# Patient Record
Sex: Male | Born: 1940 | Race: White | Hispanic: No | Smoking: Former smoker
Health system: Southern US, Community
[De-identification: ages and names within clinical notes are randomized; demographics above are authoritative.]

## PROBLEM LIST (undated history)

## (undated) DIAGNOSIS — E785 Hyperlipidemia, unspecified: Secondary | ICD-10-CM

## (undated) DIAGNOSIS — N210 Calculus in bladder: Secondary | ICD-10-CM

## (undated) DIAGNOSIS — K219 Gastro-esophageal reflux disease without esophagitis: Secondary | ICD-10-CM

## (undated) DIAGNOSIS — K8020 Calculus of gallbladder without cholecystitis without obstruction: Secondary | ICD-10-CM

## (undated) DIAGNOSIS — C801 Malignant (primary) neoplasm, unspecified: Secondary | ICD-10-CM

## (undated) DIAGNOSIS — C44221 Squamous cell carcinoma of skin of unspecified ear and external auricular canal: Secondary | ICD-10-CM

## (undated) DIAGNOSIS — I219 Acute myocardial infarction, unspecified: Secondary | ICD-10-CM

## (undated) DIAGNOSIS — J189 Pneumonia, unspecified organism: Secondary | ICD-10-CM

## (undated) DIAGNOSIS — N289 Disorder of kidney and ureter, unspecified: Secondary | ICD-10-CM

## (undated) DIAGNOSIS — Z87442 Personal history of urinary calculi: Secondary | ICD-10-CM

## (undated) DIAGNOSIS — I82409 Acute embolism and thrombosis of unspecified deep veins of unspecified lower extremity: Secondary | ICD-10-CM

## (undated) DIAGNOSIS — I10 Essential (primary) hypertension: Secondary | ICD-10-CM

## (undated) DIAGNOSIS — I83899 Varicose veins of unspecified lower extremities with other complications: Secondary | ICD-10-CM

## (undated) DIAGNOSIS — R21 Rash and other nonspecific skin eruption: Secondary | ICD-10-CM

## (undated) DIAGNOSIS — I2510 Atherosclerotic heart disease of native coronary artery without angina pectoris: Secondary | ICD-10-CM

## (undated) HISTORY — PX: COLONOSCOPY: SHX174

## (undated) HISTORY — DX: Acute myocardial infarction, unspecified: I21.9

## (undated) HISTORY — DX: Atherosclerotic heart disease of native coronary artery without angina pectoris: I25.10

## (undated) HISTORY — PX: DOPPLER ECHOCARDIOGRAPHY: SHX263

## (undated) HISTORY — DX: Essential (primary) hypertension: I10

## (undated) HISTORY — DX: Hyperlipidemia, unspecified: E78.5

## (undated) SURGERY — Surgical Case
Anesthesia: *Unknown

---

## 1997-11-10 ENCOUNTER — Emergency Department (HOSPITAL_COMMUNITY): Admission: EM | Admit: 1997-11-10 | Discharge: 1997-11-10 | Payer: Self-pay | Admitting: Emergency Medicine

## 2001-09-20 ENCOUNTER — Emergency Department (HOSPITAL_COMMUNITY): Admission: EM | Admit: 2001-09-20 | Discharge: 2001-09-20 | Payer: Self-pay | Admitting: Emergency Medicine

## 2001-10-08 ENCOUNTER — Emergency Department (HOSPITAL_COMMUNITY): Admission: EM | Admit: 2001-10-08 | Discharge: 2001-10-08 | Payer: Self-pay | Admitting: Emergency Medicine

## 2001-10-08 ENCOUNTER — Encounter: Payer: Self-pay | Admitting: Emergency Medicine

## 2003-05-14 ENCOUNTER — Emergency Department (HOSPITAL_COMMUNITY): Admission: AD | Admit: 2003-05-14 | Discharge: 2003-05-14 | Payer: Self-pay | Admitting: Family Medicine

## 2003-08-23 ENCOUNTER — Emergency Department (HOSPITAL_COMMUNITY): Admission: EM | Admit: 2003-08-23 | Discharge: 2003-08-23 | Payer: Self-pay | Admitting: *Deleted

## 2003-09-07 ENCOUNTER — Emergency Department (HOSPITAL_COMMUNITY): Admission: EM | Admit: 2003-09-07 | Discharge: 2003-09-08 | Payer: Self-pay | Admitting: Emergency Medicine

## 2003-11-16 ENCOUNTER — Emergency Department (HOSPITAL_COMMUNITY): Admission: EM | Admit: 2003-11-16 | Discharge: 2003-11-16 | Payer: Self-pay | Admitting: Family Medicine

## 2005-08-16 ENCOUNTER — Emergency Department (HOSPITAL_COMMUNITY): Admission: EM | Admit: 2005-08-16 | Discharge: 2005-08-17 | Payer: Self-pay | Admitting: Emergency Medicine

## 2005-09-09 ENCOUNTER — Emergency Department (HOSPITAL_COMMUNITY): Admission: EM | Admit: 2005-09-09 | Discharge: 2005-09-09 | Payer: Self-pay | Admitting: Emergency Medicine

## 2006-03-16 ENCOUNTER — Emergency Department (HOSPITAL_COMMUNITY): Admission: EM | Admit: 2006-03-16 | Discharge: 2006-03-16 | Payer: Self-pay | Admitting: *Deleted

## 2006-08-12 ENCOUNTER — Emergency Department (HOSPITAL_COMMUNITY): Admission: EM | Admit: 2006-08-12 | Discharge: 2006-08-12 | Payer: Self-pay | Admitting: Emergency Medicine

## 2007-03-20 ENCOUNTER — Emergency Department (HOSPITAL_COMMUNITY): Admission: EM | Admit: 2007-03-20 | Discharge: 2007-03-20 | Payer: Self-pay | Admitting: Emergency Medicine

## 2007-10-16 ENCOUNTER — Emergency Department (HOSPITAL_COMMUNITY): Admission: EM | Admit: 2007-10-16 | Discharge: 2007-10-16 | Payer: Self-pay | Admitting: Emergency Medicine

## 2008-05-13 HISTORY — PX: CORONARY ANGIOPLASTY: SHX604

## 2009-03-31 ENCOUNTER — Emergency Department (HOSPITAL_COMMUNITY): Admission: EM | Admit: 2009-03-31 | Discharge: 2009-03-31 | Payer: Self-pay | Admitting: Emergency Medicine

## 2010-01-07 ENCOUNTER — Other Ambulatory Visit: Payer: Self-pay | Admitting: Emergency Medicine

## 2010-01-08 ENCOUNTER — Ambulatory Visit: Payer: Self-pay | Admitting: Cardiology

## 2010-01-08 ENCOUNTER — Inpatient Hospital Stay (HOSPITAL_COMMUNITY): Admission: EM | Admit: 2010-01-08 | Discharge: 2010-01-10 | Payer: Self-pay | Admitting: Emergency Medicine

## 2010-01-09 ENCOUNTER — Encounter: Payer: Self-pay | Admitting: Internal Medicine

## 2010-01-13 ENCOUNTER — Emergency Department (HOSPITAL_COMMUNITY): Admission: EM | Admit: 2010-01-13 | Discharge: 2010-01-14 | Payer: Self-pay | Admitting: Emergency Medicine

## 2010-01-18 ENCOUNTER — Ambulatory Visit: Payer: Self-pay | Admitting: Cardiovascular Disease

## 2010-01-18 DIAGNOSIS — E782 Mixed hyperlipidemia: Secondary | ICD-10-CM

## 2010-01-18 DIAGNOSIS — I2510 Atherosclerotic heart disease of native coronary artery without angina pectoris: Secondary | ICD-10-CM

## 2010-01-18 DIAGNOSIS — R5381 Other malaise: Secondary | ICD-10-CM

## 2010-01-18 DIAGNOSIS — R5383 Other fatigue: Secondary | ICD-10-CM

## 2010-01-18 DIAGNOSIS — F17200 Nicotine dependence, unspecified, uncomplicated: Secondary | ICD-10-CM | POA: Insufficient documentation

## 2010-01-25 ENCOUNTER — Telehealth (INDEPENDENT_AMBULATORY_CARE_PROVIDER_SITE_OTHER): Payer: Self-pay | Admitting: *Deleted

## 2010-01-26 ENCOUNTER — Telehealth: Payer: Self-pay | Admitting: Cardiovascular Disease

## 2010-01-30 ENCOUNTER — Telehealth: Payer: Self-pay | Admitting: Cardiovascular Disease

## 2010-01-31 ENCOUNTER — Encounter: Payer: Self-pay | Admitting: Cardiovascular Disease

## 2010-01-31 ENCOUNTER — Ambulatory Visit: Payer: Self-pay | Admitting: Cardiology

## 2010-01-31 DIAGNOSIS — R030 Elevated blood-pressure reading, without diagnosis of hypertension: Secondary | ICD-10-CM

## 2010-02-02 ENCOUNTER — Ambulatory Visit: Payer: Self-pay | Admitting: Cardiology

## 2010-02-02 DIAGNOSIS — I10 Essential (primary) hypertension: Secondary | ICD-10-CM

## 2010-02-05 ENCOUNTER — Encounter (HOSPITAL_COMMUNITY): Admission: RE | Admit: 2010-02-05 | Discharge: 2010-02-09 | Payer: Self-pay | Admitting: Cardiovascular Disease

## 2010-02-07 ENCOUNTER — Telehealth (INDEPENDENT_AMBULATORY_CARE_PROVIDER_SITE_OTHER): Payer: Self-pay

## 2010-02-08 ENCOUNTER — Telehealth (INDEPENDENT_AMBULATORY_CARE_PROVIDER_SITE_OTHER): Payer: Self-pay

## 2010-02-10 ENCOUNTER — Encounter (HOSPITAL_COMMUNITY)
Admission: RE | Admit: 2010-02-10 | Discharge: 2010-03-12 | Payer: Self-pay | Source: Home / Self Care | Admitting: Cardiovascular Disease

## 2010-02-16 ENCOUNTER — Ambulatory Visit: Payer: Self-pay | Admitting: Cardiology

## 2010-02-16 DIAGNOSIS — R1084 Generalized abdominal pain: Secondary | ICD-10-CM

## 2010-02-20 ENCOUNTER — Telehealth (INDEPENDENT_AMBULATORY_CARE_PROVIDER_SITE_OTHER): Payer: Self-pay

## 2010-02-27 ENCOUNTER — Telehealth: Payer: Self-pay | Admitting: Cardiovascular Disease

## 2010-03-01 ENCOUNTER — Emergency Department (HOSPITAL_COMMUNITY): Admission: EM | Admit: 2010-03-01 | Discharge: 2010-03-01 | Payer: Self-pay | Admitting: Family Medicine

## 2010-03-02 ENCOUNTER — Telehealth (INDEPENDENT_AMBULATORY_CARE_PROVIDER_SITE_OTHER): Payer: Self-pay | Admitting: *Deleted

## 2010-03-10 ENCOUNTER — Emergency Department (HOSPITAL_COMMUNITY): Admission: EM | Admit: 2010-03-10 | Discharge: 2010-03-10 | Payer: Self-pay | Admitting: Emergency Medicine

## 2010-03-12 ENCOUNTER — Telehealth: Payer: Self-pay | Admitting: Cardiovascular Disease

## 2010-03-14 ENCOUNTER — Encounter (HOSPITAL_COMMUNITY)
Admission: RE | Admit: 2010-03-14 | Discharge: 2010-04-13 | Payer: Self-pay | Source: Home / Self Care | Admitting: Cardiovascular Disease

## 2010-03-16 ENCOUNTER — Encounter: Payer: Self-pay | Admitting: Adult Health

## 2010-03-16 LAB — CONVERTED CEMR LAB
ALT: <8 units/L (ref 0–53)
Albumin: 4.3 g/dL (ref 3.5–5.2)
Cholesterol: 200 mg/dL (ref 0–200)
HDL: 49 mg/dL (ref >39)
LDL Cholesterol: 131 mg/dL — ABNORMAL HIGH (ref 0–99)
Total CHOL/HDL Ratio: 4.1
Total Protein: 7.2 g/dL (ref 6.0–8.3)
Triglycerides: 99 mg/dL (ref <150)
VLDL: 20 mg/dL (ref 0–40)

## 2010-03-19 ENCOUNTER — Encounter: Payer: Self-pay | Admitting: Cardiovascular Disease

## 2010-03-20 ENCOUNTER — Ambulatory Visit: Payer: Self-pay | Admitting: Cardiovascular Disease

## 2010-03-22 ENCOUNTER — Encounter: Payer: Self-pay | Admitting: Cardiovascular Disease

## 2010-03-26 ENCOUNTER — Telehealth (INDEPENDENT_AMBULATORY_CARE_PROVIDER_SITE_OTHER): Payer: Self-pay

## 2010-04-09 ENCOUNTER — Emergency Department (HOSPITAL_COMMUNITY): Admission: EM | Admit: 2010-04-09 | Discharge: 2010-04-09 | Payer: Self-pay | Admitting: Emergency Medicine

## 2010-04-13 ENCOUNTER — Telehealth (INDEPENDENT_AMBULATORY_CARE_PROVIDER_SITE_OTHER): Payer: Self-pay | Admitting: *Deleted

## 2010-04-13 ENCOUNTER — Encounter: Payer: Self-pay | Admitting: Cardiovascular Disease

## 2010-04-13 ENCOUNTER — Encounter (HOSPITAL_COMMUNITY)
Admission: RE | Admit: 2010-04-13 | Discharge: 2010-05-13 | Payer: Self-pay | Source: Home / Self Care | Attending: Cardiovascular Disease | Admitting: Cardiovascular Disease

## 2010-04-17 DIAGNOSIS — M159 Polyosteoarthritis, unspecified: Secondary | ICD-10-CM | POA: Insufficient documentation

## 2010-04-23 ENCOUNTER — Telehealth (INDEPENDENT_AMBULATORY_CARE_PROVIDER_SITE_OTHER): Payer: Self-pay | Admitting: *Deleted

## 2010-05-01 ENCOUNTER — Telehealth: Payer: Self-pay | Admitting: Cardiovascular Disease

## 2010-06-12 NOTE — Progress Notes (Signed)
Summary: Refill  Phone Note Call from Patient   Caller: Patient Reason for Call: Refill Medication Summary of Call: patient needs refill on Effient and Crestor called into [facility]/tg Initial call taken by: Raechel Ache [facility],  February 07, 2010 1:25 PM  Follow-up for Phone Call        RX's at [facility]. waiting for pick up. Pt. aware. Follow-up by: Larita Fife Via LPN,  February 07, 2010 2:10 PM

## 2010-06-12 NOTE — Assessment & Plan Note (Signed)
Summary: per Wynona Canes @ [place] office/per Dr.Nishan/tg   Visit Type:  Follow-up Primary Provider:  None   History of Present Illness: 70 year old male, patient of Dr. Eden Emms, added onto my schedule today to followup on recent symptoms. Recent phone notes as well as discharge summary and office followup reviewed.  He is status post BMS to RCA in the setting of an ACS, preceded by description of throat "pressure" as if were "closing up." He tolerated this well. Does have residual disease, most significant in the distal RCA.  He reports having some recurrent symptoms since discharge. He specifically states that after he takes his Toprol-XL in the morning, he begins to feel dizzy, and then experiences a different sensation than pre-intervention, with a "funny feeling" in his upper chest to lower throat area. During the rest of the day, he begins to feel better, and specifically does not endorse any exertional neck discomfort with activity. He presented to cardiac rehabilitation the other day, found to be significantly hypertensive, although had not yet taken his Toprol-XL. He is noted to be bradycardic at rest.  Otherwise he reports compliance with dual antiplatelet therapy. He states that he has generally been feeling better over the last 2 days. No cough, fevers chills, or hemoptysis.  Current Medications (verified): 1)  Aspir-Low 81 Mg Tbec (Aspirin) .... Take 1 Tab Daily 2)  Metoprolol Succinate 25 Mg Xr24h-Tab (Metoprolol Succinate) .... Take 1/2 Tablet By Mouth Once Daily 3)  Crestor 20 Mg Tabs (Rosuvastatin Calcium) .... Take 1 Tab Daily 4)  Effient 10 Mg Tabs (Prasugrel Hcl) .... Take 1 Tab Daily 5)  Nitrostat 0.4 Mg Subl (Nitroglycerin) .... Take As Needed For Chestpain 6)  Norvasc 2.5 Mg Tabs (Amlodipine Besylate) .... Take 1 Tablet By Mouth Once Daily  Allergies (verified): 1)  ! Penicillin  Past History:  Social History: Last updated: 02/02/2010 Full Time Married  Tobacco  Use - Yes Alcohol Use - no Regular Exercise - no Drug Use - no  Past Medical History: CAD - BMS RCA 8/11, LVEF 50-55% Hyperlipidemia Hypertension Myocardial Infarction - NSTEMI 8/11 Gout  Past Surgical History: Unremarkable  Family History: Father: deceased age 53 unknown causes Mother: deceased at sge 64 unknown causes  Social History: Full Time Married  Tobacco Use - Yes Alcohol Use - no Regular Exercise - no Drug Use - no  Review of Systems  The patient denies anorexia, fever, chest pain, syncope, peripheral edema, prolonged cough, melena, and hematochezia.         Some bruising on his forearms, no major bleeding problems. Otherwise reviewed and negative.  Vital Signs:  Patient profile:   70 year old male Weight:      159 pounds BMI:     22.90 Pulse rate:   56 / minute BP sitting:   150 / 66  (right arm)  Vitals Entered By: Dreama Saa, CNA (February 02, 2010 1:05 PM)  Physical Exam  Additional Exam:  Normally nourished appearing male in no acute distress. HEENT: Conjunctiva and lids normal, oropharynx clear with poor dentition. Neck: Supple, no elevated JVP or loud bruits. Lungs: Clear to auscultation, diminished, no wheezing. Cardiac: Regular rate and rhythm, no S3 or pericardial rub. Abdomen: Soft, nontender, bowel sounds present. Skin: Warm and dry, few ecchymoses on the forearms. Extremities: No pitting edema. Musculoskeletal: No kyphosis. Neuropsychiatric: Alert and oriented x3, affect appropriate.   Cardiac Cath  Procedure date:  01/09/2010  Findings:      Left main was normal.  LAD was a moderate-sized vessel gave off a large diagonal branch.  There   was a diffuse 30-40% stenosis throughout the mid-to-distal LAD.  In the   ostium of the diagonal, there was approximately 40% stenosis.      Left circumflex gave off a large OM-1 and a small OM-2.  There was 20%   lesion in the proximal AV groove circumflex and a 50% lesion in the    proximal portion of the OM-1.      Right coronary artery was a large dominant vessel, had a 40% lesion   proximally followed by a 99% ruptured plaque in the proximal to   midvessel, this was followed by a 40% lesion distally.  There was a 70%   lesion in the distal RCA just after the takeoff of the PDA.  In the mid   PDA, there was a 60% lesion.  These were not flow-limiting.      Left ventriculogram done in the RAO position showed an EF of   approximately 50-55%.  There was some hypokinesis of the mid-to-distal   inferior wall.  No significant mitral regurgitation.   EKG  Procedure date:  02/02/2010  Findings:      Sinus bradycardia at 49 beats per minute with left atrial enlargement, otherwise no acute ST segment changes.  Impression & Recommendations:  Problem # 1:  CORONARY ATHEROSCLEROSIS NATIVE CORONARY ARTERY (ICD-414.01)  Patient is in general clinically stable at this point, with nonacute electrocardiogram. He has had some recent symptoms, detailed above, although not clearly similar to his pre-intervention symptoms. It may well be that Toprol-XL is limiting him functionally and bringing on some of the symptoms related to symptomatic bradycardia. Blood pressure does not appear to be well-controlled as yet either. He otherwise reports compliance with dual antiplatelet therapy, and the likelihood of acute stent thrombosis or other post-intervention complication seems fairly low at this point. Plan is to reduce Toprol-XL to 12.5 mg daily, possibly ultimately discontinuing it, and initiate Norvasc for better blood pressure control, potentially also as an antianginal with residual disease. Followup will be arranged in the next 2 weeks with Ms. Lawrence, sooner if needed.  His updated medication list for this problem includes:    Aspir-low 81 Mg Tbec (Aspirin) .Marland Kitchen... Take 1 tab daily    Metoprolol Succinate 25 Mg Xr24h-tab (Metoprolol succinate) .Marland Kitchen... Take 1/2 tablet by mouth once  daily    Effient 10 Mg Tabs (Prasugrel hcl) .Marland Kitchen... Take 1 tab daily    Nitrostat 0.4 Mg Subl (Nitroglycerin) .Marland Kitchen... Take as needed for chestpain    Norvasc 2.5 Mg Tabs (Amlodipine besylate) .Marland Kitchen... Take 1 tablet by mouth once daily  Problem # 2:  SMOKER (ICD-305.1)  Smoking cessation again discussed, and recommended.  Problem # 3:  ESSENTIAL HYPERTENSION, BENIGN (ICD-401.1)  Blood pressure not well controlled. Plan to initiate Norvasc beginning at 2.5 mg daily, and titrate from there.  His updated medication list for this problem includes:    Aspir-low 81 Mg Tbec (Aspirin) .Marland Kitchen... Take 1 tab daily    Metoprolol Succinate 25 Mg Xr24h-tab (Metoprolol succinate) .Marland Kitchen... Take 1/2 tablet by mouth once daily    Norvasc 2.5 Mg Tabs (Amlodipine besylate) .Marland Kitchen... Take 1 tablet by mouth once daily  Patient Instructions: 1)  Your physician recommends that you schedule a follow-up appointment in: 2 weeks 2)  Your physician has recommended you make the following change in your medication: decrease Metoprolol to 12.5mg  by mouth once daily and start taking Norvasc  2.5mg  by mouth once daily  Prescriptions: NORVASC 2.5 MG TABS (AMLODIPINE BESYLATE) take 1 tablet by mouth once daily  #30 x 3   Entered by:   Larita Fife Via LPN   Authorized by:   Loreli Slot, MD, [facility]   Signed by:   Larita Fife Via LPN on 13/12/6576   Method used:   Electronically to        [facility] (retail)       [address]       [place], [place]  [address]       Ph: [phone]       Fax: [phone]   RxID:   [id number]   Appended Document: per Wynona Canes @ [place] office/per Dr.Nishan/tg This is not my patient  Appended Document: per Wynona Canes @ [place] office/per Dr.Nishan/tg Dr. Eden Emms saw this pt as a new pt in [place] on 01/18/10. She has f/u with him on 03/20/10 in [place].

## 2010-06-12 NOTE — Progress Notes (Signed)
Summary: PT WAS SEEN IN ED THIS WEEKEND FOR FEELING DIZZY  Phone Note Call from Patient Call back at Home Phone [phone]   Reason for Call: Talk to Nurse Summary of Call: PT WAS SEEN IN ED THIS WEEKEN AND WANTS LYNN TO KNOW THAT THEY COULD NOT FIND A REASON FOR HIM BEING DIZZY. Initial call taken by: Faythe Ghee,  March 12, 2010 10:15 AM  Follow-up for Phone Call        Harriett Sine, pt's wife, states that pt. was dizzy when he woke up Saturday morning and went to ER. Mrs. Harbeson also states that pt. is still refusing to take his meds as directed due to side effects. Mrs. Albornoz is  advised that if pt. does not take meds as directed he will be dismissed from our practice due to non-compliance. Follow-up by: Larita Fife Via LPN,  March 12, 2010 11:17 AM

## 2010-06-12 NOTE — Progress Notes (Signed)
  Quillian Quince to [facility] @ [phone] [facility]  March 02, 2010 11:22 AM

## 2010-06-12 NOTE — Progress Notes (Signed)
Summary: labs and dizziness  Phone Note Call from Patient   Caller: Patient Reason for Call: Talk to Nurse Summary of Call: S: Pt. walked into office this morning concerned about dizziness while walking on treadmill in cardiac rehab. Also, he would like to have labwork done.  B: On last OV of 02-02-10 with Dr. Diona Browner, pt. was started on Norvasc 2.5mg  once daily and Metoprolol was decreased to 12.5mg  once daily. A:  Pt. states that while in hosp. (discharged on 01-11-10 from [facility]) labs were drawn and he was told his choleterol was high and started on Crestor 20mg  by mouth at bedtime (lab results to follow). He would like to have labs drawn again to see if cholesterol is better, he states that no MD's have checked bloodwork on him  in a long time. DISCHARGE LABORATORIES:  Sodium 138, potassium 3.5, chloride 108, bicarb 26, BUN 7, creatinine 0.98, calcium 8.6, cholesterol 199, triglycerides 112, HDL 39, LDL of 138.  WBCs 9.4, hemoglobin 14.4, hematocrit 41, platelets 161. FYI: BP at this time is 189/82 pt. states he just took meds 15 mins. before BP was checked. Also, he has f/u with K.Lawrence, NP on 10-7 and with Dr. Eden Emms on 11-8 R: Pt. advised not to look down at treadmill while walking to reduce/eliminate dizziness while in cardiac rehab. Also, pt. advised that we will call him with Dr. Fabio Bering recommendations. Initial call taken by: Larita Fife Via LPN,  February 08, 2010 1:23 PM  Follow-up for Phone Call        Continue current meds.  Too early to check labs.  Can have lft's and cholesterol when he has F/U with Harrold Donath Follow-up by: Colon Branch, MD, [facility],  February 09, 2010 4:07 PM  Additional Follow-up for Phone Call Additional follow up Details #1::        Pt. advised, he states he understamds info. given.  Additional Follow-up by: Larita Fife Via LPN,  February 09, 2010 4:27 PM

## 2010-06-12 NOTE — Progress Notes (Signed)
Summary: RX QUESTIONS  Phone Note Call from Patient Call back at Work Phone [phone]   Caller: PT Reason for Call: Talk to Nurse Summary of Call: PT WANTS TO TALK TO LYNN ABOUT POSSIBLE REACTIONS TO HIM MEDICATIONS. Initial call taken by: Faythe Ghee,  March 26, 2010 10:50 AM  Follow-up for Phone Call        Phone Call Completed. New medications from appt. with Dr. Eden Emms on 11-8 added to med list. Pt. wanted to give this office an update since he was seen in [place] on last office last.  Follow-up by: Larita Fife Via LPN,  March 26, 2010 4:41 PM    New/Updated Medications: EFFIENT 10 MG TABS (PRASUGREL HCL) take 1 tablet by mouth once daily CRESTOR 10 MG TABS (ROSUVASTATIN CALCIUM) take 1 tablet by mouth at bedtime

## 2010-06-12 NOTE — Progress Notes (Signed)
Summary: PT WANTS TO STOP TAKING MEDS  Phone Note Call from Patient Call back at Home Phone [phone]   Caller: PT WALK IN Reason for Call: Talk to Nurse Summary of Call: WOULD LIKE KATHRYN LAWRENCE TO KNOW THAT HE WANTS TO STOP TAKING EFFENT AND ASPRIN. HE STATES THAT HE HAS NOT TAKEN THEM IN A COUIPLE DAYS.  Initial call taken by: Faythe Ghee,  February 20, 2010 10:25 AM  Follow-up for Phone Call        Pt. states that he is taking too many medications. He is refusing to take Effient and ASA as he states "I read the side effects and I'm afraid that I might start bleeding from taking both blood thinners". Pt. encourage to continue taking meds as directed but again he refused.  Follow-up by: Larita Fife Via LPN,  February 20, 2010 10:59 AM  Additional Follow-up for Phone Call Additional follow up Details #1::        We have spent a great deal of time explaining to Mr. Lieser the necessity of taking his precribed medications. His refusal to do so is noted. Additional Follow-up by: Joni Reining NP

## 2010-06-12 NOTE — Progress Notes (Signed)
Summary: Wants to stop Metoprolol  Phone Note Call from Patient   Caller: Patient Reason for Call: Talk to Nurse Summary of Call: patient wants to know if he can stop taking Metoprolol / did not state why he wanted to stop / tg Initial call taken by: Donyell Ding [facility],  February 27, 2010 9:51 AM  Follow-up for Phone Call        Pt. states that Metoprolol is making him tired and his throat tight so he wants to stop taking. Wants to know if he should try a different BP med. Also, pt. called on 02-20-10 to let us know he had stopped taking Effient and ASA (see phone note from 02-20-10). Pt. encourged to continue taking meds as directed and to talk with Dr. Eden Emms concerning meds at appt. scheduled for 03-20-10. Follow-up by: Larita Fife Via LPN,  February 27, 2010 3:53 PM  Additional Follow-up for Phone Call Additional follow up Details #1::        Patinet is being ridiculous.  He had a BMS and wants to stop ASA and Effient.  He has had an MI and needs to be on low dose beta blocker.  If he cannot take his meds including antiplatlets he needs to see another cardiology group.  He will have another MI if he does not take his meds.   Additional Follow-up by: Colon Branch, MD, [facility],  March 01, 2010 11:01 AM    Additional Follow-up for Phone Call Additional follow up Details #2::    Pt. advised to take all of his medications as directed. Pt. states he will discuss med concerns with Dr. Eden Emms on next appt. on 11-8. Follow-up by: Larita Fife Via LPN,  March 01, 2010 11:37 AM

## 2010-06-12 NOTE — Progress Notes (Signed)
  Phone Note Other Incoming   Caller: pt walked into office Summary of Call: pt here in the office today concerned about the pain he had in his neck today. he states he was using the bathroom and had to strain very hard and developed a pressure on both sides of his neck. it lasted until he finished using the bathroom and then went away. he states this is not the same type pain he had prior to his stent. he denies chest pain with exerction or at rest. pt denies SOB. several questions answered for the pt and his wife. he will cont to watch and let us know if happens again and if he develops any symptoms with exerction or rest. bp150/60.Deliah Goody, RN  January 25, 2010 5:33 PM     Prescriptions: EFFIENT 10 MG TABS (PRASUGREL HCL) take 1 tab daily  #30 x 12   Entered by:   Deliah Goody, RN   Authorized by:   Colon Branch, MD, [facility]   Signed by:   Deliah Goody, RN on 01/25/2010   Method used:   Electronically to        [facility] (retail)       [address]       [place], [place]  [address]       Ph: [phone]       Fax: [phone]   RxID:   [id number]

## 2010-06-12 NOTE — Cardiovascular Report (Signed)
Summary: [facility]   Imported By: Roderic Ovens 01/26/2010 16:10:23  _____________________________________________________________________  External Attachment:    Type:   Image     Comment:   External Document

## 2010-06-12 NOTE — Assessment & Plan Note (Signed)
Summary: f2m/jml   Visit Type:  Follow-up Primary Provider:  None  CC:  some dizziness.  History of Present Illness: Angel [name] is a patient of Dr Antoine Poche who was cathed by Dr Teressa Lower in August with stent to the RCA by Dr Riley Kill for ruptured plaque in the RCA.  He lives in [place] and was scheduled for F/U with me.  He subsequently has seen Lorin Picket.  He has severe anxiety and has been noncompliant with his meds.  In particular he has not taken his Effient and Crestor.  I explained the reason for both medicines and the risk of stent thrombosis to him I dont known if he will be compliant going forward.  Doing cardiac rehab with improvement in BP on BB/norvasc.  No angina.    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 Take 1 Tab Daily 2)  Metoprolol Succinate 25 Mg Xr24h-Tab (Metoprolol Succinate) .... Take 1/2 Tablet By Mouth Once Daily 3)  Nitrostat 0.4 Mg Subl (Nitroglycerin) .... Take As Needed For Chestpain 4)  Amlodipine Besylate 2.5 Mg Tabs (Amlodipine Besylate) .... Take One Tablet By Mouth Daily  Allergies: 1)  ! Penicillin  Past History:  Past Medical History: Last updated: 02/08/10 CAD - BMS RCA 8/11, LVEF 50-55% Hyperlipidemia Hypertension Myocardial Infarction - NSTEMI 8/11 Gout  Past Surgical History: Last updated: 08-Feb-2010 Unremarkable  Family History: Last updated: 02-08-10 Father: deceased age 55 unknown causes Mother: deceased at sge 52 unknown causes  Social History: Last updated: 02/16/2010 Full Time Married  Tobacco Use - Quit X 1 month as of 02/16/2010 Alcohol Use - no Regular Exercise -  no Drug Use - no  Review of Systems       Denies fever, malais, weight loss, blurry vision, decreased visual acuity, cough, sputum, SOB, hemoptysis, pleuritic pain, palpitaitons, heartburn, abdominal pain, melena, lower extremity edema, claudication, or rash.   Vital Signs:  Patient profile:   70 year old male Height:      70 inches Weight:      160 pounds BMI:     23.04 Pulse rate:   64 / minute Pulse rhythm:   regular Resp:     18 per minute BP sitting:   130 / 68  (left arm) Cuff size:   large  Vitals Entered By: Vikki Ports (March 20, 2010 11:48 AM)  Physical Exam  General:  Affect appropriate Healthy:  appears stated age HEENT: normal Neck supple with no adenopathy JVP normal no bruits no thyromegaly Lungs clear with no wheezing and good diaphragmatic motion Heart:  S1/S2 no murmur,rub, gallop or click PMI normal Abdomen: benighn, BS positve, no tenderness, no AAA no bruit.  No HSM or HJR Distal pulses intact with no bruits No edema Neuro non-focal Skin warm and dry    Impression & Recommendations:  Problem # 1:  ESSENTIAL HYPERTENSION, BENIGN (ICD-401.1) Continue with meds may need to increase norvasc if he is compliant His updated medication list for this problem includes:    Aspir-low 81 Mg Tbec (Aspirin) .Marland Kitchen... Take 1 tab daily    Metoprolol Succinate 25 Mg Xr24h-tab (Metoprolol succinate) .Marland Kitchen... Take 1/2 tablet by mouth once daily    Amlodipine Besylate  2.5 Mg Tabs (Amlodipine besylate) .Marland Kitchen... Take one tablet by mouth daily  Problem # 2:  MIXED HYPERLIPIDEMIA (ICD-272.2) Resume crestor 10 mg.  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  Needs to be on Effient.  No dental work for at least 6 months if it requires stopping Effient although he hasn't been taking it anyway The following medications were removed from the medication list:    Effient  10 Mg Tabs (Prasugrel hcl) .Marland Kitchen... Take 1 tab daily His updated medication list for this problem includes:    Aspir-low 81 Mg Tbec (Aspirin) .Marland Kitchen... Take 1 tab daily    Metoprolol Succinate 25 Mg Xr24h-tab (Metoprolol succinate) .Marland Kitchen... Take 1/2 tablet by mouth once daily    Nitrostat 0.4 Mg Subl (Nitroglycerin) .Marland Kitchen... Take as needed for chestpain    Amlodipine Besylate 2.5 Mg Tabs (Amlodipine besylate) .Marland Kitchen... Take one tablet by mouth daily  Patient Instructions: 1)  Your physician recommends that you schedule a follow-up appointment in: 6 MONTHS WITH DR Eden Emms IN [place] 2)  Your physician has recommended you make the following change in your medication: RESTART EFFIENT 10 MG once daily AND 3)  CRESTOR 10 MG 1 once daily

## 2010-06-12 NOTE — Assessment & Plan Note (Signed)
Summary: NP6 POST CATH   Visit Type:  Initial Consult Primary Jaquil Todt:  nopmd  CC:  no cardiology complaints.  History of Present Illness: Angel [name] is seen as a new patient to me.  He was admitted by Dr Antoine Poche, cathed by Dr Teressa Lower and had PCI of his RCA by Dr Riley Kill.  on 01/09/10.  He had been smoking up until hospital admission.  He had been having throat pain as his anginal equivalent.  This has not recurred since D/C.  He had a BMS placed to the mid RCA with post dilatation to 4.25 mm.  He had residual distal RCA disease that TS wanted to Rx medically.  He has not smoked since D/C but has put unlit cigs in his mouth.  I counseled him for less than 10 minutes about continued abstinance.  He feels fatigued on his BB and was requesting a lower dose.  No SOB, palpitations or syncope.  Compliant with meds.  Cath done from right radial and hand is fine with no pain  Current Problems (verified): None  Current Medications (verified): 1)  Aspir-Low 81 Mg Tbec (Aspirin) .... Take 1 Tab Daily 2)  Metoprolol Tartrate 25 Mg Tabs (Metoprolol Tartrate) .... Take 1/ 2 Tab Two Times A Day(Will Start Toprol Xl 25mg  Daily When Current Supply Finished.) 3)  Crestor 20 Mg Tabs (Rosuvastatin Calcium) .... Take 1 Tab Daily 4)  Effient 10 Mg Tabs (Prasugrel Hcl) .... Take 1 Tab Daily 5)  Nitrostat 0.4 Mg Subl (Nitroglycerin) .... Take As Needed For Chestpain 6)  Toprol Xl 25 Mg Xr24h-Tab (Metoprolol Succinate) .... Take 1 Tablet By Mouth Once A Day(Start Once Lopressor Finished)  Allergies (verified): 1)  ! Penicillin  Past History:  Past Medical History: Last updated: February 06, 2010 coronary artery disease  tobacco abuse hyperlipidemia hypertension  Past Surgical History: Last updated: 02/06/10 cardiac cath  Family History: Last updated: February 06, 2010 Father:deceased age 92 unknown causes Mother:deceased at sge 66 unknown causes  Social History: Last updated: 2010/02/06 Full Time Married    Tobacco Use - Yes.  Alcohol Use - no Regular Exercise - no Drug Use - no  Review of Systems       Denies fever, malais, weight loss, blurry vision, decreased visual acuity, cough, sputum, SOB, hemoptysis, pleuritic pain, palpitaitons, heartburn, abdominal pain, melena, lower extremity edema, claudication, or rash.   Vital Signs:  Patient profile:   70 year old male Height:      70 inches Weight:      155 pounds BMI:     22.32 Pulse rate:   56 / minute BP sitting:   147 / 53  (right arm)  Vitals Entered By: Dreama Saa, CNA (January 18, 2010 10:12 AM)  Physical Exam  General:  Affect appropriate Healthy:  appears stated age HEENT: normal Neck supple with no adenopathy JVP normal no bruits no thyromegaly Lungs clear with no wheezing and good diaphragmatic motion Heart:  S1/S2 no murmur,rub, gallop or click PMI normal Abdomen: benighn, BS positve, no tenderness, no AAA no bruit.  No HSM or HJR Distal pulses intact with no bruits No edema Neuro non-focal Skin warm and dry    Impression & Recommendations:  Problem # 1:  CORONARY ATHEROSCLEROSIS NATIVE CORONARY ARTERY (ICD-414.01) Stable with no throat pain or angina. Continue antiplatlet Rx His updated medication list for this problem includes:    Aspir-low 81 Mg Tbec (Aspirin) .Marland Kitchen... Take 1 tab daily    Metoprolol Tartrate 25 Mg Tabs (Metoprolol tartrate) .Marland KitchenMarland KitchenMarland KitchenMarland Kitchen  Take 1/ 2 tab two times a day(will start toprol xl 25mg  daily when current supply finished.)    Effient 10 Mg Tabs (Prasugrel hcl) .Marland Kitchen... Take 1 tab daily    Nitrostat 0.4 Mg Subl (Nitroglycerin) .Marland Kitchen... Take as needed for chestpain    Toprol Xl 25 Mg Xr24h-tab (Metoprolol succinate) .Marland Kitchen... Take 1 tablet by mouth once a day(start once lopressor finished)  Problem # 2:  MIXED HYPERLIPIDEMIA (ICD-272.2) Continue statin with F/U labs in 6 months His updated medication list for this problem includes:    Crestor 20 Mg Tabs (Rosuvastatin calcium) .Marland Kitchen... Take 1 tab  daily  Problem # 3:  SMOKER (ICD-305.1) Contnue to abstain.  Will call in Welbutrin if needed  Problem # 4:  FATIGUE (ICD-780.79) Decrease lopresser to 12.5 two times a day and F/U in 8 weeks to see if this helps  Patient Instructions: 1)  Your physician has recommended you make the following change in your medication: DECREASE METOPROLOL TARTRATE TO 1/2 TWICE DAILY. Once your current supply is finished, START TOPROL XL 25MG  DAILY. Your new prescription has been sent to your pharmacy. 2)  Your physician recommends that you schedule a follow-up appointment in: 8 WEEKS WITH NISHAN.(EITHER [place] OR [place] OFFICE.) Prescriptions: TOPROL XL 25 MG XR24H-TAB (METOPROLOL SUCCINATE) Take 1 tablet by mouth once a day(start once lopressor finished)  #30 x 6   Entered by:   Carlye Grippe   Authorized by:   Colon Branch, MD, [facility]   Signed by:   Carlye Grippe on 01/18/2010   Method used:   Electronically to        [facility] (retail)       [address] [place], [place]  [address]       Ph: [phone]       Fax: [phone]   RxID:   [id number]   Appended Document: [facility]     Allergies: 1)  ! Penicillin   Other Orders: EKG w/ Interpretation (93000)

## 2010-06-12 NOTE — Progress Notes (Signed)
Summary: bp high  Phone Note Call from Patient   Caller: Patient [phone] Reason for Call: Talk to Nurse Summary of Call: pt has questions re high bp-pls call  Initial call taken by: Glynda Jaeger,  January 30, 2010 12:16 PM  Follow-up for Phone Call        Called patient back...he states that he went to orientation rehab at [facility] without taking his medication and his BP was 180/?Marland Kitchen Advised him to take his medication before going to rehab and the nurses will monitor his BP every time he is there and they will let us know there are any abnormal readings. He wanted to know he could try NTG if he has any neck or throat discomfort and I advised him that he could do this and let us know the outcome. He states that his "nerves are bad". Advised him to make follow up appointment with PCP to discuss this problem. Patient verbalized understanding. Layne Benton, RN, BSN  January 30, 2010 1:06 PM

## 2010-06-12 NOTE — Progress Notes (Signed)
Summary: pt having reaction to medication  Phone Note Call from Patient Call back at Home Phone [phone]   Caller: Patient Reason for Call: Talk to Nurse, Talk to Doctor Summary of Call: per pt ever time he takes metoprolol he gets a tightness and stiffness in his neck and he has other medcation questions Initial call taken by: Omer Jack,  January 26, 2010 8:31 AM  Follow-up for Phone Call        01/26/10--900am--pt calling c/o choking sensation in bilat neck area and tightness in neck --is wondering if caused by toprol(has not started toprol xl) , has NTG but has not tried--advised--go ahead and start toprol xl--if symtoms persist, try NTG, if choking sensation subsides he is advised to go to nearest ED, if not continue with prescribed meds --if condition persists please notify us--also states tightness in neck seems to be worse with straining for BM or stress--called pharmacy to have them add colace to med list, and give to pt when he comes to pick up toprol xl--pt agrees--i will phone him this pm to see how he is doing--nt Follow-up by: Ledon Snare, RN,  January 26, 2010 9:30 AM     Appended Document: pt having reaction to medication 01/26/10--1530pm--spoke with [name] who states he is feeling better with no reaction to toprol xl--no longer feeling neck tightness or choking sensation--again advised to take NTG and go to nearest ED if symptoms come back--pt agrees--nt  Appended Document: pt having reaction to medication His anginal equivalent was neck pain and he had residual distal RCA disease. Have him see [facility] or TS ASAP or DOD to evaluate.  May need recath.  He was seen by [facility] and TS in hospital .  Not sure why he F/U with me.  Appended Document: pt having reaction to medication SPOKE WITH PT C/O NECK PRESSURE WHILE BENDING OVER  AND WHEN C/O CONSTIPATION HAS NOT TRIED NTG AT THIS TIME  PT C/O B/P RUNNING HIGH WHILE AT REHAB ORIENTATION PT TO GO TO [place] OFFICE FOR B/P  CHECK TODAY  AND ALSO SCHEDULED F/U WITH DR MCDOWELL IN [place] OFF ON FRI 02/02/10 AT 1:20 PER DR Marlys Stegmaier REQUEST  TO BE EVALUATED AND TREATED RE NECK PAIN.

## 2010-06-12 NOTE — Assessment & Plan Note (Signed)
Summary: nurse visit per BP check/tg  Nurse Visit   Current Medications (verified): 1)  Aspir-Low 81 Mg Tbec (Aspirin) .... Take 1 Tab Daily 2)  Metoprolol Succinate 25 Mg Xr24h-Tab (Metoprolol Succinate) .... Take 1 Tablet By Mouth Once A Day 3)  Crestor 20 Mg Tabs (Rosuvastatin Calcium) .... Take 1 Tab Daily 4)  Effient 10 Mg Tabs (Prasugrel Hcl) .... Take 1 Tab Daily 5)  Nitrostat 0.4 Mg Subl (Nitroglycerin) .... Take As Needed For Chestpain  Allergies (verified): 1)  ! Penicillin  Visit Type:  2week nurse visit Primary Provider:  no PCP   History of Present Illness: S:  pt went to his initial consultation for cardiac rehab, at this interview pt's bp was 200/63 B: ov on 01/18/10, decrease metroprolol to 25mg  daily A: pt c/o pain in neck and feelings on something in his throat, no documentation of a carotid US, ekg perfomed showed sinus bradycardia   Impression & Recommendations:  Problem # 1:  ELEVATED BLOOD PRESSURE (ICD-796.2)  Patient of Dr. Eden Emms, recently seen on 8 September. He is status post recent BMS to RCA, otherwise managed medically. Prior anginal symptoms included throat pain. Records indicate that he made phone contact with the office on 16 September indicating feeling of neck tightness bilaterally when taking Toprol-XL. Followup phone contact on 20 September indicates that the patient was noted to be hypertensive at presentation to cardiac rehabilitation in the absence of morning medications. Also patient questioned as to whether nitroglycerin could be used for neck discomfort, and also mentioned "nerves are bad." It appears that Dr. Eden Emms arranged to have the patient come into the [place] office for a nurse blood pressure check today, noted above. Systolic blood pressure in the 150s here without active neck discomfort. I was able to subsequently review the patient's electrocardiogram after he had already left which is normal showing sinus bradycardia. He has been  scheduled to see me on Friday. I asked nursing to call the patient and make sure he understood to use nitroglycerin as directed, and if his symptoms worsened prior to office evaluation, seek urgent medical attention.  Appended Document: nurse visit per BP check/tg I spoke with pt and spouse on 01/31/10 and discussed in detail how to use nitroglycerin and reasons to go to the ED,  I spoke with pt this am.  He denies having any neck pain or feelings of his throat closing, no other c/o at this time reiterated how  and when to use  nito, pt verbalized a better understanding today.

## 2010-06-12 NOTE — Assessment & Plan Note (Signed)
Summary: 2 wk f/u per checkout on 02/02/10/tg   Visit Type:  Follow-up Primary Provider:  None   History of Present Illness: 70 y/o CM with known history of CAD with NSTEMI and unstable angina with his anginal equivalent as neck and throat pain.  Catherization completed in August of 2011 demonstrated a thrombotic ruptured plaque in the midportion of the RCA which was subsequently stented with BMS.  He was placed on ASA, Effient, Crestor, Toprol.  He was seen on follow-up by Dr. Diona Browner on 02/02/2010 as an add-on because of recurrent pressure in his throat.  He was found to be bradycardic and mildly hypertensive at that visit.  Dr. Diona Browner decreased the toprol to 12.5 mg daily and began the patient on Norvasc 2.5mg  daily.  He is here for follow-up.  The patient states his "throat pressure"  has been eliminated with these medication changes, however, he is noticing that when he takes crestor he feels stomach discomfort and constipation thereafter.  He has tried changing the dosing times to later in the day, with and without food, and he has found no relief. He therefore stopped taking crestor a couple of days ago.  He continues to take his other medications as directed and continues with cardiac rehab.  Current Medications (verified): 1)  Aspir-Low 81 Mg Tbec (Aspirin) .... Take 1 Tab Daily 2)  Metoprolol Succinate 25 Mg Xr24h-Tab (Metoprolol Succinate) .... Take 1/2 Tablet By Mouth Once Daily 3)  Pravachol 40 Mg Tabs (Pravastatin Sodium) .... Take 1 Tablet By Mouth At Bedtime 4)  Effient 10 Mg Tabs (Prasugrel Hcl) .... Take 1 Tab Daily 5)  Nitrostat 0.4 Mg Subl (Nitroglycerin) .... Take As Needed For Chestpain 6)  Norvasc 5 Mg Tabs (Amlodipine Besylate) .... Take 1 Tablet By Mouth Once Daily  Allergies (verified): 1)  ! Penicillin  Comments:  Nurse/Medical Assistant: patient reviewed med list from previous ov and stated that meds are correct also brought med bottles patient wants crestor  reduced states it is to strong  for him.  Past History:  Past medical, surgical, family and social histories (including risk factors) reviewed, and no changes noted (except as noted below).  Past Medical History: Reviewed history from 02/02/2010 and no changes required. CAD - BMS RCA 8/11, LVEF 50-55% Hyperlipidemia Hypertension Myocardial Infarction - NSTEMI 8/11 Gout  Past Surgical History: Reviewed history from 02/02/2010 and no changes required. Unremarkable  Family History: Reviewed history from 02/02/2010 and no changes required. Father: deceased age 35 unknown causes Mother: deceased at sge 59 unknown causes  Social History: Full Time Married  Tobacco Use - Quit X 1 month as of 02/16/2010 Alcohol Use - no Regular Exercise - no Drug Use - no  Review of Systems       Abdominal discomfort with Crestor.  All other systems have been reviewed and are negative unless stated above.   Vital Signs:  Patient profile:   70 year old male Weight:      157 pounds BMI:     22.61 Pulse rate:   58 / minute BP sitting:   144 / 63  (right arm)  Vitals Entered By: Dreama Saa, CNA (February 16, 2010 11:37 AM)  Physical Exam  General:  Well developed, well nourished, in no acute distress. Lungs:  Clear bilaterally to auscultation and percussion. Heart:  Non-displaced PMI, chest non-tender; regular rate and rhythm, S1, S2 without murmurs, rubs or gallops. Carotid upstroke normal, no bruit. Normal abdominal aortic size, no bruits. Femorals  normal pulses, no bruits. Pedals normal pulses. No edema, no varicosities. Abdomen:  Bowel sounds positive; abdomen soft and non-tender without masses, organomegaly, or hernias noted. No hepatosplenomegaly. Msk:  Back normal, normal gait. Muscle strength and tone normal. Pulses:  pulses normal in all 4 extremities Extremities:  No clubbing or cyanosis. Neurologic:  Alert and oriented x 3. Psych:  Normal affect.   Impression &  Recommendations:  Problem # 1:  ABDOMINAL PAIN-GENERALIZED (ICD-789.07) He may be intolerant to Crestor as he says the stomach symptoms seemed to improve when he stopped it.  I have changed his crestor to pravachol 40mg  p.o. daily to evaluate if this will be better tolerated.  Will repeat Lipids and LFT's in one month prior to follow-up with Dr. Eden Emms  Problem # 2:  ESSENTIAL HYPERTENSION, BENIGN (ICD-401.1) Blood pressure is better but no optimally controlled in a patient with CAD/NSTEMI.  Will increase norvasc to 5mg  daily. Watch HR on this. I am uncertain why he is not on ACE inhibitor in this setting with normal BUN and Creatnine.  This will be addressed by Dr. Eden Emms on follow-up appointment.  Right now will continue to titrate the norvasc.  If his HR decreases with history of bradycardic on last visit would recommend changing to ACE inhibitor. His updated medication list for this problem includes:    Aspir-low 81 Mg Tbec (Aspirin) .Marland Kitchen... Take 1 tab daily    Metoprolol Succinate 25 Mg Xr24h-tab (Metoprolol succinate) .Marland Kitchen... Take 1/2 tablet by mouth once daily    Norvasc 5 Mg Tabs (Amlodipine besylate) .Marland Kitchen... Take 1 tablet by mouth once daily  Orders: Durable Medical Equipment (DME)  Problem # 3:  CORONARY ATHEROSCLEROSIS NATIVE CORONARY ARTERY (ICD-414.01) He is asymptomatic from this standpoint with no anginal equivalent that he expressed on presentation with MI.  He will continue Effient for another month with need to discontinue to be discussed by Dr. Eden Emms. His updated medication list for this problem includes:    Aspir-low 81 Mg Tbec (Aspirin) .Marland Kitchen... Take 1 tab daily    Metoprolol Succinate 25 Mg Xr24h-tab (Metoprolol succinate) .Marland Kitchen... Take 1/2 tablet by mouth once daily    Effient 10 Mg Tabs (Prasugrel hcl) .Marland Kitchen... Take 1 tab daily    Nitrostat 0.4 Mg Subl (Nitroglycerin) .Marland Kitchen... Take as needed for chestpain    Norvasc 5 Mg Tabs (Amlodipine besylate) .Marland Kitchen... Take 1 tablet by mouth once  daily  Other Orders: Future Orders: T-Lipid Profile ([id number]) ... 03/16/2010 T-Hepatic Function [phone]) ... 03/16/2010  Patient Instructions: 1)  Your physician recommends that you schedule a follow-up appointment in: March 20, 2010 with Dr. Eden Emms at [facility] office 2)  Your physician recommends that you return for lab work in: just before next visit  on 03-16-10 3)  Your physician has recommended you make the following change in your medication: Stop taking Crestor and start taking Pravachol 40mg  by mouth at bedtime and increase Norvasc to 5mg  by mouth once daily 4)  Your physician recommends that you obtain a Blood pressure cuff. We have given you a prescriptions for this today Prescriptions: NORVASC 5 MG TABS (AMLODIPINE BESYLATE) take 1 tablet by mouth once daily  #30 x 3   Entered by:   Larita Fife Via LPN   Authorized by:   Joni Reining, NP   Signed by:   Larita Fife Via LPN on 84/69/6295   Method used:   Electronically to        [facility] (retail)       [address]       [place], [place]  [address]       Ph: [phone]       Fax: [phone]   RxID:   [id number] PRAVACHOL 40 MG TABS (PRAVASTATIN SODIUM) take 1 tablet by mouth at bedtime  #30 x 3   Entered by:   Larita Fife Via LPN   Authorized by:   Joni Reining, NP   Signed by:   Larita Fife Via LPN on 84/13/2440   Method used:   Electronically to        [facility] (retail)       [address] [place], [place]  [address]       Ph: [phone]       Fax: [phone]   RxID:   [id number]   Appended Document: 2 wk f/u per checkout on 02/02/10/tg Mr. Tumlin came by the office today to discuss his concerns about starting Pravachol. I had discontinued Crestor as he was complaining of myalgia pain.  We decided to try Pravachol that was prescribed at 40mg  daily. Mr. Niazi is having second thoughts about taking this medication.  So much so  that he has become very anxious about it.  He states that he is frightened of the side effects.  He also mentioned that he is afraid of the side effects of Effient.  He reads the package inserts and monitors himself for any symptoms.   I have discussed with him the importance of a medication to reduce cholesterol in the setting of his CAD. However, if taking the medication is anxiety producing, it is okay not to take it. I have told him that there are alternatives such as fish oil, niacin etc.   This can be discussed further with Dr. Eden Emms on follow-up.  He verbalized understanding.  office visit previously scheduled.

## 2010-06-12 NOTE — Progress Notes (Signed)
  Phone Note Outgoing Call   Call placed by: Scherrie Bateman, LPN,  April 13, 2010 2:44 PM Call placed to: Patient Summary of Call: PT DROPPED OFF NOTE RE UNABLE TO TOLERATE CRESTOR PER DR NISHAN MAY TAKE PRAVACHOL 5 MG IF REFUSES THEN NEEDS TO BE REFERRRED TO LIPID CLINIC ./CY  Follow-up for Phone Call        Phone call completed PT AWARE OF MED CHANGE AND THE NEED FOR   REPEAT LAB WORK IN FEB  2012. Follow-up by: Scherrie Bateman, LPN,  April 13, 2010 3:07 PM    New/Updated Medications: PRAVASTATIN SODIUM 10 MG TABS (PRAVASTATIN SODIUM) 1/2 TAB once daily Prescriptions: PRAVASTATIN SODIUM 10 MG TABS (PRAVASTATIN SODIUM) 1/2 TAB once daily  #30 x 11   Entered by:   Scherrie Bateman, LPN   Authorized by:   Colon Branch, MD, [facility]   Signed by:   Scherrie Bateman, LPN on 16/02/9603   Method used:   Electronically to        [facility] (retail)       [address]       [place], [place]  [address]       Ph: [phone]       Fax: [phone]   RxID:   [id number]

## 2010-06-14 NOTE — Progress Notes (Signed)
Summary: calling regarding medications (PN to review)  Phone Note Call from Patient Call back at [facility] Phone [phone] Call back at Work Phone [phone]   Caller: Patient Summary of Call: Pt regarding medication Initial call taken by: Judie Grieve,  May 01, 2010 11:34 AM  Follow-up for Phone Call        I called and spoke with the pt. He states he was started on omeprazole 20mg  once daily by Dr. Duanne Guess for acid reflux. He has been experiencing left rib numbness, which Dr. Duanne Guess feels is related to acid reflux. The pt states he has been nervous to take the omeprazole which was given to him last week, but he didn't start this until today. He is holding his pravastatin b/c of GI upset, but he is going to try to restart this tonight. He also wanted to know if Dr. Eden Emms would consider weaning him off of his metoprolol. He states that going in to rehab, his HR is 60bpm, and his SBP may run from 150-170 mmHg. He states he feels "slowed down" and does have some occasional dizziness in rehab. He is also concerned about being on the Effient and wanted to know if he could be on a lower dose. I explained that with his history of MI and DES, that Dr. Eden Emms would most likely not recommend that he come off that drug, and I explained that 10mg  is the standard dose. I also advised that his metoprolol is helping with HR and BP control, but also providing extra protection to his heart muscle. I explained I would relay his concerns to Dr. Eden Emms, but my thought is that he would not be likely to change his meds. We will call the pt back after Dr. Eden Emms reviews. Follow-up by: Sherri Rad, RN, BSN,  May 01, 2010 11:41 AM  Additional Follow-up for Phone Call Additional follow up Details #1::        continue current meds.  He doesn't want to take anything and has a sig anxiety issue

## 2010-06-14 NOTE — Miscellaneous (Signed)
Summary: [name] Cardiac Progress Report   [name] Cardiac Progress Report   Imported By: Roderic Ovens 04/25/2010 10:50:03  _____________________________________________________________________  External Attachment:    Type:   Image     Comment:   External Document

## 2010-06-14 NOTE — Progress Notes (Signed)
Summary: PT PUT ON NEW MEDS  Phone Note Call from Patient Call back at Home Phone [phone]   Caller: PT Reason for Call: Talk to Nurse Summary of Call: PT WAS JUST PUT ON A NEW MED AMEPRAZOLE 20MG  1 CAPSULE A DAY. HE WANTS TO MAKE SURE THIS IS OK FOR HIM TO BE ON. DR Duanne Guess IS WHO PUT HIM ON IT. Initial call taken by: Faythe Ghee,  April 23, 2010 10:22 AM  Follow-up for Phone Call        this medicaiton is fine Follow-up by: Teressa Lower RN,  April 23, 2010 5:04 PM

## 2010-06-14 NOTE — Letter (Signed)
Summary: [facility] - Walk-In Pt Form   - Walk-In Pt Form   Imported By: Marylou Mccoy 04/30/2010 15:03:18  _____________________________________________________________________  External Attachment:    Type:   Image     Comment:   External Document

## 2010-06-15 ENCOUNTER — Ambulatory Visit: Payer: Medicare Other | Admitting: Urology

## 2010-06-15 ENCOUNTER — Ambulatory Visit: Admit: 2010-06-15 | Payer: Self-pay | Admitting: Cardiovascular Disease

## 2010-06-15 ENCOUNTER — Other Ambulatory Visit: Payer: Self-pay

## 2010-06-15 DIAGNOSIS — N400 Enlarged prostate without lower urinary tract symptoms: Secondary | ICD-10-CM

## 2010-06-15 DIAGNOSIS — R972 Elevated prostate specific antigen [PSA]: Secondary | ICD-10-CM

## 2010-06-15 NOTE — Miscellaneous (Signed)
Summary: [name] Cardiac Session Report   [name] Cardiac Session Report   Imported By: Roderic Ovens 04/03/2010 12:58:46  _____________________________________________________________________  External Attachment:    Type:   Image     Comment:   External Document

## 2010-07-10 DIAGNOSIS — K219 Gastro-esophageal reflux disease without esophagitis: Secondary | ICD-10-CM | POA: Insufficient documentation

## 2010-07-25 LAB — POCT I-STAT, CHEM 8
Chloride: 107 mEq/L (ref 96–112)
Glucose, Bld: 86 mg/dL (ref 70–99)
HCT: 43 % (ref 39.0–52.0)
Hemoglobin: 14.6 g/dL (ref 13.0–17.0)
Potassium: 3.8 mEq/L (ref 3.5–5.1)
Sodium: 140 mEq/L (ref 135–145)

## 2010-07-25 LAB — POCT CARDIAC MARKERS
CKMB, poc: 1.5 ng/mL (ref 1.0–8.0)
Myoglobin, poc: 72.1 ng/mL (ref 12–200)

## 2010-07-27 LAB — COMPREHENSIVE METABOLIC PANEL
ALT: 13 U/L (ref 0–53)
AST: 34 U/L (ref 0–37)
Calcium: 9.1 mg/dL (ref 8.4–10.5)
GFR calc Af Amer: >60 mL/min (ref >60)
Sodium: 140 mEq/L (ref 135–145)
Total Protein: 7.4 g/dL (ref 6.0–8.3)

## 2010-07-27 LAB — POCT CARDIAC MARKERS: CKMB, poc: 21.2 ng/mL (ref 1.0–8.0)

## 2010-07-27 LAB — DIFFERENTIAL
Eosinophils Absolute: 0.1 10*3/uL (ref 0.0–0.7)
Eosinophils Relative: 1 % (ref 0–5)
Lymphs Abs: 1.4 10*3/uL (ref 0.7–4.0)
Monocytes Absolute: 0.5 10*3/uL (ref 0.1–1.0)
Monocytes Relative: 5 % (ref 3–12)

## 2010-07-27 LAB — CBC
HCT: 41 % (ref 39.0–52.0)
HCT: 46.5 % (ref 39.0–52.0)
Hemoglobin: 14.4 g/dL (ref 13.0–17.0)
Hemoglobin: 16.6 g/dL (ref 13.0–17.0)
MCH: 32.1 pg (ref 26.0–34.0)
MCV: 89.3 fL (ref 78.0–100.0)
RBC: 5.17 MIL/uL (ref 4.22–5.81)
RDW: 12.9 % (ref 11.5–15.5)
WBC: 9.4 10*3/uL (ref 4.0–10.5)

## 2010-07-27 LAB — POCT I-STAT, CHEM 8
Calcium, Ion: 1 mmol/L — ABNORMAL LOW (ref 1.12–1.32)
Creatinine, Ser: 1 mg/dL (ref 0.4–1.5)
Glucose, Bld: 100 mg/dL — ABNORMAL HIGH (ref 70–99)
Hemoglobin: 15.6 g/dL (ref 13.0–17.0)
Potassium: 3.8 mEq/L (ref 3.5–5.1)

## 2010-07-27 LAB — APTT: aPTT: 32 seconds (ref 24–37)

## 2010-07-27 LAB — CARDIAC PANEL(CRET KIN+CKTOT+MB+TROPI)
CK, MB: 20.7 ng/mL (ref 0.3–4.0)
CK, MB: 43.1 ng/mL (ref 0.3–4.0)
Relative Index: 10.6 — ABNORMAL HIGH (ref 0.0–2.5)
Total CK: 314 U/L — ABNORMAL HIGH (ref 7–232)

## 2010-07-27 LAB — HEPARIN LEVEL (UNFRACTIONATED): Heparin Unfractionated: 0.15 IU/mL — ABNORMAL LOW (ref 0.30–0.70)

## 2010-07-27 LAB — BASIC METABOLIC PANEL
BUN: 7 mg/dL (ref 6–23)
GFR calc non Af Amer: >60 mL/min (ref >60)
Glucose, Bld: 101 mg/dL — ABNORMAL HIGH (ref 70–99)
Potassium: 3.5 mEq/L (ref 3.5–5.1)

## 2010-07-27 LAB — PROTIME-INR: INR: 0.9 (ref 0.00–1.49)

## 2010-07-27 LAB — LIPID PANEL
Total CHOL/HDL Ratio: 5.1 RATIO
VLDL: 22 mg/dL (ref 0–40)

## 2010-07-27 LAB — CK TOTAL AND CKMB (NOT AT ARMC)
CK, MB: 37.6 ng/mL (ref 0.3–4.0)
Total CK: 428 U/L — ABNORMAL HIGH (ref 7–232)

## 2010-08-15 LAB — COMPREHENSIVE METABOLIC PANEL
BUN: 7 mg/dL (ref 6–23)
Calcium: 9.2 mg/dL (ref 8.4–10.5)
Creatinine, Ser: 1.01 mg/dL (ref 0.4–1.5)
Glucose, Bld: 93 mg/dL (ref 70–99)
Sodium: 140 mEq/L (ref 135–145)
Total Protein: 6.9 g/dL (ref 6.0–8.3)

## 2010-08-15 LAB — URINALYSIS, ROUTINE W REFLEX MICROSCOPIC
Bilirubin Urine: NEGATIVE
Glucose, UA: NEGATIVE mg/dL
Hgb urine dipstick: NEGATIVE
Ketones, ur: NEGATIVE mg/dL
Nitrite: NEGATIVE
Protein, ur: NEGATIVE mg/dL
Specific Gravity, Urine: 1.006 (ref 1.005–1.030)
Urobilinogen, UA: 0.2 mg/dL (ref 0.0–1.0)
pH: 6.5 (ref 5.0–8.0)

## 2010-08-15 LAB — DIFFERENTIAL
Basophils Relative: 0 % (ref 0–1)
Eosinophils Absolute: 0.1 10*3/uL (ref 0.0–0.7)
Monocytes Relative: 6 % (ref 3–12)
Neutro Abs: 5.7 10*3/uL (ref 1.7–7.7)
Neutrophils Relative %: 71 % (ref 43–77)

## 2010-08-15 LAB — CBC
HCT: 44.7 % (ref 39.0–52.0)
Hemoglobin: 15.5 g/dL (ref 13.0–17.0)
MCHC: 34.7 g/dL (ref 30.0–36.0)
MCV: 92.7 fL (ref 78.0–100.0)
RDW: 12.9 % (ref 11.5–15.5)

## 2010-08-15 LAB — LIPASE, BLOOD: Lipase: 20 U/L (ref 11–59)

## 2010-08-15 LAB — POCT CARDIAC MARKERS: Troponin i, poc: <0.05 ng/mL (ref 0.00–0.09)

## 2010-11-20 ENCOUNTER — Encounter: Payer: Self-pay | Admitting: Cardiovascular Disease

## 2011-05-14 DIAGNOSIS — J189 Pneumonia, unspecified organism: Secondary | ICD-10-CM

## 2011-05-14 HISTORY — DX: Pneumonia, unspecified organism: J18.9

## 2011-06-20 DIAGNOSIS — R972 Elevated prostate specific antigen [PSA]: Secondary | ICD-10-CM | POA: Insufficient documentation

## 2011-06-23 ENCOUNTER — Other Ambulatory Visit: Payer: Self-pay

## 2011-06-23 ENCOUNTER — Emergency Department (HOSPITAL_COMMUNITY)
Admission: EM | Admit: 2011-06-23 | Discharge: 2011-06-23 | Disposition: A | Discharge status: Home / Self Care | Payer: Medicare Other | Attending: Emergency Medicine | Admitting: Emergency Medicine

## 2011-06-23 ENCOUNTER — Emergency Department (HOSPITAL_COMMUNITY): Payer: Medicare Other

## 2011-06-23 ENCOUNTER — Encounter (HOSPITAL_COMMUNITY): Payer: Self-pay | Admitting: *Deleted

## 2011-06-23 DIAGNOSIS — R112 Nausea with vomiting, unspecified: Secondary | ICD-10-CM | POA: Insufficient documentation

## 2011-06-23 DIAGNOSIS — R05 Cough: Secondary | ICD-10-CM | POA: Insufficient documentation

## 2011-06-23 DIAGNOSIS — E785 Hyperlipidemia, unspecified: Secondary | ICD-10-CM | POA: Insufficient documentation

## 2011-06-23 DIAGNOSIS — R5383 Other fatigue: Secondary | ICD-10-CM | POA: Insufficient documentation

## 2011-06-23 DIAGNOSIS — R059 Cough, unspecified: Secondary | ICD-10-CM | POA: Insufficient documentation

## 2011-06-23 DIAGNOSIS — R5381 Other malaise: Secondary | ICD-10-CM | POA: Insufficient documentation

## 2011-06-23 DIAGNOSIS — I252 Old myocardial infarction: Secondary | ICD-10-CM | POA: Insufficient documentation

## 2011-06-23 DIAGNOSIS — Z79899 Other long term (current) drug therapy: Secondary | ICD-10-CM | POA: Insufficient documentation

## 2011-06-23 DIAGNOSIS — J189 Pneumonia, unspecified organism: Secondary | ICD-10-CM | POA: Insufficient documentation

## 2011-06-23 DIAGNOSIS — R0602 Shortness of breath: Secondary | ICD-10-CM | POA: Insufficient documentation

## 2011-06-23 DIAGNOSIS — R079 Chest pain, unspecified: Secondary | ICD-10-CM | POA: Insufficient documentation

## 2011-06-23 DIAGNOSIS — Z8639 Personal history of other endocrine, nutritional and metabolic disease: Secondary | ICD-10-CM | POA: Insufficient documentation

## 2011-06-23 DIAGNOSIS — Z862 Personal history of diseases of the blood and blood-forming organs and certain disorders involving the immune mechanism: Secondary | ICD-10-CM | POA: Insufficient documentation

## 2011-06-23 DIAGNOSIS — R002 Palpitations: Secondary | ICD-10-CM | POA: Insufficient documentation

## 2011-06-23 DIAGNOSIS — I2510 Atherosclerotic heart disease of native coronary artery without angina pectoris: Secondary | ICD-10-CM | POA: Insufficient documentation

## 2011-06-23 DIAGNOSIS — J3489 Other specified disorders of nose and nasal sinuses: Secondary | ICD-10-CM | POA: Insufficient documentation

## 2011-06-23 LAB — BASIC METABOLIC PANEL WITH GFR
BUN: 14 mg/dL (ref 6–23)
CO2: 27 meq/L (ref 19–32)
Calcium: 9.4 mg/dL (ref 8.4–10.5)
Chloride: 100 meq/L (ref 96–112)
Creatinine, Ser: 1.11 mg/dL (ref 0.50–1.35)
GFR calc Af Amer: 75 mL/min — ABNORMAL LOW
GFR calc non Af Amer: 65 mL/min — ABNORMAL LOW
Glucose, Bld: 110 mg/dL — ABNORMAL HIGH (ref 70–99)
Potassium: 4.3 meq/L (ref 3.5–5.1)
Sodium: 135 meq/L (ref 135–145)

## 2011-06-23 LAB — CBC
HCT: 42.7 % (ref 39.0–52.0)
Hemoglobin: 15.2 g/dL (ref 13.0–17.0)
MCH: 31 pg (ref 26.0–34.0)
MCHC: 35.6 g/dL (ref 30.0–36.0)
MCV: 87.1 fL (ref 78.0–100.0)
RDW: 13.3 % (ref 11.5–15.5)

## 2011-06-23 LAB — DIFFERENTIAL
Basophils Absolute: 0 10*3/uL (ref 0.0–0.1)
Basophils Relative: 0 % (ref 0–1)
Eosinophils Relative: 0 % (ref 0–5)
Monocytes Absolute: 1.4 10*3/uL — ABNORMAL HIGH (ref 0.1–1.0)
Monocytes Relative: 9 % (ref 3–12)
Neutro Abs: 13.3 10*3/uL — ABNORMAL HIGH (ref 1.7–7.7)

## 2011-06-23 LAB — PROTIME-INR
INR: 1.03 (ref 0.00–1.49)
Prothrombin Time: 13.7 s (ref 11.6–15.2)

## 2011-06-23 LAB — POCT I-STAT TROPONIN I: Troponin i, poc: 0 ng/mL (ref 0.00–0.08)

## 2011-06-23 MED ORDER — DOXYCYCLINE HYCLATE 100 MG PO CAPS: 100.0000 mg | ORAL_CAPSULE | Freq: Two times a day (BID) | ORAL | Status: AC

## 2011-06-23 MED ORDER — BUPIVACAINE HCL (PF) 0.5 % IJ SOLN
INTRAMUSCULAR | Status: AC
  Filled 2011-06-23: qty 10

## 2011-06-23 MED ORDER — OSELTAMIVIR PHOSPHATE 75 MG PO CAPS
75.0000 mg | ORAL_CAPSULE | ORAL | Status: AC
  Administered 2011-06-23: 75 mg via ORAL
  Filled 2011-06-23: qty 1

## 2011-06-23 MED ORDER — ALBUTEROL SULFATE HFA 108 (90 BASE) MCG/ACT IN AERS
2.0000 | INHALATION_SPRAY | RESPIRATORY_TRACT | Status: DC | PRN
  Administered 2011-06-23: 2 via RESPIRATORY_TRACT
  Filled 2011-06-23: qty 6.7

## 2011-06-23 MED ORDER — ASPIRIN 81 MG PO CHEW
324.0000 mg | CHEWABLE_TABLET | Freq: Once | ORAL | Status: AC
  Administered 2011-06-23: 324 mg via ORAL
  Filled 2011-06-23: qty 4

## 2011-06-23 MED ORDER — ONDANSETRON HCL 4 MG/2ML IJ SOLN: 4.0000 mg | Freq: Once | INTRAMUSCULAR | Status: DC

## 2011-06-23 MED ORDER — DEXTROSE 5 % IV SOLN
1.0000 g | Freq: Once | INTRAVENOUS | Status: AC
  Administered 2011-06-23: 1 g via INTRAVENOUS
  Filled 2011-06-23: qty 10

## 2011-06-23 MED ORDER — DEXTROSE 5 % IV SOLN
500.0000 mg | Freq: Once | INTRAVENOUS | Status: AC
  Administered 2011-06-23: 500 mg via INTRAVENOUS
  Filled 2011-06-23: qty 500

## 2011-06-23 MED ORDER — ALBUTEROL SULFATE (5 MG/ML) 0.5% IN NEBU
5.0000 mg | INHALATION_SOLUTION | Freq: Once | RESPIRATORY_TRACT | Status: AC
  Administered 2011-06-23: 5 mg via RESPIRATORY_TRACT
  Filled 2011-06-23: qty 1

## 2011-06-23 NOTE — ED Provider Notes (Signed)
History     CSN: [id number]  Arrival date & time 06/23/11  7829   First MD Initiated Contact with Patient 06/23/11 1045      Chief Complaint  Patient presents with  . Chest Pain    (Consider location/radiation/quality/duration/timing/severity/associated sxs/prior treatment) Patient is a 71 y.o. male presenting with URI. The history is provided by the patient. No language interpreter was used.  URI The primary symptoms include fatigue and cough. Primary symptoms do not include fever, headaches, ear pain, sore throat, swollen glands, abdominal pain, nausea or vomiting. The current episode started 2 days ago. This is a new problem. The problem has been gradually worsening.  The fatigue began 2 days ago. The fatigue has been worsening since its onset.  The cough began 2 days ago. The cough is new. The cough is productive. The sputum is green.  Symptoms associated with the illness include congestion and rhinorrhea. The illness is not associated with chills.    Past Medical History  Diagnosis Date  . CAD (coronary artery disease)     BMS RCA 8/11; LVEF 50-55%  . HLD (hyperlipidemia)   . HTN (hypertension)   . MI (myocardial infarction)     MSTEMI 8/11  . Gout     History reviewed. No pertinent past surgical history.  History reviewed. No pertinent family history.  History  Substance Use Topics  . Smoking status: Former Games developer  . Smokeless tobacco: Not on file   Comment: quit 9/11  . Alcohol Use: No      Review of Systems  Constitutional: Positive for fatigue. Negative for fever, chills, activity change and appetite change.  HENT: Positive for congestion and rhinorrhea. Negative for ear pain, sore throat, neck pain and neck stiffness.   Respiratory: Positive for cough and shortness of breath.   Cardiovascular: Positive for palpitations. Negative for chest pain.  Gastrointestinal: Negative for nausea, vomiting and abdominal pain.  Genitourinary: Negative for dysuria,  urgency, frequency and flank pain.  Neurological: Negative for dizziness, weakness, light-headedness, numbness and headaches.  All other systems reviewed and are negative.    Allergies  Penicillins  Home Medications   Current Outpatient Rx  Name Route Sig Dispense Refill  . AMLODIPINE BESYLATE 2.5 MG PO TABS Oral Take 2.5 mg by mouth daily.      . ASPIRIN 81 MG PO TBEC Oral Take 81 mg by mouth daily.      Marland Kitchen NITROGLYCERIN 0.4 MG SL SUBL Sublingual Place 0.4 mg under the tongue every 5 (five) minutes as needed. For chest pain    . DOXYCYCLINE HYCLATE 100 MG PO CAPS Oral Take 1 capsule (100 mg total) by mouth 2 (two) times daily. 20 capsule 0    BP 110/53  Pulse 82  Temp(Src) 98.7 F (37.1 C) (Oral)  Resp 20  SpO2 93%  Physical Exam  Nursing note and vitals reviewed. Constitutional: He is oriented to person, place, and time. He appears well-developed and well-nourished. No distress.  HENT:  Head: Normocephalic and atraumatic.  Mouth/Throat: Oropharynx is clear and moist. No oropharyngeal exudate.  Eyes: Conjunctivae and EOM are normal. Pupils are equal, round, and reactive to light.  Neck: Normal range of motion. Neck supple.  Cardiovascular: Normal rate, regular rhythm, normal heart sounds and intact distal pulses.  Exam reveals no gallop and no friction rub.   No murmur heard. Pulmonary/Chest: Effort normal and breath sounds normal.  Abdominal: Soft. Bowel sounds are normal. There is no tenderness.  Musculoskeletal: Normal range of motion.  He exhibits no edema and no tenderness.  Neurological: He is alert and oriented to person, place, and time. No cranial nerve deficit.  Skin: Skin is warm and dry.    ED Course  Procedures (including critical care time)   Date: 06/23/2011  Rate: 83  Rhythm: normal sinus rhythm  QRS Axis: normal  Intervals: normal  ST/T Wave abnormalities: normal  Conduction Disutrbances:none  Narrative Interpretation:   Old EKG Reviewed:  unchanged  Labs Reviewed  CBC - Abnormal; Notable for the following:    WBC 16.1 (*)    All other components within normal limits  DIFFERENTIAL - Abnormal; Notable for the following:    Neutrophils Relative 82 (*)    Neutro Abs 13.3 (*)    Lymphocytes Relative 9 (*)    Monocytes Absolute 1.4 (*)    All other components within normal limits  BASIC METABOLIC PANEL - Abnormal; Notable for the following:    Glucose, Bld 110 (*)    GFR calc non Af Amer 65 (*)    GFR calc Af Amer 75 (*)    All other components within normal limits  PRO B NATRIURETIC PEPTIDE - Abnormal; Notable for the following:    Pro B Natriuretic peptide (BNP) 808.5 (*)    All other components within normal limits  PROTIME-INR  POCT I-STAT TROPONIN I   Dg Chest 2 View  06/23/2011  *RADIOLOGY REPORT*  Clinical Data: Chest pain.  Nausea and vomiting.  CHEST - 2 VIEW  Comparison: 01/08/2010  Findings: Right middle lobe infiltrate is present, suspicious for pneumonia.  Mild bilateral  lower lobe airspace disease also present.  Negative for heart failure.  Negative for pleural effusion.  IMPRESSION: Right middle lobe infiltrate, probable pneumonia.  Mild bibasilar infiltrate also present.  Original Report Authenticated By: Camelia Phenes, M.D.     1. CAP (community acquired pneumonia)       MDM  Community acquired pneumonia. White count 16. The patient looks and feels well. Some subjective improvement after a breathing treatment. Rales in the right lower lobe. Able to ambulate without difficulty. Maintained his oxygen saturations. Received Rocephin and Zithromax in the emergency department. At this time the patient wishes to be discharged home in an attempt home therapy. He was provided clear signs and symptoms for which to return. He'll be discharged home on doxycycline.        Dayton Bailiff, MD 06/23/11 [phone]

## 2011-06-23 NOTE — ED Notes (Signed)
Pt resting quietly at the time. Remains on cardiac monitor. Denies pain. Respirations unlabored. Dr. Brooke Dare at bedside, updated pt on plan of care.

## 2011-06-23 NOTE — ED Notes (Signed)
Reports having episode last night of palpitations and n/v, today his only complaint is congestion in his throat, denies any cp or sob. ekg done at triage.

## 2011-06-23 NOTE — ED Notes (Signed)
Pt states he does not want nausea medication at the time.

## 2011-06-23 NOTE — ED Notes (Signed)
Pt presents to department for evaluation of palpitations and N/V. Onset last night. Pt states he was walking dog outside when he felt his heart "jumping out" of his chest. Denies chest pain at the time. Lung sounds clear and equal bilaterally. Pt conscious alert and oriented x4. Respirations unlabored.

## 2011-06-23 NOTE — ED Notes (Signed)
Pt ambulated on portable pulse ox, 93% on room air while walking. Tolerated without difficulty.

## 2011-06-23 NOTE — ED Notes (Signed)
Please call Terris Germano (son) at [phone] (c) with any updates

## 2011-06-23 NOTE — ED Notes (Signed)
Pt discharged home. Had no further questions. 

## 2011-06-26 ENCOUNTER — Ambulatory Visit
Admission: RE | Admit: 2011-06-26 | Discharge: 2011-06-26 | Disposition: A | Discharge status: Home / Self Care | Payer: Medicare Other | Source: Ambulatory Visit | Attending: Family Medicine | Admitting: Family Medicine

## 2011-06-26 ENCOUNTER — Other Ambulatory Visit: Payer: Self-pay | Admitting: Family Medicine

## 2011-06-26 DIAGNOSIS — J189 Pneumonia, unspecified organism: Secondary | ICD-10-CM

## 2011-07-04 DIAGNOSIS — K439 Ventral hernia without obstruction or gangrene: Secondary | ICD-10-CM | POA: Insufficient documentation

## 2011-10-11 ENCOUNTER — Ambulatory Visit: Payer: Medicare Other | Admitting: Urology

## 2012-04-26 ENCOUNTER — Emergency Department (HOSPITAL_COMMUNITY)
Admission: EM | Admit: 2012-04-26 | Discharge: 2012-04-26 | Disposition: A | Discharge status: Home / Self Care | Payer: Medicare Other | Attending: Emergency Medicine | Admitting: Emergency Medicine

## 2012-04-26 ENCOUNTER — Encounter (HOSPITAL_COMMUNITY): Payer: Self-pay | Admitting: Emergency Medicine

## 2012-04-26 DIAGNOSIS — I2510 Atherosclerotic heart disease of native coronary artery without angina pectoris: Secondary | ICD-10-CM | POA: Insufficient documentation

## 2012-04-26 DIAGNOSIS — Z8739 Personal history of other diseases of the musculoskeletal system and connective tissue: Secondary | ICD-10-CM | POA: Insufficient documentation

## 2012-04-26 DIAGNOSIS — I10 Essential (primary) hypertension: Secondary | ICD-10-CM | POA: Insufficient documentation

## 2012-04-26 DIAGNOSIS — Z87891 Personal history of nicotine dependence: Secondary | ICD-10-CM | POA: Insufficient documentation

## 2012-04-26 DIAGNOSIS — I252 Old myocardial infarction: Secondary | ICD-10-CM | POA: Insufficient documentation

## 2012-04-26 DIAGNOSIS — Z7982 Long term (current) use of aspirin: Secondary | ICD-10-CM | POA: Insufficient documentation

## 2012-04-26 DIAGNOSIS — Z9889 Other specified postprocedural states: Secondary | ICD-10-CM | POA: Insufficient documentation

## 2012-04-26 DIAGNOSIS — E785 Hyperlipidemia, unspecified: Secondary | ICD-10-CM | POA: Insufficient documentation

## 2012-04-26 DIAGNOSIS — Z79899 Other long term (current) drug therapy: Secondary | ICD-10-CM | POA: Insufficient documentation

## 2012-04-26 LAB — URINALYSIS, ROUTINE W REFLEX MICROSCOPIC
Leukocytes, UA: NEGATIVE
Nitrite: NEGATIVE
Specific Gravity, Urine: 1.003 — ABNORMAL LOW (ref 1.005–1.030)
pH: 6.5 (ref 5.0–8.0)

## 2012-04-26 LAB — POCT I-STAT, CHEM 8
Chloride: 103 mEq/L (ref 96–112)
HCT: 42 % (ref 39.0–52.0)
Potassium: 3.7 mEq/L (ref 3.5–5.1)
Sodium: 141 mEq/L (ref 135–145)

## 2012-04-26 NOTE — ED Notes (Signed)
Dr. Linker at bedside  

## 2012-04-26 NOTE — ED Notes (Signed)
Pt has multiple open wounds to lower legs bilaterally. Redness, itchy rash, dryness to lower legs bilaterally. Pt has admitted to scratching legs. Right lower leg has warm bulge, pt states this has been there for years. Moderate dp pulses bilaterally. Pt states his blood pressure medication is causing the rash to legs. He stopped his amlodipine two weeks ago. Rash started two weeks ago.

## 2012-04-26 NOTE — ED Notes (Signed)
Pt states he has high blood pressure was prescribed lisinopril and amlodipine, pressure remained high in 150's. Pruritic rash developed on legs bilaterally. A&Ox4, ambulatory, nad.

## 2012-04-26 NOTE — ED Provider Notes (Addendum)
History     CSN: [id number]  Arrival date & time 04/26/12  1042   First MD Initiated Contact with Patient 04/26/12 1046      Chief Complaint  Patient presents with  . Hypertension    (Consider location/radiation/quality/duration/timing/severity/associated sxs/prior treatment) HPI Pt presents with c/o concern about his blood pressure being elevated.  Pt states he had developed an itchy rash approx 2 weeks ago - he then saw his primary care doctor who stopped his amlodipine and started him on lisinopril- he states he was told to take 30mg  but he was concerned so only took 15mg  daily.  He then noted that his blood pressure was elevated which prompted ED visit.  He denies any chest pain, no sob, no focal weakness, no headache.  There are no other associated systemic symptoms, there are no other alleviating or modifying factors.   Past Medical History  Diagnosis Date  . CAD (coronary artery disease)     BMS RCA 8/11; LVEF 50-55%  . HLD (hyperlipidemia)   . HTN (hypertension)   . MI (myocardial infarction)     MSTEMI 8/11  . Gout     Past Surgical History  Procedure Date  . Carotid stent     No family history on file.  History  Substance Use Topics  . Smoking status: Former Games developer  . Smokeless tobacco: Not on file     Comment: quit 9/11  . Alcohol Use: No      Review of Systems ROS reviewed and all otherwise negative except for mentioned in HPI  Allergies  Penicillins  Home Medications   Current Outpatient Rx  Name  Route  Sig  Dispense  Refill  . AMLODIPINE BESYLATE 2.5 MG PO TABS   Oral   Take 2.5 mg by mouth daily.           . ASPIRIN 81 MG PO TBEC   Oral   Take 81 mg by mouth daily.           . CEPHALEXIN 500 MG PO CAPS   Oral   Take 500 mg by mouth 2 (two) times daily.         Marland Kitchen LISINOPRIL 30 MG PO TABS   Oral   Take 15 mg by mouth daily.          Marland Kitchen NITROGLYCERIN 0.4 MG SL SUBL   Sublingual   Place 0.4 mg under the tongue every 5  (five) minutes as needed. For chest pain           BP 159/56  Pulse 56  Temp 97.7 F (36.5 C) (Oral)  Resp 18  SpO2 100% Vitals reviewed Physical Exam Physical Examination: General appearance - alert, well appearing, and in no distress Mental status - alert, oriented to person, place, and time Eyes - no conjunctival injections, no scleral icterus Mouth - mucous membranes moist, pharynx normal without lesions Chest - clear to auscultation, no wheezes, rales or rhonchi, symmetric air entry Heart - normal rate, regular rhythm, normal S1, S2, no murmurs, rubs, clicks or gallops Abdomen - soft, nontender, nondistended, no masses or organomegaly Extremities - peripheral pulses normal, no pedal edema, no clubbing or cyanosis Skin - normal coloration and turgor, splotchy erythematous papules overlying bilateral lower extremities with overlying excoriations. No vesicles or prurulence  ED Course  Procedures (including critical care time)   Date: 04/26/2012  Rate: 59  Rhythm: sinus bradycardia  QRS Axis: normal  Intervals: normal  ST/T Wave abnormalities: normal  Conduction Disutrbances:none  Narrative Interpretation:   Old EKG Reviewed: none available    Labs Reviewed  URINALYSIS, ROUTINE W REFLEX MICROSCOPIC - Abnormal; Notable for the following:    APPearance CLOUDY (*)     Specific Gravity, Urine 1.003 (*)     All other components within normal limits  POCT I-STAT, CHEM 8   No results found.   1. Hypertension       MDM  Pt presenting with c/o concern that blood pressure is elevated- he has no symptoms correlating with this.  He has been taking 15mg  lisinopril which is half the dose he was prescribed.  He is also on keflex for the rash/mild cellulitis on his bilateral lower extremities.  Pt is overall nontoxic and well appearing on exam.  No signs of end organ damage.  Discussed the importance of patient f/u with his doctor to discuss his concerns about the medication  but that he prob needs to take full prescribed dose to see the desired effect on his blood pressure.  Discharged with strict return precautions.  Pt agreeable with plan.        Ethelda Chick, MD 04/26/12 1427  Ethelda Chick, MD 04/26/12 [phone]

## 2012-06-03 ENCOUNTER — Encounter (HOSPITAL_COMMUNITY): Payer: Self-pay | Admitting: *Deleted

## 2012-06-03 ENCOUNTER — Emergency Department (INDEPENDENT_AMBULATORY_CARE_PROVIDER_SITE_OTHER)
Admission: EM | Admit: 2012-06-03 | Discharge: 2012-06-03 | Disposition: A | Discharge status: Home / Self Care | Payer: Medicare Other | Source: Home / Self Care | Attending: Family Medicine | Admitting: Family Medicine

## 2012-06-03 DIAGNOSIS — L858 Other specified epidermal thickening: Secondary | ICD-10-CM

## 2012-06-03 DIAGNOSIS — D485 Neoplasm of uncertain behavior of skin: Secondary | ICD-10-CM

## 2012-06-03 MED ORDER — CEPHALEXIN 500 MG PO CAPS: 500.0000 mg | ORAL_CAPSULE | Freq: Two times a day (BID) | ORAL | Status: DC

## 2012-06-03 NOTE — ED Provider Notes (Signed)
History     CSN: [id number]  Arrival date & time 06/03/12  1255   First MD Initiated Contact with Patient 06/03/12 1305      Chief Complaint  Patient presents with  . Abscess    (Consider location/radiation/quality/duration/timing/severity/associated sxs/prior treatment) HPI Comments: 72 year old male with history of coronary artery disease and hypertension. Here complaining of a hard tender area in his left earlobe, this has been present for several weeks probably months but in the last week patient "tried to pop it with a burned safety pin"!. Denies purulent return and states he had some blood coming out. Has experienced increased redness and tenderness since. No other similar lesions in the face or other body areas. Otherwise reports feeling well today.     Past Medical History  Diagnosis Date  . CAD (coronary artery disease)     BMS RCA 8/11; LVEF 50-55%  . HLD (hyperlipidemia)   . HTN (hypertension)   . MI (myocardial infarction)     MSTEMI 8/11  . Gout     Past Surgical History  Procedure Date  . Carotid stent     Family History  Problem Relation Age of Onset  . Family history unknown: Yes    History  Substance Use Topics  . Smoking status: Former Games developer  . Smokeless tobacco: Not on file     Comment: quit 9/11  . Alcohol Use: No      Review of Systems  Constitutional: Negative for fever and chills.  Respiratory: Negative for shortness of breath.   Cardiovascular: Negative for chest pain and leg swelling.  Gastrointestinal: Negative for nausea, vomiting and abdominal pain.  Neurological: Negative for dizziness and headaches.    Allergies  Penicillins  Home Medications   Current Outpatient Rx  Name  Route  Sig  Dispense  Refill  . ASPIRIN 81 MG PO TBEC   Oral   Take 81 mg by mouth daily.           Marland Kitchen LISINOPRIL 30 MG PO TABS   Oral   Take 15 mg by mouth daily.          Marland Kitchen AMLODIPINE BESYLATE 2.5 MG PO TABS   Oral   Take 2.5 mg by mouth  daily.           . CEPHALEXIN 500 MG PO CAPS   Oral   Take 500 mg by mouth 2 (two) times daily.         Marland Kitchen NITROGLYCERIN 0.4 MG SL SUBL   Sublingual   Place 0.4 mg under the tongue every 5 (five) minutes as needed. For chest pain           BP 189/81  Pulse 66  Temp 97.7 F (36.5 C) (Oral)  Resp 18  Ht 5\' 10"  (1.778 m)  Wt 170 lb (77.111 kg)  BMI 24.39 kg/m2  SpO2 97%  Physical Exam  Nursing note and vitals reviewed. Constitutional: He appears well-developed and well-nourished. No distress.  HENT:  Head: Normocephalic and atraumatic.  Mouth/Throat: Oropharynx is clear and moist. No oropharyngeal exudate.  Eyes: Conjunctivae normal are normal. No scleral icterus.  Cardiovascular: Normal heart sounds.   Skin: He is not diaphoretic.       Left ear lobe: round lesion resembling a granuloma surrounding a round hard consistency scab in the center. appears to affect the width of the skin. There is mild erythema and tenderness to palpation around. No fluctuations or spontaneous drainage. There are few other seborrheic keratosis  like lesions in face.     ED Course  Procedures (including critical care time)  Labs Reviewed - No data to display No results found.   1. Keratoacanthoma       MDM  Impress keratoacanthoma of the left earlobe.  Possible early signs of infection after traumatic irritation.  Prescribed Keflex. Try to refer to dermatologist but no dermatologists in our area accept Medicare.  Patient was referred to ENT (Dr. Jearld Fenton) and an appointment was made for next Tuesday at 1:45 PM. At [facility]. Otherwise followup with his primary care provider or cardiologist to monitor his blood pressure and other chronic comorbidities.        Sharin Grave, MD 06/05/12 [phone]

## 2012-06-03 NOTE — ED Notes (Signed)
Pt has abscess on left ear for the past week - tried to pop with needle hisself with little relief - lobe is swollen , red and tender to touch

## 2012-06-27 ENCOUNTER — Other Ambulatory Visit: Payer: Self-pay

## 2012-07-16 ENCOUNTER — Encounter (HOSPITAL_COMMUNITY): Payer: Self-pay

## 2012-07-21 ENCOUNTER — Encounter (HOSPITAL_COMMUNITY)
Admission: RE | Admit: 2012-07-21 | Discharge: 2012-07-21 | Disposition: A | Discharge status: Home / Self Care | Payer: Medicare Other | Source: Ambulatory Visit | Attending: Anesthesiology | Admitting: Anesthesiology

## 2012-07-21 ENCOUNTER — Encounter (HOSPITAL_COMMUNITY)
Admission: RE | Admit: 2012-07-21 | Discharge: 2012-07-21 | Disposition: A | Discharge status: Home / Self Care | Payer: Medicare Other | Source: Ambulatory Visit | Attending: Otolaryngology | Admitting: Otolaryngology

## 2012-07-21 ENCOUNTER — Encounter (HOSPITAL_COMMUNITY): Payer: Self-pay

## 2012-07-21 HISTORY — DX: Pneumonia, unspecified organism: J18.9

## 2012-07-21 HISTORY — DX: Rash and other nonspecific skin eruption: R21

## 2012-07-21 HISTORY — DX: Varicose veins of unspecified lower extremity with other complications: I83.899

## 2012-07-21 HISTORY — DX: Malignant (primary) neoplasm, unspecified: C80.1

## 2012-07-21 HISTORY — DX: Gastro-esophageal reflux disease without esophagitis: K21.9

## 2012-07-21 HISTORY — DX: Squamous cell carcinoma of skin of unspecified ear and external auricular canal: C44.221

## 2012-07-21 LAB — BASIC METABOLIC PANEL
CO2: 26 mEq/L (ref 19–32)
Chloride: 103 mEq/L (ref 96–112)
Sodium: 139 mEq/L (ref 135–145)

## 2012-07-21 LAB — CBC
HCT: 40.7 % (ref 39.0–52.0)
MCV: 86 fL (ref 78.0–100.0)
Platelets: 182 10*3/uL (ref 150–400)
RBC: 4.73 MIL/uL (ref 4.22–5.81)
WBC: 7.3 10*3/uL (ref 4.0–10.5)

## 2012-07-21 LAB — SURGICAL PCR SCREEN: Staphylococcus aureus: POSITIVE — AB

## 2012-07-21 NOTE — Pre-Procedure Instructions (Signed)
Angel [name]  07/21/2012   Your procedure is scheduled on:  Thursday July 23, 2012  Report to [facility] at 5:30 AM.  Call this number if you have problems the morning of surgery: [phone]   Remember:   Do not eat food or drink liquids after midnight.   Take these medicines the morning of surgery with A SIP OF WATER: nitroglycerin (if needed)   Do not wear jewelry, make-up or nail polish.  Do not wear lotions, powders, or perfumes.  Do not shave 48 hours prior to surgery. Men may shave face and neck.  Do not bring valuables to the hospital.  Contacts, dentures or bridgework may not be worn into surgery.  Leave suitcase in the car. After surgery it may be brought to your room.  For patients admitted to the hospital, checkout time is 11:00 AM the day of  discharge.   Patients discharged the day of surgery will not be allowed to drive home.  Name and phone number of your driver: family / friend  Special Instructions: Shower using CHG 2 nights before surgery and the night before surgery.  If you shower the day of surgery use CHG.  Use special wash - you have one bottle of CHG for all showers.  You should use approximately 1/3 of the bottle for each shower.   Please read over the following fact sheets that you were given: Pain Booklet, Coughing and Deep Breathing, MRSA Information and Surgical Site Infection Prevention

## 2012-07-22 ENCOUNTER — Other Ambulatory Visit: Payer: Self-pay | Admitting: Otolaryngology

## 2012-07-22 NOTE — Progress Notes (Signed)
Nurse called and left voicemail with Dr. Jearld Fenton nurse and requested that orders be entered in [facility] as surgery is scheduled for tomorrow morning.

## 2012-07-23 ENCOUNTER — Encounter (HOSPITAL_COMMUNITY): Payer: Self-pay | Admitting: Anesthesiology

## 2012-07-23 ENCOUNTER — Ambulatory Visit (HOSPITAL_COMMUNITY): Payer: Medicare Other | Admitting: Anesthesiology

## 2012-07-23 ENCOUNTER — Ambulatory Visit (HOSPITAL_COMMUNITY)
Admission: RE | Admit: 2012-07-23 | Discharge: 2012-07-23 | Disposition: A | Discharge status: Home / Self Care | Payer: Medicare Other | Source: Ambulatory Visit | Attending: Otolaryngology | Admitting: Otolaryngology

## 2012-07-23 ENCOUNTER — Encounter (HOSPITAL_COMMUNITY)
Admission: RE | Disposition: A | Discharge status: Home / Self Care | Payer: Self-pay | Source: Ambulatory Visit | Attending: Otolaryngology

## 2012-07-23 DIAGNOSIS — I10 Essential (primary) hypertension: Secondary | ICD-10-CM | POA: Insufficient documentation

## 2012-07-23 DIAGNOSIS — E785 Hyperlipidemia, unspecified: Secondary | ICD-10-CM | POA: Insufficient documentation

## 2012-07-23 DIAGNOSIS — I252 Old myocardial infarction: Secondary | ICD-10-CM | POA: Insufficient documentation

## 2012-07-23 DIAGNOSIS — C44221 Squamous cell carcinoma of skin of unspecified ear and external auricular canal: Secondary | ICD-10-CM | POA: Insufficient documentation

## 2012-07-23 DIAGNOSIS — I2510 Atherosclerotic heart disease of native coronary artery without angina pectoris: Secondary | ICD-10-CM | POA: Insufficient documentation

## 2012-07-23 HISTORY — PX: EAR CYST EXCISION: SHX22

## 2012-07-23 SURGERY — EXCISION, CYST, EAR
Anesthesia: General | Site: Ear | Laterality: Left | Wound class: Dirty or Infected

## 2012-07-23 MED ORDER — ONDANSETRON HCL 4 MG/2ML IJ SOLN: 4.0000 mg | Freq: Once | INTRAMUSCULAR | Status: DC | PRN

## 2012-07-23 MED ORDER — MUPIROCIN 2 % EX OINT
TOPICAL_OINTMENT | Freq: Two times a day (BID) | CUTANEOUS | Status: DC
  Administered 2012-07-23: 07:00:00 via NASAL
  Filled 2012-07-23: qty 22

## 2012-07-23 MED ORDER — PROPOFOL 10 MG/ML IV BOLUS
INTRAVENOUS | Status: DC | PRN
  Administered 2012-07-23: 180 mg via INTRAVENOUS

## 2012-07-23 MED ORDER — LIDOCAINE-EPINEPHRINE 1 %-1:100000 IJ SOLN
INTRAMUSCULAR | Status: DC | PRN
  Administered 2012-07-23: 20 mL

## 2012-07-23 MED ORDER — HYDROCODONE-ACETAMINOPHEN 5-500 MG PO TABS: 1.0000 | ORAL_TABLET | Freq: Four times a day (QID) | ORAL | Status: DC | PRN

## 2012-07-23 MED ORDER — FENTANYL CITRATE 0.05 MG/ML IJ SOLN
INTRAMUSCULAR | Status: DC | PRN
  Administered 2012-07-23: 100 ug via INTRAVENOUS

## 2012-07-23 MED ORDER — LIDOCAINE-EPINEPHRINE 1 %-1:100000 IJ SOLN
INTRAMUSCULAR | Status: AC
  Filled 2012-07-23: qty 1

## 2012-07-23 MED ORDER — ONDANSETRON HCL 4 MG/2ML IJ SOLN
INTRAMUSCULAR | Status: DC | PRN
  Administered 2012-07-23: 4 mg via INTRAVENOUS

## 2012-07-23 MED ORDER — SUCCINYLCHOLINE CHLORIDE 20 MG/ML IJ SOLN
INTRAMUSCULAR | Status: DC | PRN
  Administered 2012-07-23: 100 mg via INTRAVENOUS

## 2012-07-23 MED ORDER — CLINDAMYCIN PHOSPHATE 600 MG/50ML IV SOLN
INTRAVENOUS | Status: AC
  Administered 2012-07-23: 600 mg via INTRAVENOUS
  Filled 2012-07-23: qty 50

## 2012-07-23 MED ORDER — LACTATED RINGERS IV SOLN
INTRAVENOUS | Status: DC | PRN
  Administered 2012-07-23 (×2): via INTRAVENOUS

## 2012-07-23 MED ORDER — PHENYLEPHRINE HCL 10 MG/ML IJ SOLN
10.0000 mg | INTRAVENOUS | Status: DC | PRN
  Administered 2012-07-23: 40 ug/min via INTRAVENOUS

## 2012-07-23 MED ORDER — 0.9 % SODIUM CHLORIDE (POUR BTL) OPTIME
TOPICAL | Status: DC | PRN
  Administered 2012-07-23: 1000 mL

## 2012-07-23 MED ORDER — BACITRACIN ZINC 500 UNIT/GM EX OINT
TOPICAL_OINTMENT | CUTANEOUS | Status: DC | PRN
  Administered 2012-07-23: 1 via TOPICAL

## 2012-07-23 MED ORDER — BACITRACIN ZINC 500 UNIT/GM EX OINT
TOPICAL_OINTMENT | CUTANEOUS | Status: AC
  Filled 2012-07-23: qty 15

## 2012-07-23 MED ORDER — LIDOCAINE HCL (CARDIAC) 20 MG/ML IV SOLN
INTRAVENOUS | Status: DC | PRN
  Administered 2012-07-23: 100 mg via INTRAVENOUS

## 2012-07-23 MED ORDER — CLINDAMYCIN HCL 300 MG PO CAPS: 300.0000 mg | ORAL_CAPSULE | Freq: Three times a day (TID) | ORAL | Status: DC

## 2012-07-23 MED ORDER — MUPIROCIN 2 % EX OINT
TOPICAL_OINTMENT | CUTANEOUS | Status: AC
  Filled 2012-07-23: qty 22

## 2012-07-23 MED ORDER — HYDROMORPHONE HCL PF 1 MG/ML IJ SOLN: 0.2500 mg | INTRAMUSCULAR | Status: DC | PRN

## 2012-07-23 SURGICAL SUPPLY — 60 items
ADH SKN CLS APL DERMABOND .7 (GAUZE/BANDAGES/DRESSINGS)
AIRSTRIP 4 3/4X3 1/4 7185 (GAUZE/BANDAGES/DRESSINGS) IMPLANT
ATTRACTOMAT 16X20 MAGNETIC DRP (DRAPES) IMPLANT
BANDAGE CONFORM 2  STR LF (GAUZE/BANDAGES/DRESSINGS) IMPLANT
BANDAGE GAUZE ELAST BULKY 4 IN (GAUZE/BANDAGES/DRESSINGS) IMPLANT
CANISTER SUCTION 2500CC (MISCELLANEOUS) IMPLANT
CATH ROBINSON RED A/P 16FR (CATHETERS) IMPLANT
CLEANER TIP ELECTROSURG 2X2 (MISCELLANEOUS) ×2 IMPLANT
CLOTH BEACON ORANGE TIMEOUT ST (SAFETY) ×2 IMPLANT
CONT SPEC 4OZ CLIKSEAL STRL BL (MISCELLANEOUS) ×2 IMPLANT
CONT SPEC STER OR (MISCELLANEOUS) ×1 IMPLANT
COVER SURGICAL LIGHT HANDLE (MISCELLANEOUS) ×2 IMPLANT
CRADLE DONUT ADULT HEAD (MISCELLANEOUS) IMPLANT
DECANTER SPIKE VIAL GLASS SM (MISCELLANEOUS) ×1 IMPLANT
DERMABOND ADVANCED (GAUZE/BANDAGES/DRESSINGS)
DERMABOND ADVANCED .7 DNX12 (GAUZE/BANDAGES/DRESSINGS) ×1 IMPLANT
DRAIN PENROSE 1/4X12 LTX STRL (WOUND CARE) IMPLANT
DRAIN SNY 10 ROU (WOUND CARE) IMPLANT
DRAIN SNY 7 FPER (WOUND CARE) IMPLANT
DRSG EMULSION OIL 3X3 NADH (GAUZE/BANDAGES/DRESSINGS) IMPLANT
ELECT COATED BLADE 2.86 ST (ELECTRODE) ×2 IMPLANT
ELECT NDL BLADE 2-5/6 (NEEDLE) ×1 IMPLANT
ELECT NEEDLE BLADE 2-5/6 (NEEDLE) ×2 IMPLANT
ELECT REM PT RETURN 9FT ADLT (ELECTROSURGICAL) ×2
ELECTRODE REM PT RTRN 9FT ADLT (ELECTROSURGICAL) ×1 IMPLANT
GAUZE SPONGE 4X4 16PLY XRAY LF (GAUZE/BANDAGES/DRESSINGS) IMPLANT
GLOVE BIOGEL PI IND STRL 7.0 (GLOVE) IMPLANT
GLOVE BIOGEL PI INDICATOR 7.0 (GLOVE) ×1
GLOVE ECLIPSE 7.5 STRL STRAW (GLOVE) ×2 IMPLANT
GLOVE SURG SS PI 6.5 STRL IVOR (GLOVE) ×2 IMPLANT
GLOVE SURG SS PI 8.0 STRL IVOR (GLOVE) ×1 IMPLANT
GOWN STRL NON-REIN LRG LVL3 (GOWN DISPOSABLE) ×5 IMPLANT
GOWN STRL REIN XL XLG (GOWN DISPOSABLE) ×2 IMPLANT
KIT BASIN OR (CUSTOM PROCEDURE TRAY) ×2 IMPLANT
KIT ROOM TURNOVER OR (KITS) ×2 IMPLANT
NDL 25GX 5/8IN NON SAFETY (NEEDLE) IMPLANT
NEEDLE 25GX 5/8IN NON SAFETY (NEEDLE) ×2 IMPLANT
NS IRRIG 1000ML POUR BTL (IV SOLUTION) ×2 IMPLANT
PAD ARMBOARD 7.5X6 YLW CONV (MISCELLANEOUS) ×4 IMPLANT
PENCIL FOOT CONTROL (ELECTRODE) ×2 IMPLANT
POUCH STERILIZING 3 X22 (STERILIZATION PRODUCTS) IMPLANT
SPONGE GAUZE 4X4 12PLY (GAUZE/BANDAGES/DRESSINGS) IMPLANT
STAPLER VISISTAT 35W (STAPLE) ×2 IMPLANT
SUT CHROMIC 4 0 P 3 18 (SUTURE) ×1 IMPLANT
SUT ETHILON 3 0 PS 1 (SUTURE) ×1 IMPLANT
SUT ETHILON 4 0 PS 2 18 (SUTURE) ×1 IMPLANT
SUT ETHILON 5 0 P 3 18 (SUTURE) ×2
SUT NYLON ETHILON 5-0 P-3 1X18 (SUTURE) ×1 IMPLANT
SUT SILK 2 0 FS (SUTURE) ×1 IMPLANT
SUT SILK 4 0 (SUTURE)
SUT SILK 4-0 18XBRD TIE 12 (SUTURE) ×1 IMPLANT
SWAB COLLECTION DEVICE MRSA (MISCELLANEOUS) IMPLANT
SYR BULB IRRIGATION 50ML (SYRINGE) ×1 IMPLANT
SYR TB 1ML LUER SLIP (SYRINGE) IMPLANT
TOWEL OR 17X24 6PK STRL BLUE (TOWEL DISPOSABLE) ×2 IMPLANT
TOWEL OR 17X26 10 PK STRL BLUE (TOWEL DISPOSABLE) ×2 IMPLANT
TRAY ENT MC OR (CUSTOM PROCEDURE TRAY) ×2 IMPLANT
TUBE ANAEROBIC SPECIMEN COL (MISCELLANEOUS) IMPLANT
WATER STERILE IRR 1000ML POUR (IV SOLUTION) ×1 IMPLANT
YANKAUER SUCT BULB TIP NO VENT (SUCTIONS) IMPLANT

## 2012-07-23 NOTE — Anesthesia Postprocedure Evaluation (Signed)
  Anesthesia Post-op Note  Patient: Angel [name]  Procedure(s) Performed: Procedure(s): EXCISION LEFT EAR LOBE (Left)  Patient Location: PACU  Anesthesia Type:General  Level of Consciousness: awake, alert , oriented and patient cooperative  Airway and Oxygen Therapy: Patient Spontanous Breathing  Post-op Pain: mild  Post-op Assessment: Post-op Vital signs reviewed, Patient's Cardiovascular Status Stable, Respiratory Function Stable, Patent Airway, No signs of Nausea or vomiting and Pain level controlled  Post-op Vital Signs: stable  Complications: No apparent anesthesia complications

## 2012-07-23 NOTE — Preoperative (Signed)
Beta Blockers   Reason not to administer Beta Blockers:Not Applicable 

## 2012-07-23 NOTE — H&P (Signed)
Angel [name] is an 72 y.o. male.   Chief Complaint: left ear lesionHPI: hx of lesion of the left ear suspicious for cancer.  Past Medical History  Diagnosis Date  . CAD (coronary artery disease)     BMS RCA 8/11; LVEF 50-55%  . HLD (hyperlipidemia)   . HTN (hypertension)   . MI (myocardial infarction)     MSTEMI 8/11  . Gout   . Pneumonia 2013  . Cancer   . 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 GERD (gastroesophageal reflux disease)   . 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 Rash, skin     on lower legs    Past Surgical History  Procedure Laterality Date  . Coronary angioplasty  2010    Dr Eden Emms    No family history on file. Social History:  reports that he has quit smoking. He does not have any smokeless tobacco history on file. He reports that he does not drink alcohol or use illicit drugs.  Allergies:  Allergies  Allergen Reactions  . Penicillins Other (See Comments)    Per patient causes gi upset with high doses. He says he does OK with shots but gets GI upset with PO penicillins    Medications Prior to Admission  Medication Sig Dispense Refill  . lisinopril (PRINIVIL,ZESTRIL) 30 MG tablet Take 30 mg by mouth daily.       Marland Kitchen aspirin 81 MG EC tablet Take 81 mg by mouth daily.        . nitroGLYCERIN (NITROSTAT) 0.4 MG SL tablet Place 0.4 mg under the tongue every 5 (five) minutes as needed. For chest pain        Results for orders placed during the hospital encounter of 07/21/12 (from the past 48 hour(s))  SURGICAL PCR SCREEN     Status: Abnormal   Collection Time    07/21/12  2:20 PM      Result Value Range   MRSA, PCR NEGATIVE  NEGATIVE   Staphylococcus aureus POSITIVE (*) NEGATIVE   Comment:            The Xpert SA Assay (FDA     approved for NASAL specimens     in patients over 36 years of age),     is one component of     a comprehensive surveillance     program.  Test performance has     been validated by [employer] for patients greater     than or equal to 42 year old.     It is not intended     to diagnose infection nor to     guide or monitor treatment.  BASIC METABOLIC PANEL     Status: Abnormal   Collection Time    07/21/12  2:29 PM      Result Value Range   Sodium 139  135 - 145 mEq/L   Potassium 4.0  3.5 - 5.1 mEq/L   Chloride 103  96 - 112 mEq/L   CO2 26  19 - 32 mEq/L   Glucose, Bld 98  70 - 99 mg/dL   BUN 9  6 - 23 mg/dL   Creatinine, Ser 1.19  0.50 - 1.35 mg/dL   Calcium 8.9  8.4 - 14.7 mg/dL   GFR calc non Af Amer 68 (*) >90 mL/min   GFR calc Af Amer 79 (*) >90 mL/min  Comment:            The eGFR has been calculated     using the CKD EPI equation.     This calculation has not been     validated in all clinical     situations.     eGFR's persistently     <90 mL/min signify     possible Chronic Kidney Disease.  CBC     Status: None   Collection Time    07/21/12  2:29 PM      Result Value Range   WBC 7.3  4.0 - 10.5 K/uL   RBC 4.73  4.22 - 5.81 MIL/uL   Hemoglobin 14.3  13.0 - 17.0 g/dL   HCT 04.5  40.9 - 81.1 %   MCV 86.0  78.0 - 100.0 fL   MCH 30.2  26.0 - 34.0 pg   MCHC 35.1  30.0 - 36.0 g/dL   RDW 91.4  78.2 - 95.6 %   Platelets 182  150 - 400 K/uL   Dg Chest 2 View  07/21/2012  *RADIOLOGY REPORT*  Clinical Data: Preop, ear lobe lesion removal  CHEST - 2 VIEW  Comparison: Chest radiograph 06/26/2011  Findings: Normal mediastinum and cardiac silhouette.  Normal pulmonary  vasculature.  No evidence of effusion, infiltrate, or pneumothorax.  No acute bony abnormality.  IMPRESSION: No acute cardiopulmonary process.   Original Report Authenticated By: Genevive Bi, M.D.     Review of Systems  Constitutional: Negative.   HENT: Negative.   Eyes: Negative.   Respiratory: Negative.   Cardiovascular: Negative.   Skin: Negative.     Blood pressure 179/83, pulse 64, temperature 97.5 F (36.4 C), temperature source Oral, resp. rate 20, SpO2 96.00%. Physical Exam   Constitutional: He appears well-developed.  HENT:  Head: Normocephalic and atraumatic.  Nose: Nose normal.  Mouth/Throat: Oropharynx is clear and moist.  Eyes: Pupils are equal, round, and reactive to light.  Neck: Normal range of motion. Neck supple.  Cardiovascular: Normal rate.   Respiratory: Effort normal.  GI: Soft.     Assessment/Plan Left ear lesion- pt has possible cancer left ear and needs excision. Discussed procedure.  Suzanna Obey 07/23/2012, 7:31 AM

## 2012-07-23 NOTE — Transfer of Care (Signed)
Immediate Anesthesia Transfer of Care Note  Patient: Angel [name]  Procedure(s) Performed: Procedure(s): EXCISION LEFT EAR LOBE (Left)  Patient Location: PACU  Anesthesia Type:General  Level of Consciousness: awake and sedated  Airway & Oxygen Therapy: Patient Spontanous Breathing and Patient connected to face mask oxygen  Post-op Assessment: Report given to PACU RN, Post -op Vital signs reviewed and stable, Patient moving all extremities and Patient moving all extremities X 4  Post vital signs: Reviewed and stable  Complications: No apparent anesthesia complications

## 2012-07-23 NOTE — Op Note (Signed)
Preop/postop diagnosis: Left ear lesion Procedure: Partial resection of left ear Anesthesia: Gen. Estimated blood loss: Less than 5 cc Indications: 72 year old with a lesion on the left side of his ear on the lobe that has enlarged since the last visit. 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 We discussed the procedure. We discussed risks, benefits, and options and all questions are answered and consent was obtained. Procedure: Patient was taken to the operating room placed in the supine position after general endotracheal tube anesthesia was prepped and draped in the usual sterile manner. The left ear was examined and using a marking pen an  incision was outlined with a palpable adequate margin based on the induration of the tissue. This was drawn across the base of the ear lobe. The electrocautery was used to remove this with dissection through the soft tissue and fat completely resecting the lobe of the ear to the point of the inferior aspect of the cartilaginous portion of the ear. There was purulent material coming from the wound and cancer. Once this was removed frozen section margin was sent and it was negative for tumor. Based on the purulent material the patient did not undergo an advancement flap and this can be performed a later date for reconstruction of his earlobe. The wound was closed with interrupted 4-0 nylon after elevating superior and inferior flaps to allow closure over the cartilaginous portion. Was good hemostasis. He was awake and brought to cover stable condition counts correct

## 2012-07-23 NOTE — Anesthesia Preprocedure Evaluation (Signed)
Anesthesia Evaluation  Patient identified by MRN, date of birth, ID band Patient awake    Reviewed: Allergy & Precautions, H&P , NPO status , Patient's Chart, lab work & pertinent test results  Airway Mallampati: I TM Distance: >3 FB Neck ROM: full    Dental   Pulmonary          Cardiovascular hypertension, + CAD and + Peripheral Vascular Disease Rhythm:regular Rate:Normal     Neuro/Psych    GI/Hepatic GERD-  ,  Endo/Other    Renal/GU      Musculoskeletal   Abdominal   Peds  Hematology   Anesthesia Other Findings   Reproductive/Obstetrics                           Anesthesia Physical Anesthesia Plan  ASA: III  Anesthesia Plan: General   Post-op Pain Management:    Induction: Intravenous  Airway Management Planned: Oral ETT  Additional Equipment:   Intra-op Plan:   Post-operative Plan: Extubation in OR  Informed Consent: I have reviewed the patients History and Physical, chart, labs and discussed the procedure including the risks, benefits and alternatives for the proposed anesthesia with the patient or authorized representative who has indicated his/her understanding and acceptance.     Plan Discussed with: CRNA, Anesthesiologist and Surgeon  Anesthesia Plan Comments:         Anesthesia Quick Evaluation

## 2012-07-23 NOTE — Anesthesia Procedure Notes (Addendum)
Performed by: Coralee Rud   Procedure Name: Intubation Date/Time: 07/23/2012 7:53 AM Performed by: Coralee Rud Pre-anesthesia Checklist: Patient identified, Emergency Drugs available, Suction available, Patient being monitored and Timeout performed Patient Re-evaluated:Patient Re-evaluated prior to inductionOxygen Delivery Method: Circle system utilized and Simple face mask Preoxygenation: Pre-oxygenation with 100% oxygen Intubation Type: IV induction Ventilation: Mask ventilation without difficulty Laryngoscope Size: Miller and 3 Grade View: Grade I Tube type: Oral Tube size: 8.0 mm Number of attempts: 1 Airway Equipment and Method: Stylet and LTA kit utilized Placement Confirmation: ETT inserted through vocal cords under direct vision,  breath sounds checked- equal and bilateral and positive ETCO2 Secured at: 23 cm Tube secured with: Tape Dental Injury: Teeth and Oropharynx as per pre-operative assessment

## 2012-07-23 NOTE — Discharge Instructions (Signed)
Followup in one week. Call if the wound is getting more red, swollen, hurting, or any fever. Apply antibiotic cream to the wound twice a day. Take Vicodin only if necessary and try to take Motrin or Tylenol instead. Do not take the Vicodin and Tylenol together.

## 2012-07-25 ENCOUNTER — Encounter (HOSPITAL_COMMUNITY): Payer: Self-pay | Admitting: *Deleted

## 2012-07-25 ENCOUNTER — Emergency Department (HOSPITAL_COMMUNITY)
Admission: EM | Admit: 2012-07-25 | Discharge: 2012-07-25 | Disposition: A | Discharge status: Home / Self Care | Payer: Medicare Other | Attending: Emergency Medicine | Admitting: Emergency Medicine

## 2012-07-25 DIAGNOSIS — Z85828 Personal history of other malignant neoplasm of skin: Secondary | ICD-10-CM | POA: Insufficient documentation

## 2012-07-25 DIAGNOSIS — E785 Hyperlipidemia, unspecified: Secondary | ICD-10-CM | POA: Insufficient documentation

## 2012-07-25 DIAGNOSIS — Z872 Personal history of diseases of the skin and subcutaneous tissue: Secondary | ICD-10-CM | POA: Insufficient documentation

## 2012-07-25 DIAGNOSIS — M109 Gout, unspecified: Secondary | ICD-10-CM | POA: Insufficient documentation

## 2012-07-25 DIAGNOSIS — I10 Essential (primary) hypertension: Secondary | ICD-10-CM | POA: Insufficient documentation

## 2012-07-25 DIAGNOSIS — Z7982 Long term (current) use of aspirin: Secondary | ICD-10-CM | POA: Insufficient documentation

## 2012-07-25 DIAGNOSIS — Z8719 Personal history of other diseases of the digestive system: Secondary | ICD-10-CM | POA: Insufficient documentation

## 2012-07-25 DIAGNOSIS — N410 Acute prostatitis: Secondary | ICD-10-CM | POA: Insufficient documentation

## 2012-07-25 DIAGNOSIS — I2510 Atherosclerotic heart disease of native coronary artery without angina pectoris: Secondary | ICD-10-CM | POA: Insufficient documentation

## 2012-07-25 DIAGNOSIS — Z79899 Other long term (current) drug therapy: Secondary | ICD-10-CM | POA: Insufficient documentation

## 2012-07-25 DIAGNOSIS — Z87891 Personal history of nicotine dependence: Secondary | ICD-10-CM | POA: Insufficient documentation

## 2012-07-25 DIAGNOSIS — I252 Old myocardial infarction: Secondary | ICD-10-CM | POA: Insufficient documentation

## 2012-07-25 DIAGNOSIS — I8390 Asymptomatic varicose veins of unspecified lower extremity: Secondary | ICD-10-CM | POA: Insufficient documentation

## 2012-07-25 DIAGNOSIS — Z8701 Personal history of pneumonia (recurrent): Secondary | ICD-10-CM | POA: Insufficient documentation

## 2012-07-25 LAB — URINALYSIS, ROUTINE W REFLEX MICROSCOPIC
Bilirubin Urine: NEGATIVE
Glucose, UA: NEGATIVE mg/dL
Hgb urine dipstick: NEGATIVE
Ketones, ur: NEGATIVE mg/dL
Protein, ur: NEGATIVE mg/dL
Urobilinogen, UA: 0.2 mg/dL (ref 0.0–1.0)

## 2012-07-25 MED ORDER — CIPROFLOXACIN HCL 500 MG PO TABS: 500.0000 mg | ORAL_TABLET | Freq: Two times a day (BID) | ORAL | Status: DC

## 2012-07-25 MED ORDER — TAMSULOSIN HCL 0.4 MG PO CAPS
0.4000 mg | ORAL_CAPSULE | Freq: Once | ORAL | Status: AC
  Administered 2012-07-25: 0.4 mg via ORAL
  Filled 2012-07-25: qty 1

## 2012-07-25 MED ORDER — TAMSULOSIN HCL 0.4 MG PO CAPS: 0.4000 mg | ORAL_CAPSULE | Freq: Every day | ORAL | Status: DC

## 2012-07-25 MED ORDER — CIPROFLOXACIN HCL 500 MG PO TABS
500.0000 mg | ORAL_TABLET | Freq: Once | ORAL | Status: AC
  Administered 2012-07-25: 500 mg via ORAL
  Filled 2012-07-25: qty 1

## 2012-07-25 NOTE — ED Notes (Signed)
Mentions: had surgery on his earlobe Thursday (to remove cancerous lesion). Here tonight for urinary retention. Scant return with each void. Also urgency and dysuria. Not sure if r/t meds given. Unsure if I had a catheter in for the surgery.

## 2012-07-25 NOTE — ED Provider Notes (Signed)
History     CSN: [id number]  Arrival date & time 07/25/12  0416   First MD Initiated Contact with Patient 07/25/12 [phone]      Chief Complaint  Patient presents with  . Urinary Retention    (Consider location/radiation/quality/duration/timing/severity/associated sxs/prior treatment) HPI This is a 71 year old male who had his left earlobe surgically removed 2 days ago due to a cancerous tumor. This was done under general anesthesia. Since that time he has had urinary urgency and difficulty urinating. Specifically he has to push harder than usual to void and he is only voiding small amounts. This morning the symptoms worsened and he has developed pain in his penis when he urinates. The symptoms are moderate. He has not had fever or chills.  Past Medical History  Diagnosis Date  . CAD (coronary artery disease)     BMS RCA 8/11; LVEF 50-55%  . HLD (hyperlipidemia)   . HTN (hypertension)   . MI (myocardial infarction)     MSTEMI 8/11  . Gout   . Pneumonia 2013  . Cancer   . 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 GERD (gastroesophageal reflux disease)   . 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 Rash, skin     on lower legs    Past Surgical History  Procedure Laterality Date  . Coronary angioplasty  2010    Dr Eden Emms    No family history on file.  History  Substance Use Topics  . Smoking status: Former Games developer  . Smokeless tobacco: Not on file     Comment: quit 9/11  . Alcohol Use: No      Review of Systems  All other systems reviewed and are negative.    Allergies  Penicillins  Home Medications   Current Outpatient Rx  Name  Route  Sig  Dispense  Refill  . aspirin 81 MG EC tablet   Oral   Take 81 mg by mouth daily.           Marland Kitchen HYDROcodone-acetaminophen (VICODIN) 5-500 MG per tablet   Oral   Take 1 tablet by mouth every 6 (six) hours as needed for pain.   30 tablet   0   . lisinopril (PRINIVIL,ZESTRIL) 30 MG tablet  Oral   Take 30 mg by mouth daily.          . nitroGLYCERIN (NITROSTAT) 0.4 MG SL tablet   Sublingual   Place 0.4 mg under the tongue every 5 (five) minutes as needed. For chest pain           BP 186/70  Pulse 66  Temp(Src) 97.6 F (36.4 C) (Oral)  Resp 18  SpO2 94%  Physical Exam General: Well-developed, well-nourished male in no acute distress; appearance consistent with age of record HENT: normocephalic, surgical amputation of the left earlobe without evidence of wound infection Eyes: pupils equal round and reactive to light; extraocular muscles intact Neck: supple Heart: regular rate and rhythm Lungs: clear to auscultation bilaterally Abdomen: soft; nondistended; bladder mildly tender without significant distention; bowel sounds present GU: Tanner 4 male; prostate enlarged and tender Extremities: No deformity; full range of motion; edema and chronic-appearing stasis changes of lower legs Neurologic: Awake, alert and oriented; motor function intact in all extremities and symmetric; no facial droop Skin: Warm and dry Psychiatric: Normal mood and affect    ED Course  Procedures (including critical care time)  MDM   Nursing notes and vitals signs, including pulse oximetry, reviewed.  Summary of this visit's results, reviewed by myself:  Labs:  Results for orders placed during the hospital encounter of 07/25/12 (from the past 24 hour(s))  URINALYSIS, ROUTINE W REFLEX MICROSCOPIC     Status: None   Collection Time    07/25/12  4:46 AM      Result Value Range   Color, Urine YELLOW  YELLOW   APPearance CLEAR  CLEAR   Specific Gravity, Urine 1.007  1.005 - 1.030   pH 7.0  5.0 - 8.0   Glucose, UA NEGATIVE  NEGATIVE mg/dL   Hgb urine dipstick NEGATIVE  NEGATIVE   Bilirubin Urine NEGATIVE  NEGATIVE   Ketones, ur NEGATIVE  NEGATIVE mg/dL   Protein, ur NEGATIVE  NEGATIVE mg/dL   Urobilinogen, UA 0.2  0.0 - 1.0 mg/dL   Nitrite NEGATIVE  NEGATIVE   Leukocytes,  UA NEGATIVE  NEGATIVE   6:24 AM Examination consistent with prostatitis; prostatitis can cause referred pain to the penis during urination. Because he is able to void, albeit not as freely as is usual for him, and he is not in significant discomfort, we will not place a Foley catheter at this time.       Hanley Seamen, MD 07/25/12 [phone]

## 2012-07-27 LAB — URINE CULTURE: Colony Count: NO GROWTH

## 2012-09-08 ENCOUNTER — Encounter (HOSPITAL_COMMUNITY): Payer: Self-pay | Admitting: *Deleted

## 2012-09-08 ENCOUNTER — Emergency Department (HOSPITAL_COMMUNITY)
Admission: EM | Admit: 2012-09-08 | Discharge: 2012-09-08 | Disposition: A | Discharge status: Home / Self Care | Payer: Medicare Other | Attending: Emergency Medicine | Admitting: Emergency Medicine

## 2012-09-08 DIAGNOSIS — Z9861 Coronary angioplasty status: Secondary | ICD-10-CM | POA: Insufficient documentation

## 2012-09-08 DIAGNOSIS — Z862 Personal history of diseases of the blood and blood-forming organs and certain disorders involving the immune mechanism: Secondary | ICD-10-CM | POA: Insufficient documentation

## 2012-09-08 DIAGNOSIS — I10 Essential (primary) hypertension: Secondary | ICD-10-CM | POA: Insufficient documentation

## 2012-09-08 DIAGNOSIS — Z85828 Personal history of other malignant neoplasm of skin: Secondary | ICD-10-CM | POA: Insufficient documentation

## 2012-09-08 DIAGNOSIS — Z8701 Personal history of pneumonia (recurrent): Secondary | ICD-10-CM | POA: Insufficient documentation

## 2012-09-08 DIAGNOSIS — I2510 Atherosclerotic heart disease of native coronary artery without angina pectoris: Secondary | ICD-10-CM | POA: Insufficient documentation

## 2012-09-08 DIAGNOSIS — Z872 Personal history of diseases of the skin and subcutaneous tissue: Secondary | ICD-10-CM | POA: Insufficient documentation

## 2012-09-08 DIAGNOSIS — Y92009 Unspecified place in unspecified non-institutional (private) residence as the place of occurrence of the external cause: Secondary | ICD-10-CM | POA: Insufficient documentation

## 2012-09-08 DIAGNOSIS — I252 Old myocardial infarction: Secondary | ICD-10-CM | POA: Insufficient documentation

## 2012-09-08 DIAGNOSIS — Z79899 Other long term (current) drug therapy: Secondary | ICD-10-CM | POA: Insufficient documentation

## 2012-09-08 DIAGNOSIS — Z8719 Personal history of other diseases of the digestive system: Secondary | ICD-10-CM | POA: Insufficient documentation

## 2012-09-08 DIAGNOSIS — S43499A Other sprain of unspecified shoulder joint, initial encounter: Secondary | ICD-10-CM | POA: Insufficient documentation

## 2012-09-08 DIAGNOSIS — Z87891 Personal history of nicotine dependence: Secondary | ICD-10-CM | POA: Insufficient documentation

## 2012-09-08 DIAGNOSIS — S46812A Strain of other muscles, fascia and tendons at shoulder and upper arm level, left arm, initial encounter: Secondary | ICD-10-CM

## 2012-09-08 DIAGNOSIS — M109 Gout, unspecified: Secondary | ICD-10-CM | POA: Insufficient documentation

## 2012-09-08 DIAGNOSIS — S0993XA Unspecified injury of face, initial encounter: Secondary | ICD-10-CM | POA: Insufficient documentation

## 2012-09-08 DIAGNOSIS — Z8639 Personal history of other endocrine, nutritional and metabolic disease: Secondary | ICD-10-CM | POA: Insufficient documentation

## 2012-09-08 DIAGNOSIS — X503XXA Overexertion from repetitive movements, initial encounter: Secondary | ICD-10-CM | POA: Insufficient documentation

## 2012-09-08 DIAGNOSIS — Z8679 Personal history of other diseases of the circulatory system: Secondary | ICD-10-CM | POA: Insufficient documentation

## 2012-09-08 DIAGNOSIS — S199XXA Unspecified injury of neck, initial encounter: Secondary | ICD-10-CM | POA: Insufficient documentation

## 2012-09-08 DIAGNOSIS — Y9389 Activity, other specified: Secondary | ICD-10-CM | POA: Insufficient documentation

## 2012-09-08 DIAGNOSIS — Z7982 Long term (current) use of aspirin: Secondary | ICD-10-CM | POA: Insufficient documentation

## 2012-09-08 MED ORDER — METHOCARBAMOL 500 MG PO TABS: 500.0000 mg | ORAL_TABLET | Freq: Two times a day (BID) | ORAL | Status: DC

## 2012-09-08 MED ORDER — TRAMADOL HCL 50 MG PO TABS: 50.0000 mg | ORAL_TABLET | Freq: Four times a day (QID) | ORAL | Status: DC | PRN

## 2012-09-08 NOTE — ED Provider Notes (Deleted)
History     CSN: [id number]  Arrival date & time 09/08/12  1536   First MD Initiated Contact with Patient 09/08/12 1628      Chief Complaint  Patient presents with  . Neck Pain  . Shoulder Pain    (Consider location/radiation/quality/duration/timing/severity/associated sxs/prior treatment) HPI  Past Medical History  Diagnosis Date  . CAD (coronary artery disease)     BMS RCA 8/11; LVEF 50-55%  . HLD (hyperlipidemia)   . HTN (hypertension)   . MI (myocardial infarction)     MSTEMI 8/11  . Gout   . Pneumonia 2013  . Cancer   . 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 GERD (gastroesophageal reflux disease)   . 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 Rash, skin     on lower legs    Past Surgical History  Procedure Laterality Date  . Coronary angioplasty  2010    Dr Eden Emms  . Ear cyst excision Left 07/23/2012    Procedure: EXCISION LEFT EAR LOBE;  Surgeon: Suzanna Obey, MD;  Location: [facility] OR;  Service: ENT;  Laterality: Left;    No family history on file.  History  Substance Use Topics  . Smoking status: Former Games developer  . Smokeless tobacco: Not on file     Comment: quit 9/11  . Alcohol Use: No      Review of Systems  Allergies  Penicillins  Home Medications   Current Outpatient Rx  Name  Route  Sig  Dispense  Refill  . aspirin 81 MG EC tablet   Oral   Take 81 mg by mouth daily.           . ciprofloxacin (CIPRO) 500 MG tablet   Oral   Take 1 tablet (500 mg total) by mouth 2 (two) times daily.   28 tablet   0   . HYDROcodone-acetaminophen (VICODIN) 5-500 MG per tablet   Oral   Take 1 tablet by mouth every 6 (six) hours as needed for pain.   30 tablet   0   . lisinopril (PRINIVIL,ZESTRIL) 30 MG tablet   Oral   Take 30 mg by mouth daily.          . methocarbamol (ROBAXIN) 500 MG tablet   Oral   Take 1 tablet (500 mg total) by mouth 2 (two) times daily.   20 tablet   0   . nitroGLYCERIN (NITROSTAT) 0.4 MG SL  tablet   Sublingual   Place 0.4 mg under the tongue every 5 (five) minutes as needed. For chest pain         . tamsulosin (FLOMAX) 0.4 MG CAPS   Oral   Take 1 capsule (0.4 mg total) by mouth daily.   15 capsule   0   . traMADol (ULTRAM) 50 MG tablet   Oral   Take 1 tablet (50 mg total) by mouth every 6 (six) hours as needed for pain.   15 tablet   0     BP 209/79  Pulse 73  Temp(Src) 97 F (36.1 C) (Oral)  Resp 20  SpO2 98%  Physical Exam  ED Course  Procedures (including critical care time)  Labs Reviewed - No data to display No results found.   1. Trapezius strain, left, initial encounter      Date: 09/08/2012  Rate:74  Rhythm: normal sinus rhythm  QRS Axis: normal  Intervals: normal  ST/T Wave abnormalities: normal  Conduction Disutrbances:none  Narrative Interpretation:   Old EKG Reviewed: unchanged    MDM  I personally performed the services described in this documentation, which was scribed in my presence. The recorded information has been reviewed and is accurate.          Loren Racer, MD 09/08/12 [phone]

## 2012-09-08 NOTE — ED Provider Notes (Signed)
History  This chart was scribed for Angel Racer, MD, by Candelaria Stagers, ED Scribe. This patient was seen in room APA05/AP[facility]05 and the patient's care was started at 4:45 PM   CSN: [id number]  Arrival date & time 09/08/12  1536   First MD Initiated Contact with Patient 09/08/12 1628      Chief Complaint  Patient presents with  . Neck Pain  . Shoulder Pain     The history is provided by the patient. No language interpreter was used.   Angel [name] is a 72 y.o. male who presents to the Emergency Department complaining of gradual onset of left sided neck pain and left shoulder pain that started this morning.  Pt reports doing more yard work and house repairs than normal over the last four days.  He reports his pain is worse with movement.  Pt takes aspirin daily.  Nothing seems to make the sx better or worse.     Past Medical History  Diagnosis Date  . CAD (coronary artery disease)     BMS RCA 8/11; LVEF 50-55%  . HLD (hyperlipidemia)   . HTN (hypertension)   . MI (myocardial infarction)     MSTEMI 8/11  . Gout   . Pneumonia 2013  . Cancer   . 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 GERD (gastroesophageal reflux disease)   . 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 Rash, skin     on lower legs    Past Surgical History  Procedure Laterality Date  . Coronary angioplasty  2010    Dr Eden Emms  . Ear cyst excision Left 07/23/2012    Procedure: EXCISION LEFT EAR LOBE;  Surgeon: Suzanna Obey, MD;  Location: [facility] OR;  Service: ENT;  Laterality: Left;    No family history on file.  History  Substance Use Topics  . Smoking status: Former Games developer  . Smokeless tobacco: Not on file     Comment: quit 9/11  . Alcohol Use: No      Review of Systems  Musculoskeletal: Positive for arthralgias (left shoulder pain).    Allergies  Penicillins  Home Medications   Current Outpatient Rx  Name  Route  Sig  Dispense  Refill  . aspirin EC 81 MG  tablet   Oral   Take 81 mg by mouth daily.         Marland Kitchen lisinopril (PRINIVIL,ZESTRIL) 30 MG tablet   Oral   Take 30 mg by mouth daily.          . methocarbamol (ROBAXIN) 500 MG tablet   Oral   Take 1 tablet (500 mg total) by mouth 2 (two) times daily.   20 tablet   0   . nitroGLYCERIN (NITROSTAT) 0.4 MG SL tablet   Sublingual   Place 0.4 mg under the tongue every 5 (five) minutes as needed. For chest pain         . predniSONE (DELTASONE) 20 MG tablet   Oral   Take 1 tablet (20 mg total) by mouth 2 (two) times daily.   10 tablet   0   . traMADol (ULTRAM) 50 MG tablet   Oral   Take 1 tablet (50 mg total) by mouth every 6 (six) hours as needed for pain.   15 tablet   0     BP 209/79  Pulse 73  Temp(Src) 97 F (36.1 C) (Oral)  Resp 20  SpO2 98%  Physical Exam  Constitutional: He is oriented to person, place, and time. He appears well-developed and well-nourished.  HENT:  Head: Normocephalic and atraumatic.  Mouth/Throat: Oropharynx is clear and moist.  Eyes: Conjunctivae and EOM are normal. Pupils are equal, round, and reactive to light.  Neck: Normal range of motion. Neck supple.  Cardiovascular: Normal rate, regular rhythm and normal heart sounds.   Pulmonary/Chest: Effort normal and breath sounds normal.  Abdominal: Soft. Bowel sounds are normal. There is no tenderness.  Musculoskeletal: Normal range of motion.  Tenderness to palpation over left trapezius.  Good radial pulses.  No midline cervical tenderness.  No thoracic midline tenderness.  Good grip strength.   Neurological: He is alert and oriented to person, place, and time.  Skin: Skin is warm and dry.  Psychiatric: He has a normal mood and affect.    ED Course  Procedures  DIAGNOSTIC STUDIES: Oxygen Saturation is 98% on room air, normal by my interpretation.    COORDINATION OF CARE:  4:48 PM Discussed course of care with pt which includes pain medication and continued use of aspirin.  Pt  understands and agrees.    Labs Reviewed - No data to display No results found.   1. Trapezius strain, left, initial encounter       MDM  I personally performed the services described in this documentation, which was scribed in my presence. The recorded information has been reviewed and is accurate.          Angel Racer, MD 09/18/12 [phone]

## 2012-09-08 NOTE — ED Notes (Signed)
Pt states that he helped to take down his ceiling on Friday, mowed and weeded his yard yesterday, woke up with left neck and shoulder pain today, pain is worse with movement,

## 2012-09-11 ENCOUNTER — Emergency Department (HOSPITAL_COMMUNITY)
Admission: EM | Admit: 2012-09-11 | Discharge: 2012-09-11 | Disposition: A | Discharge status: Home / Self Care | Payer: Medicare Other | Attending: Emergency Medicine | Admitting: Emergency Medicine

## 2012-09-11 ENCOUNTER — Emergency Department (HOSPITAL_COMMUNITY): Payer: Medicare Other

## 2012-09-11 ENCOUNTER — Encounter (HOSPITAL_COMMUNITY): Payer: Self-pay | Admitting: Emergency Medicine

## 2012-09-11 DIAGNOSIS — Z862 Personal history of diseases of the blood and blood-forming organs and certain disorders involving the immune mechanism: Secondary | ICD-10-CM | POA: Insufficient documentation

## 2012-09-11 DIAGNOSIS — Z8701 Personal history of pneumonia (recurrent): Secondary | ICD-10-CM | POA: Insufficient documentation

## 2012-09-11 DIAGNOSIS — Z8679 Personal history of other diseases of the circulatory system: Secondary | ICD-10-CM | POA: Insufficient documentation

## 2012-09-11 DIAGNOSIS — Z7982 Long term (current) use of aspirin: Secondary | ICD-10-CM | POA: Insufficient documentation

## 2012-09-11 DIAGNOSIS — M25559 Pain in unspecified hip: Secondary | ICD-10-CM | POA: Insufficient documentation

## 2012-09-11 DIAGNOSIS — I10 Essential (primary) hypertension: Secondary | ICD-10-CM | POA: Insufficient documentation

## 2012-09-11 DIAGNOSIS — M542 Cervicalgia: Secondary | ICD-10-CM | POA: Insufficient documentation

## 2012-09-11 DIAGNOSIS — I2510 Atherosclerotic heart disease of native coronary artery without angina pectoris: Secondary | ICD-10-CM | POA: Insufficient documentation

## 2012-09-11 DIAGNOSIS — Z87891 Personal history of nicotine dependence: Secondary | ICD-10-CM | POA: Insufficient documentation

## 2012-09-11 DIAGNOSIS — Z8719 Personal history of other diseases of the digestive system: Secondary | ICD-10-CM | POA: Insufficient documentation

## 2012-09-11 DIAGNOSIS — Z79899 Other long term (current) drug therapy: Secondary | ICD-10-CM | POA: Insufficient documentation

## 2012-09-11 DIAGNOSIS — I252 Old myocardial infarction: Secondary | ICD-10-CM | POA: Insufficient documentation

## 2012-09-11 DIAGNOSIS — Z880 Allergy status to penicillin: Secondary | ICD-10-CM | POA: Insufficient documentation

## 2012-09-11 DIAGNOSIS — Z8639 Personal history of other endocrine, nutritional and metabolic disease: Secondary | ICD-10-CM | POA: Insufficient documentation

## 2012-09-11 DIAGNOSIS — Z85828 Personal history of other malignant neoplasm of skin: Secondary | ICD-10-CM | POA: Insufficient documentation

## 2012-09-11 MED ORDER — PREDNISONE 20 MG PO TABS: 20.0000 mg | ORAL_TABLET | Freq: Two times a day (BID) | ORAL | Status: DC

## 2012-09-11 MED ORDER — PREDNISONE 20 MG PO TABS
60.0000 mg | ORAL_TABLET | Freq: Once | ORAL | Status: AC
  Administered 2012-09-11: 60 mg via ORAL
  Filled 2012-09-11: qty 3

## 2012-09-11 NOTE — ED Notes (Signed)
MD at bedside. 

## 2012-09-11 NOTE — ED Notes (Signed)
Pt states that he has had an MI in the past. States that his only pain was in the back of his neck.pt reports pain in his neck and generalized weakness today.  Pts family also reports that he was disoriented on wednesday.

## 2012-09-11 NOTE — ED Provider Notes (Signed)
History     CSN: [id number]  Arrival date & time 09/11/12  1151   First MD Initiated Contact with Patient 09/11/12 1235      Chief Complaint  Patient presents with  . Neck Pain  . Hip Pain    (Consider location/radiation/quality/duration/timing/severity/associated sxs/prior treatment) HPI Comments: Angel [name] is a 72 y.o. male who presents for evaluation of  neck pain after working to get on a ceiling and in the yard several days ago. He was seen 2 days ago at an outlying ED, and treated with tramadol and methocarbamol. Tramadol made him sleepy. He did not take the methocarbamol. He has persistent sharp neck pain that is worse with movement. There are no radicular symptoms. He denies fever, chills, cough, shortness of breath, chest pain, weakness, or dizziness.  Patient is a 72 y.o. male presenting with neck pain and hip pain. The history is provided by the patient.  Neck Pain Hip Pain    Past Medical History  Diagnosis Date  . CAD (coronary artery disease)     BMS RCA 8/11; LVEF 50-55%  . HLD (hyperlipidemia)   . HTN (hypertension)   . MI (myocardial infarction)     MSTEMI 8/11  . Gout   . Pneumonia 2013  . Cancer   . 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 GERD (gastroesophageal reflux disease)   . 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 Rash, skin     on lower legs    Past Surgical History  Procedure Laterality Date  . Coronary angioplasty  2010    Dr Eden Emms  . Ear cyst excision Left 07/23/2012    Procedure: EXCISION LEFT EAR LOBE;  Surgeon: Suzanna Obey, MD;  Location: [facility] OR;  Service: ENT;  Laterality: Left;    History reviewed. No pertinent family history.  History  Substance Use Topics  . Smoking status: Former Games developer  . Smokeless tobacco: Not on file     Comment: quit 9/11  . Alcohol Use: No      Review of Systems  HENT: Positive for neck pain.   All other systems reviewed and are negative.    Allergies   Penicillins  Home Medications   Current Outpatient Rx  Name  Route  Sig  Dispense  Refill  . aspirin EC 81 MG tablet   Oral   Take 81 mg by mouth daily.         Marland Kitchen lisinopril (PRINIVIL,ZESTRIL) 30 MG tablet   Oral   Take 30 mg by mouth daily.          . methocarbamol (ROBAXIN) 500 MG tablet   Oral   Take 1 tablet (500 mg total) by mouth 2 (two) times daily.   20 tablet   0   . nitroGLYCERIN (NITROSTAT) 0.4 MG SL tablet   Sublingual   Place 0.4 mg under the tongue every 5 (five) minutes as needed. For chest pain         . traMADol (ULTRAM) 50 MG tablet   Oral   Take 1 tablet (50 mg total) by mouth every 6 (six) hours as needed for pain.   15 tablet   0   . predniSONE (DELTASONE) 20 MG tablet   Oral   Take 1 tablet (20 mg total) by mouth 2 (two) times daily.   10 tablet   0     BP 130/65  Pulse  63  Temp(Src) 97.2 F (36.2 C) (Oral)  Resp 23  SpO2 96%  Physical Exam  Nursing note and vitals reviewed. Constitutional: He is oriented to person, place, and time. He appears well-developed.  Elderly frail  HENT:  Head: Normocephalic and atraumatic.  Right Ear: External ear normal.  Left Ear: External ear normal.  Eyes: Conjunctivae and EOM are normal. Pupils are equal, round, and reactive to light.  Neck: Normal range of motion and phonation normal. Neck supple.  Cardiovascular: Normal rate, regular rhythm, normal heart sounds and intact distal pulses.   Pulmonary/Chest: Effort normal and breath sounds normal. He exhibits no bony tenderness.  Abdominal: Soft. Normal appearance. There is no tenderness.  Musculoskeletal:  Mild right neck tenderness, laterally. Decreased extension secondary to pain, but normal flexion of the neck.  Neurological: He is alert and oriented to person, place, and time. He has normal strength. No cranial nerve deficit or sensory deficit. He exhibits normal muscle tone. Coordination normal.  Skin: Skin is warm, dry and intact.   Psychiatric: He has a normal mood and affect. His behavior is normal. Judgment and thought content normal.    ED Course  Procedures (including critical care time)  Medications  predniSONE (DELTASONE) tablet 60 mg (60 mg Oral Given 09/11/12 1346)      Date: 09/11/12  Rate: 66  Rhythm: normal sinus rhythm  QRS Axis: normal  PR and QT Intervals: normal  ST/T Wave abnormalities: normal  PR and QRS Conduction Disutrbances:none  Narrative Interpretation: Q wave inferiorly  Old EKG Reviewed: unchanged    Labs Reviewed - No data to display Dg Cervical Spine Complete  09/11/2012  *RADIOLOGY REPORT*  Clinical Data: Posterior left neck pain, no trauma  CERVICAL SPINE - COMPLETE 4+ VIEW  Comparison: None.  Findings: C1 through the cervical thoracic junction is visualized in its entirety.  Normal alignment. No precervical soft tissue widening is present.  No fracture or dislocation identified.  The patient head is tilted slightly to the left on the AP view. The dens is intact and well situated between the lateral masses.  IMPRESSION: No acute abnormality.   Original Report Authenticated By: Christiana Pellant, M.D.      1. Neck pain       MDM  Nonspecific neck pain with negative plain imaging. Differential diagnosis includes, muscle strain, disc injury, nerve impingement. Symptoms are short-lived, and relatively mild. He is stable for discharge.  Nursing Notes Reviewed/ Care Coordinated, and agree without changes. Applicable Imaging Reviewed.  Interpretation of Laboratory Data incorporated into ED treatment   Plan: Home Medications- Prednisone; Home Treatments- Heat; Recommended follow up- PCP 1 week for check up        Flint Melter, MD 09/11/12 [phone]

## 2012-09-11 NOTE — ED Notes (Signed)
Pt sts helped pull down ceiling couple of days ago and now c/o neck pain nad lower back and right hip pain; pt sts feels like he strained it

## 2012-09-11 NOTE — ED Notes (Signed)
Pt undressing and getting into a gown at this time 

## 2012-09-11 NOTE — ED Notes (Signed)
Son at bedside.

## 2012-10-14 ENCOUNTER — Other Ambulatory Visit: Payer: Self-pay | Admitting: *Deleted

## 2012-10-14 ENCOUNTER — Telehealth: Payer: Self-pay | Admitting: *Deleted

## 2012-10-14 MED ORDER — NITROGLYCERIN 0.4 MG SL SUBL: 0.4000 mg | SUBLINGUAL_TABLET | SUBLINGUAL | Status: DC | PRN

## 2012-10-14 NOTE — Telephone Encounter (Signed)
PT NEEDS NITRO CALLED IN TO [facility]

## 2012-10-14 NOTE — Telephone Encounter (Signed)
Done

## 2012-12-16 ENCOUNTER — Other Ambulatory Visit: Payer: Self-pay

## 2013-03-18 ENCOUNTER — Other Ambulatory Visit: Payer: Self-pay

## 2014-11-07 ENCOUNTER — Other Ambulatory Visit: Payer: Self-pay

## 2015-03-15 ENCOUNTER — Emergency Department (HOSPITAL_COMMUNITY)
Admission: EM | Admit: 2015-03-15 | Discharge: 2015-03-15 | Discharge status: Against Medical Advice | Payer: Medicare Other | Attending: Emergency Medicine | Admitting: Emergency Medicine

## 2015-03-15 ENCOUNTER — Encounter (HOSPITAL_COMMUNITY): Payer: Self-pay | Admitting: *Deleted

## 2015-03-15 DIAGNOSIS — K625 Hemorrhage of anus and rectum: Secondary | ICD-10-CM | POA: Diagnosis not present

## 2015-03-15 DIAGNOSIS — I252 Old myocardial infarction: Secondary | ICD-10-CM | POA: Insufficient documentation

## 2015-03-15 DIAGNOSIS — I10 Essential (primary) hypertension: Secondary | ICD-10-CM | POA: Diagnosis not present

## 2015-03-15 DIAGNOSIS — I2510 Atherosclerotic heart disease of native coronary artery without angina pectoris: Secondary | ICD-10-CM | POA: Diagnosis not present

## 2015-03-15 DIAGNOSIS — K5900 Constipation, unspecified: Secondary | ICD-10-CM | POA: Insufficient documentation

## 2015-03-15 LAB — COMPREHENSIVE METABOLIC PANEL
ALBUMIN: 3.7 g/dL (ref 3.5–5.0)
ALK PHOS: 66 U/L (ref 38–126)
ALT: 11 U/L — AB (ref 17–63)
AST: 16 U/L (ref 15–41)
Anion gap: 9 (ref 5–15)
BUN: 10 mg/dL (ref 6–20)
CALCIUM: 9 mg/dL (ref 8.9–10.3)
CHLORIDE: 103 mmol/L (ref 101–111)
CO2: 26 mmol/L (ref 22–32)
CREATININE: 1.36 mg/dL — AB (ref 0.61–1.24)
GFR calc Af Amer: 58 mL/min — ABNORMAL LOW (ref >60)
GFR calc non Af Amer: 50 mL/min — ABNORMAL LOW (ref >60)
GLUCOSE: 100 mg/dL — AB (ref 65–99)
Potassium: 3.9 mmol/L (ref 3.5–5.1)
SODIUM: 138 mmol/L (ref 135–145)
Total Bilirubin: 0.9 mg/dL (ref 0.3–1.2)
Total Protein: 6.8 g/dL (ref 6.5–8.1)

## 2015-03-15 LAB — CBC
HCT: 42.8 % (ref 39.0–52.0)
Hemoglobin: 14.3 g/dL (ref 13.0–17.0)
MCH: 29.7 pg (ref 26.0–34.0)
MCHC: 33.4 g/dL (ref 30.0–36.0)
MCV: 89 fL (ref 78.0–100.0)
PLATELETS: 175 10*3/uL (ref 150–400)
RBC: 4.81 MIL/uL (ref 4.22–5.81)
RDW: 13.3 % (ref 11.5–15.5)
WBC: 6.3 10*3/uL (ref 4.0–10.5)

## 2015-03-15 LAB — LIPASE, BLOOD: LIPASE: 35 U/L (ref 11–51)

## 2015-03-15 NOTE — ED Notes (Signed)
Pt reports abdominal cramping constipation and difficult bowel movements for over 1 week. Pt states that he is now having some rectal bleeding as well.

## 2015-03-15 NOTE — ED Notes (Signed)
Patient decide to leave stated that he felt better

## 2015-08-25 ENCOUNTER — Encounter (HOSPITAL_COMMUNITY): Payer: Self-pay | Admitting: Family Medicine

## 2015-08-25 ENCOUNTER — Emergency Department (HOSPITAL_COMMUNITY)
Admission: EM | Admit: 2015-08-25 | Discharge: 2015-08-25 | Disposition: A | Discharge status: Home / Self Care | Payer: Medicare Other | Attending: Emergency Medicine | Admitting: Emergency Medicine

## 2015-08-25 DIAGNOSIS — R300 Dysuria: Secondary | ICD-10-CM | POA: Diagnosis present

## 2015-08-25 DIAGNOSIS — I2510 Atherosclerotic heart disease of native coronary artery without angina pectoris: Secondary | ICD-10-CM | POA: Diagnosis not present

## 2015-08-25 DIAGNOSIS — Z9861 Coronary angioplasty status: Secondary | ICD-10-CM | POA: Insufficient documentation

## 2015-08-25 DIAGNOSIS — Z85828 Personal history of other malignant neoplasm of skin: Secondary | ICD-10-CM | POA: Diagnosis not present

## 2015-08-25 DIAGNOSIS — Z87891 Personal history of nicotine dependence: Secondary | ICD-10-CM | POA: Insufficient documentation

## 2015-08-25 DIAGNOSIS — Z79899 Other long term (current) drug therapy: Secondary | ICD-10-CM | POA: Insufficient documentation

## 2015-08-25 DIAGNOSIS — Z7982 Long term (current) use of aspirin: Secondary | ICD-10-CM | POA: Insufficient documentation

## 2015-08-25 DIAGNOSIS — I252 Old myocardial infarction: Secondary | ICD-10-CM | POA: Insufficient documentation

## 2015-08-25 DIAGNOSIS — Z8701 Personal history of pneumonia (recurrent): Secondary | ICD-10-CM | POA: Insufficient documentation

## 2015-08-25 DIAGNOSIS — I10 Essential (primary) hypertension: Secondary | ICD-10-CM | POA: Insufficient documentation

## 2015-08-25 DIAGNOSIS — Z8639 Personal history of other endocrine, nutritional and metabolic disease: Secondary | ICD-10-CM | POA: Diagnosis not present

## 2015-08-25 DIAGNOSIS — Z8739 Personal history of other diseases of the musculoskeletal system and connective tissue: Secondary | ICD-10-CM | POA: Insufficient documentation

## 2015-08-25 DIAGNOSIS — N3091 Cystitis, unspecified with hematuria: Secondary | ICD-10-CM | POA: Diagnosis not present

## 2015-08-25 DIAGNOSIS — Z880 Allergy status to penicillin: Secondary | ICD-10-CM | POA: Diagnosis not present

## 2015-08-25 DIAGNOSIS — Z8719 Personal history of other diseases of the digestive system: Secondary | ICD-10-CM | POA: Insufficient documentation

## 2015-08-25 DIAGNOSIS — Z7952 Long term (current) use of systemic steroids: Secondary | ICD-10-CM | POA: Diagnosis not present

## 2015-08-25 LAB — URINALYSIS, ROUTINE W REFLEX MICROSCOPIC
Bilirubin Urine: NEGATIVE
Glucose, UA: NEGATIVE mg/dL
Ketones, ur: NEGATIVE mg/dL
Leukocytes, UA: NEGATIVE
Nitrite: NEGATIVE
Protein, ur: 100 mg/dL — AB
Specific Gravity, Urine: 1.017 (ref 1.005–1.030)
pH: 6 (ref 5.0–8.0)

## 2015-08-25 LAB — URINE MICROSCOPIC-ADD ON

## 2015-08-25 MED ORDER — CEPHALEXIN 500 MG PO CAPS: 500.0000 mg | ORAL_CAPSULE | Freq: Three times a day (TID) | ORAL | Status: DC

## 2015-08-25 NOTE — ED Notes (Signed)
Pt presents via POV with c/o hematuria and dysuria that began yesterday. He denies abdominal or back pain, is A&Ox4.  Denies hx of similar incidents. Dr. Laneta Simmers at bedside.

## 2015-08-25 NOTE — ED Notes (Signed)
Pt comfortable with discharge and follow up instructions. Pt declines wheelchair, escorted to waiting area.

## 2015-08-25 NOTE — Discharge Instructions (Signed)

## 2015-08-25 NOTE — ED Provider Notes (Signed)
CSN: [id number]     Arrival date & time 08/25/15  [id number] History   First MD Initiated Contact with Patient 08/25/15 [phone]     Chief Complaint  Patient presents with  . Hematuria  . Dysuria     (Consider location/radiation/quality/duration/timing/severity/associated sxs/prior Treatment) Patient is a 75 y.o. male presenting with dysuria. The history is provided by the patient.  Dysuria This is a new problem. The current episode started 12 to 24 hours ago. The problem occurs constantly. The problem has not changed since onset.Pertinent negatives include no chest pain, no abdominal pain and no shortness of breath. Associated symptoms comments: Urgency, hematuria. Nothing aggravates the symptoms. Nothing relieves the symptoms.    Past Medical History  Diagnosis Date  . CAD (coronary artery disease)     BMS RCA 8/11; LVEF 50-55%  . HLD (hyperlipidemia)   . HTN (hypertension)   . MI (myocardial infarction) ([place])     MSTEMI 8/11  . Gout   . Pneumonia 2013  . Cancer ([place])   . 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 GERD (gastroesophageal reflux disease)   . 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 Rash, skin     on lower legs   Past Surgical History  Procedure Laterality Date  . Coronary angioplasty  2010    Dr Johnsie Cancel  . Ear cyst excision Left 07/23/2012    Procedure: EXCISION LEFT EAR LOBE;  Surgeon: Melissa Montane, MD;  Location: [facility];  Service: ENT;  Laterality: Left;   No family history on file. Social History  Substance Use Topics  . Smoking status: Former Research scientist (life sciences)  . Smokeless tobacco: None     Comment: quit 9/11  . Alcohol Use: No    Review of Systems  Respiratory: Negative for shortness of breath.   Cardiovascular: Negative for chest pain.  Gastrointestinal: Negative for abdominal pain.  Genitourinary: Positive for dysuria.  All other systems reviewed and are negative.     Allergies  Penicillins  Home Medications   Prior to Admission  medications   Medication Sig Start Date End Date Taking? Authorizing Provider  aspirin EC 81 MG tablet Take 81 mg by mouth daily.    Historical Provider, MD  lisinopril (PRINIVIL,ZESTRIL) 30 MG tablet Take 30 mg by mouth daily.     Historical Provider, MD  methocarbamol (ROBAXIN) 500 MG tablet Take 1 tablet (500 mg total) by mouth 2 (two) times daily. 09/08/12   Julianne Rice, MD  nitroGLYCERIN (NITROSTAT) 0.4 MG SL tablet Place 1 tablet (0.4 mg total) under the tongue every 5 (five) minutes as needed. For chest pain 10/14/12   Josue Hector, MD  predniSONE (DELTASONE) 20 MG tablet Take 1 tablet (20 mg total) by mouth 2 (two) times daily. 09/11/12   Daleen Bo, MD  traMADol (ULTRAM) 50 MG tablet Take 1 tablet (50 mg total) by mouth every 6 (six) hours as needed for pain. 09/08/12   Julianne Rice, MD   BP 182/70 mmHg  Pulse 69  Temp(Src) 97.4 F (36.3 C) (Oral)  Resp 18  SpO2 98% Physical Exam  Constitutional: He is oriented to person, place, and time. He appears well-developed and well-nourished. No distress.  HENT:  Head: Normocephalic and atraumatic.  Eyes: Conjunctivae are normal.  Neck: Neck supple. No tracheal deviation present.  Cardiovascular: Normal rate and regular rhythm.   Pulmonary/Chest: Effort normal. No respiratory distress.  Abdominal: Soft. Normal  appearance. He exhibits no distension. There is no tenderness. There is no rigidity, no rebound and no guarding.  Neurological: He is alert and oriented to person, place, and time.  Skin: Skin is warm and dry.  Psychiatric: He has a normal mood and affect.    ED Course  Procedures (including critical care time) Labs Review Labs Reviewed  URINALYSIS, ROUTINE W REFLEX MICROSCOPIC (NOT AT [facility]) - Abnormal; Notable for the following:    Color, Urine RED (*)    APPearance CLOUDY (*)    Hgb urine dipstick LARGE (*)    Protein, ur 100 (*)    All other components within normal limits  URINE MICROSCOPIC-ADD ON - Abnormal;  Notable for the following:    Squamous Epithelial / LPF 0-5 (*)    Bacteria, UA MANY (*)    All other components within normal limits  URINE CULTURE    Imaging Review No results found. I have personally reviewed and evaluated these images and lab results as part of my medical decision-making.   EKG Interpretation None      MDM   Final diagnoses:  Hemorrhagic cystitis    74 y.o. male presents with urgency, dysuria, darkening of urine and feeling of incomplete emptying. Not anticoagulated. UA has large blood and many bacteria c/w infection which fits clinical picture, no flank pain or other clinical evidence of stone or complicating feature. Not septic appearing. Will cover empirically with keflex pending culture. Plan to follow up with PCP as needed and return precautions discussed for worsening or new concerning symptoms.     Leo Grosser, MD 08/26/15 [phone]

## 2015-08-26 LAB — URINE CULTURE: Culture: NO GROWTH

## 2015-12-17 ENCOUNTER — Emergency Department (HOSPITAL_COMMUNITY)
Admission: EM | Admit: 2015-12-17 | Discharge: 2015-12-17 | Disposition: A | Discharge status: Home / Self Care | Payer: Medicare Other | Attending: Emergency Medicine | Admitting: Emergency Medicine

## 2015-12-17 ENCOUNTER — Encounter (HOSPITAL_COMMUNITY): Payer: Self-pay | Admitting: *Deleted

## 2015-12-17 DIAGNOSIS — Z87891 Personal history of nicotine dependence: Secondary | ICD-10-CM | POA: Diagnosis not present

## 2015-12-17 DIAGNOSIS — Z7982 Long term (current) use of aspirin: Secondary | ICD-10-CM | POA: Diagnosis not present

## 2015-12-17 DIAGNOSIS — I252 Old myocardial infarction: Secondary | ICD-10-CM | POA: Insufficient documentation

## 2015-12-17 DIAGNOSIS — R319 Hematuria, unspecified: Secondary | ICD-10-CM | POA: Diagnosis not present

## 2015-12-17 DIAGNOSIS — Z955 Presence of coronary angioplasty implant and graft: Secondary | ICD-10-CM | POA: Diagnosis not present

## 2015-12-17 DIAGNOSIS — Z79899 Other long term (current) drug therapy: Secondary | ICD-10-CM | POA: Insufficient documentation

## 2015-12-17 DIAGNOSIS — I2510 Atherosclerotic heart disease of native coronary artery without angina pectoris: Secondary | ICD-10-CM | POA: Diagnosis not present

## 2015-12-17 DIAGNOSIS — I10 Essential (primary) hypertension: Secondary | ICD-10-CM | POA: Insufficient documentation

## 2015-12-17 DIAGNOSIS — N390 Urinary tract infection, site not specified: Secondary | ICD-10-CM

## 2015-12-17 DIAGNOSIS — R350 Frequency of micturition: Secondary | ICD-10-CM | POA: Diagnosis present

## 2015-12-17 DIAGNOSIS — R197 Diarrhea, unspecified: Secondary | ICD-10-CM | POA: Diagnosis not present

## 2015-12-17 LAB — URINE MICROSCOPIC-ADD ON

## 2015-12-17 LAB — URINALYSIS, ROUTINE W REFLEX MICROSCOPIC
Glucose, UA: NEGATIVE mg/dL
Ketones, ur: 15 mg/dL — AB
NITRITE: POSITIVE — AB
SPECIFIC GRAVITY, URINE: 1.025 (ref 1.005–1.030)
pH: 6 (ref 5.0–8.0)

## 2015-12-17 MED ORDER — CEPHALEXIN 500 MG PO CAPS: 500.0000 mg | ORAL_CAPSULE | Freq: Four times a day (QID) | ORAL | 0 refills | Status: DC

## 2015-12-17 MED ORDER — CEPHALEXIN 250 MG PO CAPS
500.0000 mg | ORAL_CAPSULE | Freq: Once | ORAL | Status: AC
  Administered 2015-12-17: 500 mg via ORAL
  Filled 2015-12-17: qty 2

## 2015-12-17 NOTE — ED Notes (Signed)
Patient is alert and orientedx4.  Patient was explained discharge instructions and they understood them with no questions.   

## 2015-12-17 NOTE — ED Notes (Signed)
Bladder scan [facility]

## 2015-12-17 NOTE — Discharge Instructions (Signed)
Take antibiotic as directed for the next 7 days. Contact urology for follow-up. They may be a followed up in [place]. We expect you to be improving in 2 days. Return for any new or worse symptoms.

## 2015-12-17 NOTE — ED Provider Notes (Signed)
[name] DEPT Provider Note   CSN: [id number] Arrival date & time: 12/17/15  [id number]  First Provider Contact:  First MD Initiated Contact with Patient 12/17/15 [phone]        History   Chief Complaint Chief Complaint  Patient presents with  . Urinary Frequency  . Diarrhea    HPI Angel [name] is a 75 y.o. male.  Patient presents with a complaint of dysuria and then onset of hematuria. The dysuria started yesterday. The hematuria started today. Patient states this is usually occurs when he has a urinary tract infection. Patient has a history of penicillin but he has been treated with Keflex for this in the past. Patient denies fevers nausea vomiting. Has had some loose bowel movements today 4-5. No blood in them. No significant abdominal pain no CVA tenderness.      Past Medical History:  Diagnosis Date  . CAD (coronary artery disease)    BMS RCA 8/11; LVEF 50-55%  . Cancer ([place])   . GERD (gastroesophageal reflux disease)   . Gout   . HLD (hyperlipidemia)   . HTN (hypertension)   . MI (myocardial infarction) ([place])    MSTEMI 8/11  . Pneumonia 2013  . Rash, skin    on lower legs  . 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 ABDOMINAL PAIN-GENERALIZED 02/16/2010  . ESSENTIAL HYPERTENSION, BENIGN 02/02/2010  . ELEVATED BLOOD PRESSURE 01/31/2010  . MIXED HYPERLIPIDEMIA 01/18/2010  . SMOKER 01/18/2010  . CORONARY ATHEROSCLEROSIS NATIVE CORONARY ARTERY 01/18/2010  . FATIGUE 01/18/2010    Past Surgical History:  Procedure Laterality Date  . CORONARY ANGIOPLASTY  2010   Dr Johnsie Cancel  . EAR CYST EXCISION Left 07/23/2012   Procedure: EXCISION LEFT EAR LOBE;  Surgeon: Melissa Montane, MD;  Location: [facility];  Service: ENT;  Laterality: Left;       Home Medications    Prior to Admission medications   Medication Sig Start Date End Date Taking? Authorizing  Provider  amLODipine (NORVASC) 5 MG tablet Take 5 mg by mouth daily.   Yes Historical Provider, MD  aspirin EC 81 MG tablet Take 81 mg by mouth daily.   Yes Historical Provider, MD  nitroGLYCERIN (NITROSTAT) 0.4 MG SL tablet Place 1 tablet (0.4 mg total) under the tongue every 5 (five) minutes as needed. For chest pain 10/14/12  Yes Josue Hector, MD  cephALEXin (KEFLEX) 500 MG capsule Take 1 capsule (500 mg total) by mouth 4 (four) times daily. 12/17/15   Fredia Sorrow, MD  predniSONE (DELTASONE) 20 MG tablet Take 1 tablet (20 mg total) by mouth 2 (two) times daily. Patient not taking: Reported on 12/17/2015 09/11/12   Daleen Bo, MD  traMADol (ULTRAM) 50 MG tablet Take 1 tablet (50 mg total) by mouth every 6 (six) hours as needed for pain. Patient not taking: Reported on 12/17/2015 09/08/12   Julianne Rice, MD    Family History History reviewed. No pertinent family history.  Social History Social History  Substance Use Topics  . Smoking status: Former Research scientist (life sciences)  . Smokeless tobacco: Not on file     Comment: quit 9/11  . Alcohol use No     Allergies   Penicillins   Review of Systems Review of Systems  Constitutional: Negative for fever.  HENT: Negative for congestion.   Respiratory:  Negative for shortness of breath.   Cardiovascular: Negative for chest pain.  Gastrointestinal: Positive for diarrhea. Negative for abdominal pain, nausea and vomiting.  Genitourinary: Positive for difficulty urinating, dysuria and hematuria.  Musculoskeletal: Negative for back pain.  Skin: Negative for rash.  Neurological: Negative for headaches.  Hematological: Does not bruise/bleed easily.  Psychiatric/Behavioral: Negative for confusion.     Physical Exam Updated Vital Signs BP 191/68 (BP Location: Left Arm)   Pulse 65   Temp 97.9 F (36.6 C) (Oral)   Resp 20   SpO2 97%   Physical Exam  Constitutional: He is oriented to person, place, and time. He appears well-developed and  well-nourished.  HENT:  Head: Normocephalic and atraumatic.  Eyes: EOM are normal. Pupils are equal, round, and reactive to light.  Neck: Normal range of motion.  Cardiovascular: Normal rate and regular rhythm.   Pulmonary/Chest: Effort normal and breath sounds normal.  Abdominal: Soft. Bowel sounds are normal. There is no tenderness.  Musculoskeletal: Normal range of motion.  Neurological: He is alert and oriented to person, place, and time. He displays normal reflexes. No cranial nerve deficit. He exhibits normal muscle tone.  Skin: Skin is warm. No erythema.  Nursing note and vitals reviewed.    ED Treatments / Results  Labs (all labs ordered are listed, but only abnormal results are displayed) Labs Reviewed  URINALYSIS, ROUTINE W REFLEX MICROSCOPIC (NOT AT [facility]) - Abnormal; Notable for the following:       Result Value   Color, Urine RED (*)    APPearance TURBID (*)    Hgb urine dipstick LARGE (*)    Bilirubin Urine LARGE (*)    Ketones, ur 15 (*)    Protein, ur >300 (*)    Nitrite POSITIVE (*)    Leukocytes, UA MODERATE (*)    All other components within normal limits  URINE MICROSCOPIC-ADD ON - Abnormal; Notable for the following:    Squamous Epithelial / LPF 0-5 (*)    Bacteria, UA FEW (*)    All other components within normal limits  URINE CULTURE    EKG  EKG Interpretation None       Radiology No results found.  Procedures Procedures (including critical care time)  Medications Ordered in ED Medications  cephALEXin (KEFLEX) capsule 500 mg (500 mg Oral Given 12/17/15 1210)     Initial Impression / Assessment and Plan / ED Course  I have reviewed the triage vital signs and the nursing notes.  Pertinent labs & imaging results that were available during my care of the patient were reviewed by me and considered in my medical decision making (see chart for details).  Clinical Course   Patient with onset of dysuria yesterday and hematuria today. Patient  states that normally consistent with a urinary tract infection for him. Patient stated that he was voiding frequently thought was small amounts. Bladder scan shows here that he is emptying bladder quite well. No urinary retention.  Patient started on Keflex here today he's had that in the past and has no allergy symptoms to it. Patient will be continued on Keflex. He'll follow-up with urology. Patient will return for any new or worse symptoms. Urine culture was sent and is pending.   Final Clinical Impressions(s) / ED Diagnoses   Final diagnoses:  Hematuria  UTI (lower urinary tract infection)    New Prescriptions New Prescriptions   CEPHALEXIN (KEFLEX) 500 MG CAPSULE    Take 1 capsule (500 mg total) by mouth 4 (  four) times daily.     Fredia Sorrow, MD 12/17/15 [phone]

## 2015-12-17 NOTE — ED Triage Notes (Signed)
Pt reports possible UTI, having lower abd pressure, frequency and blood in urine since last night. Also reports diarrhea today.

## 2015-12-18 LAB — URINE CULTURE

## 2016-01-23 ENCOUNTER — Other Ambulatory Visit (HOSPITAL_COMMUNITY)
Admission: RE | Admit: 2016-01-23 | Discharge: 2016-01-23 | Disposition: A | Discharge status: Home / Self Care | Payer: Medicare Other | Source: Other Acute Inpatient Hospital | Attending: Urology | Admitting: Urology

## 2016-01-23 ENCOUNTER — Ambulatory Visit (INDEPENDENT_AMBULATORY_CARE_PROVIDER_SITE_OTHER): Payer: Medicare Other | Admitting: Urology

## 2016-01-23 DIAGNOSIS — N3001 Acute cystitis with hematuria: Secondary | ICD-10-CM

## 2016-01-23 DIAGNOSIS — R3129 Other microscopic hematuria: Secondary | ICD-10-CM | POA: Diagnosis present

## 2016-01-23 DIAGNOSIS — R310 Gross hematuria: Secondary | ICD-10-CM | POA: Diagnosis not present

## 2016-01-23 DIAGNOSIS — R972 Elevated prostate specific antigen [PSA]: Secondary | ICD-10-CM | POA: Diagnosis not present

## 2016-01-23 LAB — URINALYSIS, ROUTINE W REFLEX MICROSCOPIC
Bilirubin Urine: NEGATIVE
GLUCOSE, UA: NEGATIVE mg/dL
Ketones, ur: NEGATIVE mg/dL
Leukocytes, UA: NEGATIVE
Nitrite: NEGATIVE
Protein, ur: NEGATIVE mg/dL
SPECIFIC GRAVITY, URINE: 1.02 (ref 1.005–1.030)
pH: 6 (ref 5.0–8.0)

## 2016-01-23 LAB — URINE MICROSCOPIC-ADD ON
SQUAMOUS EPITHELIAL / LPF: NONE SEEN
WBC, UA: NONE SEEN WBC/hpf (ref 0–5)

## 2016-02-01 ENCOUNTER — Other Ambulatory Visit: Payer: Self-pay | Admitting: Urology

## 2016-02-01 DIAGNOSIS — R310 Gross hematuria: Secondary | ICD-10-CM

## 2016-02-07 ENCOUNTER — Other Ambulatory Visit (HOSPITAL_COMMUNITY)
Admission: RE | Admit: 2016-02-07 | Discharge: 2016-02-07 | Disposition: A | Discharge status: Home / Self Care | Payer: Medicare Other | Source: Ambulatory Visit | Attending: Urology | Admitting: Urology

## 2016-02-07 ENCOUNTER — Ambulatory Visit (INDEPENDENT_AMBULATORY_CARE_PROVIDER_SITE_OTHER): Payer: Medicare Other | Admitting: Urology

## 2016-02-07 DIAGNOSIS — R310 Gross hematuria: Secondary | ICD-10-CM | POA: Diagnosis not present

## 2016-02-07 DIAGNOSIS — R319 Hematuria, unspecified: Secondary | ICD-10-CM | POA: Diagnosis present

## 2016-02-07 DIAGNOSIS — R3915 Urgency of urination: Secondary | ICD-10-CM

## 2016-02-07 DIAGNOSIS — N400 Enlarged prostate without lower urinary tract symptoms: Secondary | ICD-10-CM

## 2016-02-09 LAB — URINE CULTURE: CULTURE: NO GROWTH

## 2016-02-19 ENCOUNTER — Ambulatory Visit (HOSPITAL_COMMUNITY): Payer: Medicare Other

## 2016-02-27 ENCOUNTER — Ambulatory Visit (INDEPENDENT_AMBULATORY_CARE_PROVIDER_SITE_OTHER): Payer: Medicare Other | Admitting: Urology

## 2016-02-27 DIAGNOSIS — N210 Calculus in bladder: Secondary | ICD-10-CM | POA: Diagnosis not present

## 2016-02-27 DIAGNOSIS — R310 Gross hematuria: Secondary | ICD-10-CM

## 2016-03-01 ENCOUNTER — Other Ambulatory Visit: Payer: Self-pay | Admitting: Urology

## 2016-03-01 DIAGNOSIS — R310 Gross hematuria: Secondary | ICD-10-CM

## 2016-03-08 ENCOUNTER — Ambulatory Visit: Admission: RE | Admit: 2016-03-08 | Payer: Medicare Other | Source: Ambulatory Visit

## 2016-03-08 ENCOUNTER — Ambulatory Visit (HOSPITAL_COMMUNITY)
Admission: RE | Admit: 2016-03-08 | Discharge: 2016-03-08 | Disposition: A | Discharge status: Home / Self Care | Payer: Medicare Other | Source: Ambulatory Visit | Attending: Urology | Admitting: Urology

## 2016-03-08 DIAGNOSIS — N210 Calculus in bladder: Secondary | ICD-10-CM | POA: Diagnosis not present

## 2016-03-08 DIAGNOSIS — R310 Gross hematuria: Secondary | ICD-10-CM | POA: Diagnosis not present

## 2016-03-08 DIAGNOSIS — N2889 Other specified disorders of kidney and ureter: Secondary | ICD-10-CM | POA: Insufficient documentation

## 2016-03-08 DIAGNOSIS — R918 Other nonspecific abnormal finding of lung field: Secondary | ICD-10-CM | POA: Insufficient documentation

## 2016-03-08 DIAGNOSIS — K8020 Calculus of gallbladder without cholecystitis without obstruction: Secondary | ICD-10-CM | POA: Insufficient documentation

## 2016-03-08 MED ORDER — IOPAMIDOL (ISOVUE-300) INJECTION 61%
150.0000 mL | Freq: Once | INTRAVENOUS | Status: AC | PRN
  Administered 2016-03-08: 125 mL via INTRAVENOUS

## 2016-03-14 ENCOUNTER — Other Ambulatory Visit: Payer: Self-pay | Admitting: Urology

## 2016-03-14 DIAGNOSIS — R918 Other nonspecific abnormal finding of lung field: Secondary | ICD-10-CM

## 2016-03-22 ENCOUNTER — Ambulatory Visit (HOSPITAL_COMMUNITY)
Admission: RE | Admit: 2016-03-22 | Discharge: 2016-03-22 | Disposition: A | Discharge status: Home / Self Care | Payer: Medicare Other | Source: Ambulatory Visit | Attending: Urology | Admitting: Urology

## 2016-03-22 DIAGNOSIS — N2889 Other specified disorders of kidney and ureter: Secondary | ICD-10-CM | POA: Insufficient documentation

## 2016-03-22 DIAGNOSIS — R918 Other nonspecific abnormal finding of lung field: Secondary | ICD-10-CM

## 2016-03-22 DIAGNOSIS — I700 Atherosclerosis of aorta: Secondary | ICD-10-CM | POA: Insufficient documentation

## 2016-03-22 DIAGNOSIS — K8020 Calculus of gallbladder without cholecystitis without obstruction: Secondary | ICD-10-CM | POA: Insufficient documentation

## 2016-05-08 ENCOUNTER — Ambulatory Visit (INDEPENDENT_AMBULATORY_CARE_PROVIDER_SITE_OTHER): Payer: Medicare Other | Admitting: Urology

## 2016-05-08 DIAGNOSIS — R310 Gross hematuria: Secondary | ICD-10-CM

## 2016-05-14 ENCOUNTER — Other Ambulatory Visit: Payer: Self-pay | Admitting: Urology

## 2016-05-14 DIAGNOSIS — R911 Solitary pulmonary nodule: Secondary | ICD-10-CM

## 2016-05-14 DIAGNOSIS — D381 Neoplasm of uncertain behavior of trachea, bronchus and lung: Secondary | ICD-10-CM

## 2016-05-22 ENCOUNTER — Ambulatory Visit (INDEPENDENT_AMBULATORY_CARE_PROVIDER_SITE_OTHER): Payer: Medicare Other | Admitting: Urology

## 2016-05-22 DIAGNOSIS — N401 Enlarged prostate with lower urinary tract symptoms: Secondary | ICD-10-CM

## 2016-05-22 DIAGNOSIS — D49511 Neoplasm of unspecified behavior of right kidney: Secondary | ICD-10-CM | POA: Diagnosis not present

## 2016-10-10 DIAGNOSIS — D126 Benign neoplasm of colon, unspecified: Secondary | ICD-10-CM | POA: Insufficient documentation

## 2016-10-10 DIAGNOSIS — K5730 Diverticulosis of large intestine without perforation or abscess without bleeding: Secondary | ICD-10-CM | POA: Insufficient documentation

## 2016-10-29 ENCOUNTER — Other Ambulatory Visit: Payer: Self-pay | Admitting: Urology

## 2016-10-29 DIAGNOSIS — D381 Neoplasm of uncertain behavior of trachea, bronchus and lung: Secondary | ICD-10-CM

## 2016-10-29 DIAGNOSIS — D3000 Benign neoplasm of unspecified kidney: Secondary | ICD-10-CM

## 2016-10-29 DIAGNOSIS — R911 Solitary pulmonary nodule: Secondary | ICD-10-CM

## 2016-11-01 ENCOUNTER — Ambulatory Visit (HOSPITAL_COMMUNITY)
Admission: RE | Admit: 2016-11-01 | Discharge: 2016-11-01 | Disposition: A | Discharge status: Home / Self Care | Payer: Medicare Other | Source: Ambulatory Visit | Attending: Urology | Admitting: Urology

## 2016-11-01 DIAGNOSIS — N210 Calculus in bladder: Secondary | ICD-10-CM | POA: Diagnosis not present

## 2016-11-01 DIAGNOSIS — R918 Other nonspecific abnormal finding of lung field: Secondary | ICD-10-CM | POA: Insufficient documentation

## 2016-11-01 DIAGNOSIS — I700 Atherosclerosis of aorta: Secondary | ICD-10-CM | POA: Insufficient documentation

## 2016-11-01 DIAGNOSIS — K8020 Calculus of gallbladder without cholecystitis without obstruction: Secondary | ICD-10-CM | POA: Insufficient documentation

## 2016-11-01 DIAGNOSIS — D3000 Benign neoplasm of unspecified kidney: Secondary | ICD-10-CM | POA: Diagnosis present

## 2016-11-01 DIAGNOSIS — N2889 Other specified disorders of kidney and ureter: Secondary | ICD-10-CM | POA: Insufficient documentation

## 2016-11-01 MED ORDER — IOPAMIDOL (ISOVUE-300) INJECTION 61%
125.0000 mL | Freq: Once | INTRAVENOUS | Status: AC | PRN
  Administered 2016-11-01: 125 mL via INTRAVENOUS

## 2016-11-20 ENCOUNTER — Other Ambulatory Visit: Payer: Self-pay | Admitting: Urology

## 2016-11-20 ENCOUNTER — Ambulatory Visit (INDEPENDENT_AMBULATORY_CARE_PROVIDER_SITE_OTHER): Payer: Medicare Other | Admitting: Urology

## 2016-11-20 DIAGNOSIS — D49511 Neoplasm of unspecified behavior of right kidney: Secondary | ICD-10-CM | POA: Diagnosis not present

## 2016-11-20 DIAGNOSIS — N401 Enlarged prostate with lower urinary tract symptoms: Secondary | ICD-10-CM | POA: Diagnosis not present

## 2016-11-20 DIAGNOSIS — N2889 Other specified disorders of kidney and ureter: Secondary | ICD-10-CM

## 2016-11-28 ENCOUNTER — Ambulatory Visit
Admission: RE | Admit: 2016-11-28 | Discharge: 2016-11-28 | Disposition: A | Discharge status: Home / Self Care | Payer: Medicare Other | Source: Ambulatory Visit | Attending: Urology | Admitting: Urology

## 2016-11-28 DIAGNOSIS — N2889 Other specified disorders of kidney and ureter: Secondary | ICD-10-CM

## 2016-11-28 HISTORY — PX: IR RADIOLOGIST EVAL & MGMT: IMG5224

## 2016-11-28 NOTE — Consult Note (Signed)
Chief Complaint: Enlarging right solid renal mass compatible with renal cell carcinoma.   Referring Physician(s): McKenzie,Patrick L  History of Present Illness: Angel [name] is a 76 y.o. male with a known solid enhancing right renal mass by CT imaging from October 2017. Surveillance imaging from 11/01/2016 confirms enlargement of the right renal cell carcinoma now measuring 3.5 cm. He remains a symptomatic. No current flank or abdominal pain. No hematuria. He does have a history of prostate enlargement and bladder calculi. Overall he is fairly healthy. He does have a prior history of coronary disease status post coronary stent remotely. He is retired but is fairly active doing yard work. He is here today discuss cryoablation.  Past Medical History:  Diagnosis Date  . CAD (coronary artery disease)    BMS RCA 8/11; LVEF 50-55%  . Cancer ([place])   . GERD (gastroesophageal reflux disease)   . Gout   . HLD (hyperlipidemia)   . HTN (hypertension)   . MI (myocardial infarction)    MSTEMI 8/11  . Pneumonia 2013  . Rash, skin    on lower legs  . 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    Past Surgical History:  Procedure Laterality Date  . CORONARY ANGIOPLASTY  2010   Dr Johnsie Cancel  . EAR CYST EXCISION Left 07/23/2012   Procedure: EXCISION LEFT EAR LOBE;  Surgeon: Melissa Montane, MD;  Location: [facility];  Service: ENT;  Laterality: Left;    Allergies: Crestor [rosuvastatin]; Penicillins; and Tramadol  Medications: Prior to Admission medications   Medication Sig Start Date End Date Taking? Authorizing Provider  amLODipine (NORVASC) 5 MG tablet Take 5 mg by mouth daily.   Yes [provider]  aspirin EC 81 MG tablet Take 81 mg by mouth daily.    [provider]  cephALEXin (KEFLEX) 500 MG capsule Take 1 capsule (500 mg total) by mouth 4 (four) times daily. Patient not taking: Reported on 11/28/2016  12/17/15   Fredia Sorrow, MD  nitroGLYCERIN (NITROSTAT) 0.4 MG SL tablet Place 1 tablet (0.4 mg total) under the tongue every 5 (five) minutes as needed. For chest pain Patient not taking: Reported on 11/28/2016 10/14/12   Josue Hector, MD  predniSONE (DELTASONE) 20 MG tablet Take 1 tablet (20 mg total) by mouth 2 (two) times daily. Patient not taking: Reported on 12/17/2015 09/11/12   Daleen Bo, MD  traMADol (ULTRAM) 50 MG tablet Take 1 tablet (50 mg total) by mouth every 6 (six) hours as needed for pain. Patient not taking: Reported on 12/17/2015 09/08/12   Julianne Rice, MD     No family history on file.  Social History   Social History  . Marital status: Married    Spouse name: N/A  . Number of children: N/A  . Years of education: N/A   Social History Main Topics  . Smoking status: Former Research scientist (life sciences)  . Smokeless tobacco: Not on file     Comment: quit 9/11  . Alcohol use No  . Drug use: No  . Sexual activity: Not on file   Other Topics Concern  . Not on file   Social History Narrative   Married; full time; does not get regular exercise.     ECOG Status: 0 - Asymptomatic  Review of Systems: A 12 point ROS discussed and pertinent positives are indicated in the HPI  above.  All other systems are negative.  Review of Systems  Vital Signs: BP (!) 154/72   Pulse 68   Temp 98 F (36.7 C) (Oral)   Resp 15   Ht 5\' 10"  (1.778 m)   Wt 167 lb (75.8 kg)   SpO2 96%   BMI 23.96 kg/m   Physical Exam  Constitutional: He is oriented to person, place, and time. He appears well-developed and well-nourished. No distress.  Eyes: Conjunctivae are normal. No scleral icterus.  Cardiovascular: Normal rate and regular rhythm.   No murmur heard. Pulmonary/Chest: Effort normal and breath sounds normal. No respiratory distress.  Abdominal: Soft. Bowel sounds are normal. He exhibits no distension and no mass. There is no tenderness.  Musculoskeletal: Normal range of motion. He exhibits  no edema or tenderness.  Right lower extremity varicosities noted compatible with venous insufficiency  Neurological: He is alert and oriented to person, place, and time.  Skin: He is not diaphoretic.  Psychiatric: He has a normal mood and affect. His behavior is normal.    Mallampati Score:   2  Imaging: Ct Abdomen Pelvis W Wo Contrast  Result Date: 11/01/2016 CLINICAL DATA:  Follow up benign renal neoplasm. Hematuria, frequency and urgency. History of squamous cell carcinoma of the skin. EXAM: CT ABDOMEN AND PELVIS WITHOUT AND WITH CONTRAST TECHNIQUE: Multidetector CT imaging of the abdomen and pelvis was performed following the standard protocol before and following the bolus administration of intravenous contrast. CONTRAST:  18mL ISOVUE-300 IOPAMIDOL (ISOVUE-300) INJECTION 61% COMPARISON:  CT 03/08/2016. FINDINGS: Lower chest: Multiple pulmonary nodules are again noted at both lung bases, not significantly changed from the previous study. There is no significant pleural or pericardial effusion. Aortic and coronary artery calcifications are present. Hepatobiliary: Multiple small low-density hepatic lesions are stable, likely all cysts. Some of these remain too small to characterize, but no new or enlarging lesions are identified. Multiple gallstones are again seen. There is no gallbladder wall thickening, surrounding inflammation or biliary dilatation. Pancreas: Unremarkable. No pancreatic ductal dilatation or surrounding inflammatory changes. Spleen: Normal in size without suspicious finding. There is mild peripheral heterogeneity on the early postcontrast images, likely vascular phenomena. Adrenals/Urinary Tract: Stable mild nodularity of the left adrenal gland. The right adrenal gland appears normal. Progressive enlargement of previously demonstrated enhancing mass involving the lower pole of the right kidney. This measures 3.5 x 3.1 x 3.5 cm (previously 2.9 x 2.9 x 3.4 cm). It demonstrates  heterogeneous enhancement, increasing from 49 HU on the precontrast images to 142 HU on the immediate postcontrast images and 94 HU on the delayed images. Probable small cyst in the upper pole the left kidney. No other renal masses. There is no hydronephrosis. There is mild bladder wall thickening without apparent focal abnormality. Numerous bladder calculi are again noted. Stomach/Bowel: No evidence of bowel wall thickening, distention or surrounding inflammatory change. Stable sigmoid colon diverticular changes. Vascular/Lymphatic: There are no enlarged abdominal or pelvic lymph nodes. Diffuse aortic and branch vessel atherosclerosis with associated irregular mural thrombus in the aortic lumen. The distal aorta remains mildly dilated to 2.8 cm AP, stable. The IVC eighth renal and iliac veins appear normal. However, there are new filling defects within a dilated right saphenous vein in the proximal thigh, consistent with superficial venous thrombosis. Reproductive: Stable moderate heterogeneous enlargement of the prostate gland consistent with BPH. Other: No evidence of abdominal wall mass or hernia. No ascites. Musculoskeletal: No acute or significant osseous findings. Scattered bone islands are stable. IMPRESSION: 1.  Enlarging heterogeneously enhancing mass involving the lower pole of the right kidney, remaining worrisome for renal cell carcinoma. Unless the patient has a definitive benign diagnosis, repeat tissue sampling may be warranted. 2. No evidence of abdominal metastatic disease. 3. Small nodules at both lung bases are stable from previous examination of 8 months ago, favoring a benign etiology. 4. Filling defects in a dilated right saphenous vein suspicious for superficial venous thrombosis/thrombophlebitis. 5.  Aortic Atherosclerosis (ICD10-I70.0). 6. Cholelithiasis and numerous bladder calculi again noted. 7. These results will be called to the ordering clinician or representative by the Radiologist  Assistant, and communication documented in the PACS or zVision Dashboard. Electronically Signed   By: Richardean Sale M.D.   On: 11/01/2016 13:31    Labs:  CBC: No results for input(s): WBC, HGB, HCT, PLT in the last 8760 hours.  COAGS: No results for input(s): INR, APTT in the last 8760 hours.  BMP: No results for input(s): NA, K, CL, CO2, GLUCOSE, BUN, CALCIUM, CREATININE, GFRNONAA, GFRAA in the last 8760 hours.  Invalid input(s): CMP  LIVER FUNCTION TESTS: No results for input(s): BILITOT, AST, ALT, ALKPHOS, PROT, ALBUMIN in the last 8760 hours.  TUMOR MARKERS: No results for input(s): AFPTM, CEA, CA199, CHROMGRNA in the last 8760 hours.  Assessment and Plan:  Enlarging now 3.5 cm right renal mass compatible with a renal cell carcinoma. Lesion location and size remains amenable to CT-guided cryoablation under general anesthesia. The procedure, risks, benefits and alternatives were reviewed. He also understands the expected goals and outcomes as well as continue surveillance. All questions were addressed. He has a significant coronary history. He will need cardiac clearance prior to the procedure.  Plan: Pending cardiac clearance, schedule for CT-guided cryoablation with general anesthesia [facility] as an outpatient electively.  Thank you for this interesting consult.  I greatly enjoyed meeting Angel [name] and look forward to participating in their care.  A copy of this report was sent to the requesting provider on this date.  Electronically Signed: Greggory Keen 11/28/2016, 2:02 PM   I spent a total of  40 Minutes   in face to face in clinical consultation, greater than 50% of which was counseling/coordinating care for this patient with a right renal cell carcinoma.

## 2016-12-05 ENCOUNTER — Ambulatory Visit: Payer: Medicare Other | Admitting: Cardiology

## 2016-12-16 ENCOUNTER — Other Ambulatory Visit (HOSPITAL_COMMUNITY): Payer: Self-pay | Admitting: Interventional Radiology

## 2016-12-16 DIAGNOSIS — N2889 Other specified disorders of kidney and ureter: Secondary | ICD-10-CM

## 2017-01-01 ENCOUNTER — Ambulatory Visit: Payer: Medicare Other | Admitting: Urology

## 2017-01-03 ENCOUNTER — Telehealth: Payer: Self-pay | Admitting: Cardiology

## 2017-01-03 ENCOUNTER — Ambulatory Visit (INDEPENDENT_AMBULATORY_CARE_PROVIDER_SITE_OTHER): Payer: Medicare Other | Admitting: Cardiology

## 2017-01-03 ENCOUNTER — Encounter: Payer: Self-pay | Admitting: Cardiology

## 2017-01-03 VITALS — BP 138/56 | HR 65 | Ht 70.0 in | Wt 171.0 lb

## 2017-01-03 DIAGNOSIS — R600 Localized edema: Secondary | ICD-10-CM

## 2017-01-03 DIAGNOSIS — Z01810 Encounter for preprocedural cardiovascular examination: Secondary | ICD-10-CM | POA: Diagnosis not present

## 2017-01-03 DIAGNOSIS — R011 Cardiac murmur, unspecified: Secondary | ICD-10-CM

## 2017-01-03 DIAGNOSIS — I2510 Atherosclerotic heart disease of native coronary artery without angina pectoris: Secondary | ICD-10-CM | POA: Diagnosis not present

## 2017-01-03 NOTE — Telephone Encounter (Signed)
Pre-cert Verification for the following procedure   Echo set for 01-16-17.

## 2017-01-03 NOTE — Progress Notes (Addendum)
Clinical Summary Angel [name] is a 76 y.o.male seen as new patient, referred by [facility] for preoperative cardiac evaluation.   1. Preoperative cardiac evaluation - patient with kidney mass. 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    2. CAD - history of prior stent  in 9/ 2011 to RCA - no recent chest pain, no SOB/DOE - mild LE edema which is chronic - highest level of activity is yardwork which he tolerates - reports can walk up to 2 flights of stairs without troubles, can walk several blocks without symptoms - compliant with meds  - he is off aspirin due hematuria 6 months ago. UTI at the time.  - crestor listed as allergy, he does not recall the specifics.    3. DVT - completed eliquis course, followed by pcp - had some bloody stools on eliquis.  Past Medical History:  Diagnosis Date  . CAD (coronary artery disease)    BMS RCA 8/11; LVEF 50-55%  . Cancer ([place])   . GERD (gastroesophageal reflux disease)   . Gout   . HLD (hyperlipidemia)   . HTN (hypertension)   . MI (myocardial infarction)    MSTEMI 8/11  . Pneumonia 2013  . Rash, skin    on lower legs  . 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 Crestor [Rosuvastatin]   . Penicillins Other (See Comments)    Per patient causes gi upset with high doses. He says he does OK with shots but gets GI upset with PO penicillins  . Tramadol Other (See Comments)    Confusion     Current Outpatient Prescriptions  Medication Sig Dispense Refill  . amLODipine (NORVASC) 5 MG tablet Take 5 mg by mouth daily.    Marland Kitchen aspirin EC 81 MG tablet Take 81 mg by mouth daily.    . cephALEXin (KEFLEX) 500 MG capsule Take 1 capsule (500 mg total) by mouth 4 (four) times daily. (Patient not taking: Reported on 11/28/2016) 28 capsule 0  . nitroGLYCERIN (NITROSTAT)  0.4 MG SL tablet Place 1 tablet (0.4 mg total) under the tongue every 5 (five) minutes as needed. For chest pain (Patient not taking: Reported on 11/28/2016) 20 tablet 0  . predniSONE (DELTASONE) 20 MG tablet Take 1 tablet (20 mg total) by mouth 2 (two) times daily. (Patient not taking: Reported on 12/17/2015) 10 tablet 0  . traMADol (ULTRAM) 50 MG tablet Take 1 tablet (50 mg total) by mouth every 6 (six) hours as needed for pain. (Patient not taking: Reported on 12/17/2015) 15 tablet 0   No current facility-administered medications for this visit.      Past Surgical History:  Procedure Laterality Date  . CORONARY ANGIOPLASTY  2010   Dr Johnsie Cancel  . EAR CYST EXCISION Left 07/23/2012   Procedure: EXCISION LEFT EAR LOBE;  Surgeon: Melissa Montane, MD;  Location: [place];  Service: ENT;  Laterality: Left;     Allergies  Allergen Reactions  . Crestor [Rosuvastatin]   . Penicillins Other (See Comments)    Per patient causes gi upset with high doses. He says he does OK with shots but gets GI upset with PO penicillins  . Tramadol Other (See Comments)    Confusion      Family history He denies any history  of CAD in his parents or siblings.    Social History Mr. Cast reports that he has quit smoking. He does not have any smokeless tobacco history on file. Mr. Ohalloran reports that he does not drink alcohol.   Review of Systems CONSTITUTIONAL: No weight loss, fever, chills, weakness or fatigue.  HEENT: Eyes: No visual loss, blurred vision, double vision or yellow sclerae.No hearing loss, sneezing, congestion, runny nose or sore throat.  SKIN: No rash or itching.  CARDIOVASCULAR: pe rhpi RESPIRATORY: No shortness of breath, cough or sputum.  GASTROINTESTINAL: No anorexia, nausea, vomiting or diarrhea. No abdominal pain or blood.  GENITOURINARY: No burning on urination, no polyuria NEUROLOGICAL: No headache, dizziness, syncope, paralysis, ataxia, numbness or tingling in the extremities. No change in  bowel or bladder control.  MUSCULOSKELETAL: No muscle, back pain, joint pain or stiffness.  LYMPHATICS: No enlarged nodes. No history of splenectomy.  PSYCHIATRIC: No history of depression or anxiety.  ENDOCRINOLOGIC: No reports of sweating, cold or heat intolerance. No polyuria or polydipsia.  Marland Kitchen   Physical Examination Vitals:   01/03/17 1258  BP: (!) 138/56  Pulse: 65  SpO2: 97%   Vitals:   01/03/17 1258  Weight: 171 lb (77.6 kg)  Height: 5\' 10"  (1.778 m)    Gen: resting comfortably, no acute distress HEENT: no scleral icterus, pupils equal round and reactive, no palptable cervical adenopathy,  CV: RRR, 2/6 systolic murmur at apex, no jvd Resp: Clear to auscultation bilaterally GI: abdomen is soft, non-tender, non-distended, normal bowel sounds, no hepatosplenomegaly MSK: extremities are warm, no edema.  Skin: warm, no rash Neuro:  no focal deficits Psych: appropriate affect     Assessment and Plan  1. Preoperative cardiac evaluation - patient being considered for cryoablation of renal mass under general anesthesia - he is able to tolerate well over 4 METs regularly without symptoms - given his CAD history and intermittent LE edema, would like to obtain echo prior to surgery. Pending results would anticipate proceeding with surgery  2. CAD - will need to restart ASA after upcoming surgery - will need to look into his statin history further - baselien EKG today shows SR, no ischemic changes  3. Heart murmur  - obtain echo  4. LE edema - obtain echo  F/u 6 weeks. Clearance pending echo results   Arnoldo Lenis, M.D.  01/27/17 echo Echo with normal LVEF 60-65%, grade I diastoilc dysfunction. Recommend proceeding with surgery as planned  Carlyle Dolly MD

## 2017-01-03 NOTE — Patient Instructions (Signed)
Your physician recommends that you schedule a follow-up appointment in: 2 MONTHS WITH DR BRANCH  Your physician recommends that you continue on your current medications as directed. Please refer to the Current Medication list given to you today.  Your physician has requested that you have an echocardiogram. Echocardiography is a painless test that uses sound waves to create images of your heart. It provides your doctor with information about the size and shape of your heart and how well your heart's chambers and valves are working. This procedure takes approximately one hour. There are no restrictions for this procedure.  Thank you for choosing [facility]!!     

## 2017-01-16 ENCOUNTER — Ambulatory Visit (INDEPENDENT_AMBULATORY_CARE_PROVIDER_SITE_OTHER): Payer: Medicare Other

## 2017-01-16 ENCOUNTER — Other Ambulatory Visit: Payer: Self-pay

## 2017-01-16 DIAGNOSIS — I2510 Atherosclerotic heart disease of native coronary artery without angina pectoris: Secondary | ICD-10-CM

## 2017-01-18 ENCOUNTER — Emergency Department (HOSPITAL_COMMUNITY)
Admission: EM | Admit: 2017-01-18 | Discharge: 2017-01-18 | Disposition: A | Discharge status: Home Health | Payer: Medicare Other | Attending: Emergency Medicine | Admitting: Emergency Medicine

## 2017-01-18 ENCOUNTER — Emergency Department (HOSPITAL_COMMUNITY): Payer: Medicare Other

## 2017-01-18 ENCOUNTER — Encounter (HOSPITAL_COMMUNITY): Payer: Self-pay

## 2017-01-18 DIAGNOSIS — Z79899 Other long term (current) drug therapy: Secondary | ICD-10-CM | POA: Diagnosis not present

## 2017-01-18 DIAGNOSIS — Z87891 Personal history of nicotine dependence: Secondary | ICD-10-CM | POA: Insufficient documentation

## 2017-01-18 DIAGNOSIS — M2550 Pain in unspecified joint: Secondary | ICD-10-CM | POA: Diagnosis not present

## 2017-01-18 DIAGNOSIS — M7021 Olecranon bursitis, right elbow: Secondary | ICD-10-CM | POA: Diagnosis not present

## 2017-01-18 DIAGNOSIS — I2510 Atherosclerotic heart disease of native coronary artery without angina pectoris: Secondary | ICD-10-CM | POA: Insufficient documentation

## 2017-01-18 DIAGNOSIS — Y9389 Activity, other specified: Secondary | ICD-10-CM | POA: Insufficient documentation

## 2017-01-18 DIAGNOSIS — M25521 Pain in right elbow: Secondary | ICD-10-CM | POA: Diagnosis present

## 2017-01-18 DIAGNOSIS — I10 Essential (primary) hypertension: Secondary | ICD-10-CM | POA: Diagnosis not present

## 2017-01-18 HISTORY — DX: Acute embolism and thrombosis of unspecified deep veins of unspecified lower extremity: I82.409

## 2017-01-18 HISTORY — DX: Calculus of gallbladder without cholecystitis without obstruction: K80.20

## 2017-01-18 HISTORY — DX: Disorder of kidney and ureter, unspecified: N28.9

## 2017-01-18 HISTORY — DX: Calculus in bladder: N21.0

## 2017-01-18 MED ORDER — MELOXICAM 15 MG PO TABS: 15.0000 mg | ORAL_TABLET | Freq: Every day | ORAL | 0 refills | Status: DC

## 2017-01-18 MED ORDER — CEPHALEXIN 500 MG PO CAPS: 500.0000 mg | ORAL_CAPSULE | Freq: Three times a day (TID) | ORAL | 0 refills | Status: DC

## 2017-01-18 MED ORDER — IBUPROFEN 400 MG PO TABS
400.0000 mg | ORAL_TABLET | Freq: Once | ORAL | Status: AC
  Administered 2017-01-18: 400 mg via ORAL
  Filled 2017-01-18: qty 1

## 2017-01-18 MED ORDER — DEXAMETHASONE SODIUM PHOSPHATE 10 MG/ML IJ SOLN
10.0000 mg | Freq: Once | INTRAMUSCULAR | Status: DC
  Filled 2017-01-18: qty 1

## 2017-01-18 NOTE — ED Triage Notes (Signed)
Pt reports that his right elbow started swelling on Thursday. Reports he did not injure the elbow, but has a scabbed over area that he has scratched

## 2017-01-18 NOTE — Discharge Instructions (Signed)
Your examination suggest inflammation of the olecranon bursa of your elbow. Please use the Ace wrap over the next 5 days. Please use meloxicam one time daily with a meal use Tylenol Extra Strength every 4 hours as needed for pain. Please use Keflex 3 times daily with food to prevent any infection. Please see Dr. Aline Brochure for additional evaluation if not improving.

## 2017-01-18 NOTE — ED Provider Notes (Signed)
**Note Angel-Identified via Obfuscation** [facility] DEPT Provider Note   CSN: [id number] Arrival date & time: 01/18/17  1141     History   Chief Complaint Chief Complaint  Patient presents with  . Elbow Pain    HPI Angel [name] is a 76 y.o. male.  Pt is a 76 y/o male who presents to the ED with swelling and pain of the right elbow. He reports a few minor scraps on the right arm, but no trauma directly to the elbow. No recent injury or trauma. He states he has to [place] the right arm to push up from the chair, and rest his arm of the chair and arm rest a lot. No fever or chills. The elbow has felt warm in the past. He has not taken any medications for this.   The history is provided by the patient.    Past Medical History:  Diagnosis Date  . Bladder stones   . CAD (coronary artery disease)    BMS RCA 8/11; LVEF 50-55%  . Cancer ([place])   . Deep vein thrombosis (DVT) ([place])   . Gall bladder stones   . GERD (gastroesophageal reflux disease)   . Gout   . HLD (hyperlipidemia)   . HTN (hypertension)   . MI (myocardial infarction) ([place])    MSTEMI 8/11  . Pneumonia 2013  . Rash, skin    on lower legs  . Renal disorder    kidney growth  . 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 ABDOMINAL PAIN-GENERALIZED 02/16/2010  . ESSENTIAL HYPERTENSION, BENIGN 02/02/2010  . ELEVATED BLOOD PRESSURE 01/31/2010  . MIXED HYPERLIPIDEMIA 01/18/2010  . SMOKER 01/18/2010  . CORONARY ATHEROSCLEROSIS NATIVE CORONARY ARTERY 01/18/2010  . FATIGUE 01/18/2010    Past Surgical History:  Procedure Laterality Date  . COLONOSCOPY    . COLONOSCOPY    . CORONARY ANGIOPLASTY  2010   Dr Johnsie Cancel  . DOPPLER ECHOCARDIOGRAPHY    . EAR CYST EXCISION Left 07/23/2012   Procedure: EXCISION LEFT EAR LOBE;  Surgeon: Melissa Montane, MD;  Location: [facility];  Service: ENT;  Laterality: Left;       Home Medications    Prior to  Admission medications   Medication Sig Start Date End Date Taking? Authorizing Provider  amLODipine (NORVASC) 5 MG tablet Take 5 mg by mouth daily.   Yes [provider]    Family History No family history on file.  Social History Social History  Substance Use Topics  . Smoking status: Former Smoker    Quit date: 05/14/1999  . Smokeless tobacco: Never Used     Comment: quit 9/11  . Alcohol use No     Allergies   Crestor [rosuvastatin]; Penicillins; and Tramadol   Review of Systems Review of Systems  Constitutional: Negative for activity change.       All ROS Neg except as noted in HPI  HENT: Negative for nosebleeds.   Eyes: Negative for photophobia and discharge.  Respiratory: Negative for cough, shortness of breath and wheezing.   Cardiovascular: Negative for chest pain and palpitations.  Gastrointestinal: Negative for abdominal pain and blood in stool.  Genitourinary: Negative for dysuria, frequency and hematuria.  Musculoskeletal: Positive for arthralgias. Negative for back pain and neck pain.  Skin: Negative.   Neurological: Negative for dizziness, seizures and speech difficulty.  Psychiatric/Behavioral: Negative  for confusion and hallucinations.     Physical Exam Updated Vital Signs BP (!) 147/53 (BP Location: Right Arm)   Pulse 68   Temp 97.7 F (36.5 C) (Oral)   Resp 18   Wt 77.6 kg (171 lb)   SpO2 95%   BMI 24.54 kg/m   Physical Exam  Constitutional: He is oriented to person, place, and time. He appears well-developed and well-nourished.  Non-toxic appearance.  HENT:  Head: Normocephalic.  Right Ear: Tympanic membrane and external ear normal.  Left Ear: Tympanic membrane and external ear normal.  Eyes: Pupils are equal, round, and reactive to light. EOM and lids are normal.  Neck: Normal range of motion. Neck supple. Carotid bruit is not present.  Cardiovascular: Normal rate, regular rhythm, normal heart sounds, intact distal pulses and  normal pulses.   Pulmonary/Chest: Breath sounds normal. No respiratory distress.  Abdominal: Soft. Bowel sounds are normal. There is no tenderness. There is no guarding.  Musculoskeletal: Normal range of motion. He exhibits tenderness.       Right elbow: He exhibits swelling. Tenderness found. Olecranon process tenderness noted.  Lymphadenopathy:       Head (right side): No submandibular adenopathy present.       Head (left side): No submandibular adenopathy present.    He has no cervical adenopathy.  Neurological: He is alert and oriented to person, place, and time. He has normal strength. No cranial nerve deficit or sensory deficit.  Skin: Skin is warm and dry.  Psychiatric: He has a normal mood and affect. His speech is normal.  Nursing note and vitals reviewed.    ED Treatments / Results  Labs (all labs ordered are listed, but only abnormal results are displayed) Labs Reviewed - No data to display  EKG  EKG Interpretation None       Radiology Dg Elbow Complete Right  Result Date: 01/18/2017 CLINICAL DATA:  Right elbow pain after yard work. EXAM: RIGHT ELBOW - COMPLETE 3+ VIEW COMPARISON:  None. FINDINGS: There is no evidence of fracture, dislocation, or joint effusion. There is no evidence of arthropathy or other focal bone abnormality. Soft tissue swelling is seen overlying the olecranon suggesting inflammation or edema. IMPRESSION: No fracture or dislocation is noted. Soft tissue swelling is seen overlying the olecranon suggesting edema or inflammation. Electronically Signed   By: Marijo Conception, M.D.   On: 01/18/2017 14:48    Procedures Procedures (including critical care time)  Medications Ordered in ED Medications - No data to display   Initial Impression / Assessment and Plan / ED Course  I have reviewed the triage vital signs and the nursing notes.  Pertinent labs & imaging results that were available during my care of the patient were reviewed by me and  considered in my medical decision making (see chart for details).       Final Clinical Impressions(s) / ED Diagnoses MDM Vital signs stable. Exam suggest olecranon bursitis. No red streak, or temp elevation. Pt has scabbed area and abrasions present. Will cover for inflammation and infection. Rx for short course of Mobic and keflex given to the patient. Ace Wrap applied. Pt to follow up with MD's at [facility] next week. Pt in agreement with plan.   Final diagnoses:  Olecranon bursitis of right elbow    New Prescriptions Discharge Medication List as of 01/18/2017  3:04 PM    START taking these medications   Details  cephALEXin (KEFLEX) 500 MG capsule Take 1 capsule (500 mg  total) by mouth 3 (three) times daily., Starting Sat 01/18/2017, Print    meloxicam (MOBIC) 15 MG tablet Take 1 tablet (15 mg total) by mouth daily., Starting Sat 01/18/2017, Print         Lily Kocher, PA-C 01/19/17 0221    Forde Dandy, MD 01/19/17 [phone]

## 2017-01-22 ENCOUNTER — Ambulatory Visit (INDEPENDENT_AMBULATORY_CARE_PROVIDER_SITE_OTHER): Payer: Medicare Other | Admitting: Urology

## 2017-01-22 DIAGNOSIS — N401 Enlarged prostate with lower urinary tract symptoms: Secondary | ICD-10-CM

## 2017-01-23 ENCOUNTER — Other Ambulatory Visit: Payer: Self-pay | Admitting: Urology

## 2017-01-29 ENCOUNTER — Telehealth: Payer: Self-pay | Admitting: *Deleted

## 2017-01-29 NOTE — Telephone Encounter (Signed)
Pt aware - routed to Dr Alyson Ingles echo results and LOV - pt rescheduled for December with Dr Harl Bowie

## 2017-01-29 NOTE — Telephone Encounter (Signed)
-----   Message from Arnoldo Lenis, MD sent at 01/27/2017  2:50 PM EDT ----- Echo looks good. Recommend proceeding with surgery, please foreward my addended clinic note to his surgeon. He is f/u with me should be 3 months   Zandra Abts MD

## 2017-02-07 ENCOUNTER — Other Ambulatory Visit (HOSPITAL_COMMUNITY): Payer: Medicare Other

## 2017-02-07 ENCOUNTER — Ambulatory Visit: Admit: 2017-02-07 | Payer: Medicare Other | Admitting: Interventional Radiology

## 2017-02-07 SURGERY — RADIO FREQUENCY ABLATION
Anesthesia: General | Laterality: Right

## 2017-02-12 NOTE — Patient Instructions (Signed)
Angel [name]  02/12/2017     @PREFPERIOPPHARMACY @   Your procedure is scheduled on 02/24/2017  Report to [facility] at  810  A.M.  Call this number if you have problems the morning of surgery:  [phone]   Remember:  Do not eat food or drink liquids after midnight.  Take these medicines the morning of surgery with A SIP OF WATER  Norvasc, mobic.   Do not wear jewelry, make-up or nail polish.  Do not wear lotions, powders, or perfumes, or deoderant.  Do not shave 48 hours prior to surgery.  Men may shave face and neck.  Do not bring valuables to the hospital.  [facility] is not responsible for any belongings or valuables.  Contacts, dentures or bridgework may not be worn into surgery.  Leave your suitcase in the car.  After surgery it may be brought to your room.  For patients admitted to the hospital, discharge time will be determined by your treatment team.  Patients discharged the day of surgery will not be allowed to drive home.   Name and phone number of your driver:   family Special instructions:  None  Please read over the following fact sheets that you were given. Anesthesia Post-op Instructions and Care and Recovery After Surgery       Cystoscopy Cystoscopy is a procedure that is used to help diagnose and sometimes treat conditions that affect that lower urinary tract. The lower urinary tract includes the bladder and the tube that drains urine from the bladder out of the body (urethra). Cystoscopy is performed with a thin, tube-shaped instrument with a light and camera at the end (cystoscope). The cystoscope may be hard (rigid) or flexible, depending on the goal of the procedure.The cystoscope is inserted through the urethra, into the bladder. Cystoscopy may be recommended if you have:  Urinary tractinfections that keep coming back (recurring).  Blood in the urine (hematuria).  Loss of bladder control (urinary incontinence) or an  overactive bladder.  Unusual cells found in a urine sample.  A blockage in the urethra.  Painful urination.  An abnormality in the bladder found during an intravenous pyelogram (IVP) or CT scan.  Cystoscopy may also be done to remove a sample of tissue to be examined under a microscope (biopsy). Tell a health care provider about:  Any allergies you have.  All medicines you are taking, including vitamins, herbs, eye drops, creams, and over-the-counter medicines.  Any problems you or family members have had with anesthetic medicines.  Any blood disorders you have.  Any surgeries you have had.  Any medical conditions you have.  Whether you are pregnant or may be pregnant. What are the risks? Generally, this is a safe procedure. However, problems may occur, including:  Infection.  Bleeding.  Allergic reactions to medicines.  Damage to other structures or organs.  What happens before the procedure?  Ask your health care provider about: ? Changing or stopping your regular medicines. This is especially important if you are taking diabetes medicines or blood thinners. ? Taking medicines such as aspirin and ibuprofen. These medicines can thin your blood. Do not take these medicines before your procedure if your health care provider instructs you not to.  Follow instructions from your health care provider about eating or drinking restrictions.  You may be given antibiotic medicine to help prevent infection.  You may have an exam or testing, such as  X-rays of the bladder, urethra, or kidneys.  You may have urine tests to check for signs of infection.  Plan to have someone take you home after the procedure. What happens during the procedure?  To reduce your risk of infection,your health care team will wash or sanitize their hands.  You will be given one or more of the following: ? A medicine to help you relax (sedative). ? A medicine to numb the area (local  anesthetic).  The area around the opening of your urethra will be cleaned.  The cystoscope will be passed through your urethra into your bladder.  Germ-free (sterile)fluid will flow through the cystoscope to fill your bladder. The fluid will stretch your bladder so that your surgeon can clearly examine your bladder walls.  The cystoscope will be removed and your bladder will be emptied. The procedure may vary among health care providers and hospitals. What happens after the procedure?  You may have some soreness or pain in your abdomen and urethra. Medicines will be available to help you.  You may have some blood in your urine.  Do not drive for 24 hours if you received a sedative. This information is not intended to replace advice given to you by your health care provider. Make sure you discuss any questions you have with your health care provider. Document Released: 04/26/2000 Document Revised: 09/07/2015 Document Reviewed: 03/16/2015 Elsevier Interactive Patient Education  2017 [place].  Cystoscopy, Care After Refer to this sheet in the next few weeks. These instructions provide you with information about caring for yourself after your procedure. Your health care provider may also give you more specific instructions. Your treatment has been planned according to current medical practices, but problems sometimes occur. Call your health care provider if you have any problems or questions after your procedure. What can I expect after the procedure? After the procedure, it is common to have:  Mild pain when you urinate. Pain should stop within a few minutes after you urinate. This may last for up to 1 week.  A small amount of blood in your urine for several days.  Feeling like you need to urinate but producing only a small amount of urine.  Follow these instructions at home:  Medicines  Take over-the-counter and prescription medicines only as told by your health care  provider.  If you were prescribed an antibiotic medicine, take it as told by your health care provider. Do not stop taking the antibiotic even if you start to feel better. General instructions   Return to your normal activities as told by your health care provider. Ask your health care provider what activities are safe for you.  Do not drive for 24 hours if you received a sedative.  Watch for any blood in your urine. If the amount of blood in your urine increases, call your health care provider.  Follow instructions from your health care provider about eating or drinking restrictions.  If a tissue sample was removed for testing (biopsy) during your procedure, it is your responsibility to get your test results. Ask your health care provider or the department performing the test when your results will be ready.  Drink enough fluid to keep your urine clear or pale yellow.  Keep all follow-up visits as told by your health care provider. This is important. Contact a health care provider if:  You have pain that gets worse or does not get better with medicine, especially pain when you urinate.  You have difficulty  urinating. Get help right away if:  You have more blood in your urine.  You have blood clots in your urine.  You have abdominal pain.  You have a fever or chills.  You are unable to urinate. This information is not intended to replace advice given to you by your health care provider. Make sure you discuss any questions you have with your health care provider. Document Released: 11/16/2004 Document Revised: 10/05/2015 Document Reviewed: 03/16/2015 Elsevier Interactive Patient Education  2017 [place] A bladder stone is a buildup of crystals made from the proteins and minerals found in urine. These substances build up when your urine becomes too concentrated. Bladder stones usually develop when you have another medical condition that prevents your bladder  from emptying completely. Crystals can form in the small amount of urine left in your bladder. Bladder stones that grow large can become painful and block the flow of urine. What are the causes? Bladder stones can be caused by:  An enlarged prostate, which prevents the bladder from emptying well.  A urinary tract infection (UTI).  A weak spot in the bladder that creates a small pouch (bladder diverticulum).  Nerve damage that may interfere with the messages from your brain to your bladder muscles (neurogenic bladder). This can result from conditions such as Parkinson disease or spinal cord injuries.  What increases the risk? This condition is more likely to develop in people who:  Get frequent UTIs.  Have another medical condition that affects their bladder.  Have a history of bladder surgery.  Have a spinal cord injury.  Have an abnormally shaped bladder (deformity).  What are the signs or symptoms? Small bladder stones do not always cause symptoms. Larger stones can cause symptoms that include:  Abdominal pain.  A frequent need to urinate.  Difficulty urinating.  Painful urination.  Blood in the urine.  Cloudy or dark colored urine.  Pain in the penis or testicles for men.  How is this diagnosed? This condition is diagnosed based on your symptoms, medical history, and a physical exam. The exam will include checking for abdominal tenderness. For men, a rectal exam may be done to check the prostate gland. You may also have other tests, such as:  A urine test (urinalysis) to find out more about your condition.  A urine sample test to check for other infections (culture).  Blood tests, including tests to look for a substance called creatinine. A creatinine level that is higher than normal could indicate a blockage.  A procedure to examine the inside of your bladder using a thin scope with a tiny lighted camera (cystoscopy) inserted through the urethra.  You may  also have imaging studies such as:  A CT scan of your abdomen and pelvis to look for a stone and check whether it is blocking the flow of urine.  An X-ray of your kidneys, ureters, bladder, and urethra after you have a type of dye (contrast material) injected into your veins (intravenous pyelogram or IVP).  An abdominal and pelvic ultrasound to locate bladder stones and identify areas where urine flow is blocked.  How is this treated? Small bladder stones do not require treatment. They can pass out of your body on their own. You may be instructed to drink extra water to help the stone pass through the bladder. Larger stones may need to be removed with one of the following procedures:  Cystolitholapaxy. A cystoscope is inserted through the urethra and into the bladder  to view the stone. A laser, ultrasound, or other device is used to break the stone into smaller pieces. Fluids are used to flush the small pieces from the area.  Surgical removal. You may need surgery to remove the stone if it is large and causing pain. A small incision is made in the bladder to directly remove the stone.  If the stone blocks the flow of urine, you may have a thin, flexible tube (stent) threaded into your ureter. The stent may be left in place after removal of a stone to ensure flow of urine until healing is complete.  Follow these instructions at home:  Drink enough fluid to keep your urine clear or pale yellow.  Report unusual urinary symptoms to your health care provider. Early diagnosis of an enlarged prostate and other bladder conditions may reduce your chance of getting bladder stones.  Avoid smoking and illegal drug use. Contact a health care provider if:  You have a fever.  You feel nauseous or vomit.  You are unable to urinate.  You have a large amount of blood in your urine. Get help right away if:  You have severe back pain or lower abdominal pain.  You are vomiting and cannot keep down  any medicines or water. This information is not intended to replace advice given to you by your health care provider. Make sure you discuss any questions you have with your health care provider. Document Released: 05/14/2015 Document Revised: 10/06/2015 Document Reviewed: 05/14/2015 Elsevier Interactive Patient Education  2018 [place] Anesthesia, Adult General anesthesia is the use of medicines to make a person "go to sleep" (be unconscious) for a medical procedure. General anesthesia is often recommended when a procedure:  Is long.  Requires you to be still or in an unusual position.  Is major and can cause you to lose blood.  Is impossible to do without general anesthesia.  The medicines used for general anesthesia are called general anesthetics. In addition to making you sleep, the medicines:  Prevent pain.  Control your blood pressure.  Relax your muscles.  Tell a health care provider about:  Any allergies you have.  All medicines you are taking, including vitamins, herbs, eye drops, creams, and over-the-counter medicines.  Any problems you or family members have had with anesthetic medicines.  Types of anesthetics you have had in the past.  Any bleeding disorders you have.  Any surgeries you have had.  Any medical conditions you have.  Any history of heart or lung conditions, such as heart failure, sleep apnea, or chronic obstructive pulmonary disease (COPD).  Whether you are pregnant or may be pregnant.  Whether you use tobacco, alcohol, marijuana, or street drugs.  Any history of Armed forces logistics/support/administrative officer.  Any history of depression or anxiety. What are the risks? Generally, this is a safe procedure. However, problems may occur, including:  Allergic reaction to anesthetics.  Lung and heart problems.  Inhaling food or liquids from your stomach into your lungs (aspiration).  Injury to nerves.  Waking up during your procedure and being unable to  move (rare).  Extreme agitation or a state of mental confusion (delirium) when you wake up from the anesthetic.  Air in the bloodstream, which can lead to stroke.  These problems are more likely to develop if you are having a major surgery or if you have an advanced medical condition. You can prevent some of these complications by answering all of your health care provider's questions thoroughly and  by following all pre-procedure instructions. General anesthesia can cause side effects, including:  Nausea or vomiting  A sore throat from the breathing tube.  Feeling cold or shivery.  Feeling tired, washed out, or achy.  Sleepiness or drowsiness.  Confusion or agitation.  What happens before the procedure? Staying hydrated Follow instructions from your health care provider about hydration, which may include:  Up to 2 hours before the procedure - you may continue to drink clear liquids, such as water, clear fruit juice, black coffee, and plain tea.  Eating and drinking restrictions Follow instructions from your health care provider about eating and drinking, which may include:  8 hours before the procedure - stop eating heavy meals or foods such as meat, fried foods, or fatty foods.  6 hours before the procedure - stop eating light meals or foods, such as toast or cereal.  6 hours before the procedure - stop drinking milk or drinks that contain milk.  2 hours before the procedure - stop drinking clear liquids.  Medicines  Ask your health care provider about: ? Changing or stopping your regular medicines. This is especially important if you are taking diabetes medicines or blood thinners. ? Taking medicines such as aspirin and ibuprofen. These medicines can thin your blood. Do not take these medicines before your procedure if your health care provider instructs you not to. ? Taking new dietary supplements or medicines. Do not take these during the week before your procedure  unless your health care provider approves them.  If you are told to take a medicine or to continue taking a medicine on the day of the procedure, take the medicine with sips of water. General instructions   Ask if you will be going home the same day, the following day, or after a longer hospital stay. ? Plan to have someone take you home. ? Plan to have someone stay with you for the first 24 hours after you leave the hospital or clinic.  For 3-6 weeks before the procedure, try not to use any tobacco products, such as cigarettes, chewing tobacco, and e-cigarettes.  You may brush your teeth on the morning of the procedure, but make sure to spit out the toothpaste. What happens during the procedure?  You will be given anesthetics through a mask and through an IV tube in one of your veins.  You may receive medicine to help you relax (sedative).  As soon as you are asleep, a breathing tube may be used to help you breathe.  An anesthesia specialist will stay with you throughout the procedure. He or she will help keep you comfortable and safe by continuing to give you medicines and adjusting the amount of medicine that you get. He or she will also watch your blood pressure, pulse, and oxygen levels to make sure that the anesthetics do not cause any problems.  If a breathing tube was used to help you breathe, it will be removed before you wake up. The procedure may vary among health care providers and hospitals. What happens after the procedure?  You will wake up, often slowly, after the procedure is complete, usually in a recovery area.  Your blood pressure, heart rate, breathing rate, and blood oxygen level will be monitored until the medicines you were given have worn off.  You may be given medicine to help you calm down if you feel anxious or agitated.  If you will be going home the same day, your health care provider may check to  make sure you can stand, drink, and urinate.  Your  health care providers will treat your pain and side effects before you go home.  Do not drive for 24 hours if you received a sedative.  You may: ? Feel nauseous and vomit. ? Have a sore throat. ? Have mental slowness. ? Feel cold or shivery. ? Feel sleepy. ? Feel tired. ? Feel sore or achy, even in parts of your body where you did not have surgery. This information is not intended to replace advice given to you by your health care provider. Make sure you discuss any questions you have with your health care provider. Document Released: 08/06/2007 Document Revised: 10/10/2015 Document Reviewed: 04/13/2015 Elsevier Interactive Patient Education  2018 [place] Anesthesia, Adult, Care After These instructions provide you with information about caring for yourself after your procedure. Your health care provider may also give you more specific instructions. Your treatment has been planned according to current medical practices, but problems sometimes occur. Call your health care provider if you have any problems or questions after your procedure. What can I expect after the procedure? After the procedure, it is common to have:  Vomiting.  A sore throat.  Mental slowness.  It is common to feel:  Nauseous.  Cold or shivery.  Sleepy.  Tired.  Sore or achy, even in parts of your body where you did not have surgery.  Follow these instructions at home: For at least 24 hours after the procedure:  Do not: ? Participate in activities where you could fall or become injured. ? Drive. ? Use heavy machinery. ? Drink alcohol. ? Take sleeping pills or medicines that cause drowsiness. ? Make important decisions or sign legal documents. ? Take care of children on your own.  Rest. Eating and drinking  If you vomit, drink water, juice, or soup when you can drink without vomiting.  Drink enough fluid to keep your urine clear or pale yellow.  Make sure you have little or no  nausea before eating solid foods.  Follow the diet recommended by your health care provider. General instructions  Have a responsible adult stay with you until you are awake and alert.  Return to your normal activities as told by your health care provider. Ask your health care provider what activities are safe for you.  Take over-the-counter and prescription medicines only as told by your health care provider.  If you smoke, do not smoke without supervision.  Keep all follow-up visits as told by your health care provider. This is important. Contact a health care provider if:  You continue to have nausea or vomiting at home, and medicines are not helpful.  You cannot drink fluids or start eating again.  You cannot urinate after 8-12 hours.  You develop a skin rash.  You have fever.  You have increasing redness at the site of your procedure. Get help right away if:  You have difficulty breathing.  You have chest pain.  You have unexpected bleeding.  You feel that you are having a life-threatening or urgent problem. This information is not intended to replace advice given to you by your health care provider. Make sure you discuss any questions you have with your health care provider. Document Released: 08/05/2000 Document Revised: 10/02/2015 Document Reviewed: 04/13/2015 Elsevier Interactive Patient Education  [employer].

## 2017-02-18 ENCOUNTER — Encounter (HOSPITAL_COMMUNITY): Payer: Self-pay

## 2017-02-18 ENCOUNTER — Encounter (HOSPITAL_COMMUNITY)
Admission: RE | Admit: 2017-02-18 | Discharge: 2017-02-18 | Disposition: A | Discharge status: Home / Self Care | Payer: Medicare Other | Source: Ambulatory Visit | Attending: Urology | Admitting: Urology

## 2017-02-18 DIAGNOSIS — N401 Enlarged prostate with lower urinary tract symptoms: Secondary | ICD-10-CM | POA: Insufficient documentation

## 2017-02-18 DIAGNOSIS — N210 Calculus in bladder: Secondary | ICD-10-CM | POA: Insufficient documentation

## 2017-02-18 DIAGNOSIS — Z01812 Encounter for preprocedural laboratory examination: Secondary | ICD-10-CM | POA: Diagnosis not present

## 2017-02-18 DIAGNOSIS — R338 Other retention of urine: Secondary | ICD-10-CM | POA: Diagnosis not present

## 2017-02-18 HISTORY — DX: Personal history of urinary calculi: Z87.442

## 2017-02-18 LAB — BASIC METABOLIC PANEL
Anion gap: 7 (ref 5–15)
BUN: 10 mg/dL (ref 6–20)
CALCIUM: 8.8 mg/dL — AB (ref 8.9–10.3)
CO2: 25 mmol/L (ref 22–32)
CREATININE: 1.16 mg/dL (ref 0.61–1.24)
Chloride: 107 mmol/L (ref 101–111)
GFR calc non Af Amer: 59 mL/min — ABNORMAL LOW (ref >60)
Glucose, Bld: 88 mg/dL (ref 65–99)
Potassium: 3.2 mmol/L — ABNORMAL LOW (ref 3.5–5.1)
SODIUM: 139 mmol/L (ref 135–145)

## 2017-02-18 LAB — CBC
HCT: 44.9 % (ref 39.0–52.0)
HEMOGLOBIN: 15.5 g/dL (ref 13.0–17.0)
MCH: 30.9 pg (ref 26.0–34.0)
MCHC: 34.5 g/dL (ref 30.0–36.0)
MCV: 89.4 fL (ref 78.0–100.0)
Platelets: 177 10*3/uL (ref 150–400)
RBC: 5.02 MIL/uL (ref 4.22–5.81)
RDW: 13.5 % (ref 11.5–15.5)
WBC: 6.6 10*3/uL (ref 4.0–10.5)

## 2017-02-19 NOTE — Pre-Procedure Instructions (Signed)
Dr Patsey Berthold aware of 3.2 potassium. No orders given.

## 2017-02-24 ENCOUNTER — Observation Stay (HOSPITAL_COMMUNITY)
Admission: RE | Admit: 2017-02-24 | Discharge: 2017-02-26 | Disposition: A | Discharge status: Home / Self Care | Payer: Medicare Other | Source: Ambulatory Visit | Attending: Urology | Admitting: Urology

## 2017-02-24 ENCOUNTER — Ambulatory Visit (HOSPITAL_COMMUNITY): Payer: Medicare Other | Admitting: Anesthesiology

## 2017-02-24 ENCOUNTER — Encounter (HOSPITAL_COMMUNITY): Payer: Self-pay | Admitting: *Deleted

## 2017-02-24 ENCOUNTER — Encounter (HOSPITAL_COMMUNITY)
Admission: RE | Disposition: A | Discharge status: Home / Self Care | Payer: Self-pay | Source: Ambulatory Visit | Attending: Urology

## 2017-02-24 DIAGNOSIS — Z888 Allergy status to other drugs, medicaments and biological substances status: Secondary | ICD-10-CM | POA: Insufficient documentation

## 2017-02-24 DIAGNOSIS — Z87891 Personal history of nicotine dependence: Secondary | ICD-10-CM | POA: Diagnosis not present

## 2017-02-24 DIAGNOSIS — Z85828 Personal history of other malignant neoplasm of skin: Secondary | ICD-10-CM | POA: Diagnosis not present

## 2017-02-24 DIAGNOSIS — R300 Dysuria: Secondary | ICD-10-CM | POA: Diagnosis not present

## 2017-02-24 DIAGNOSIS — R310 Gross hematuria: Secondary | ICD-10-CM | POA: Diagnosis not present

## 2017-02-24 DIAGNOSIS — E785 Hyperlipidemia, unspecified: Secondary | ICD-10-CM | POA: Insufficient documentation

## 2017-02-24 DIAGNOSIS — M109 Gout, unspecified: Secondary | ICD-10-CM | POA: Diagnosis not present

## 2017-02-24 DIAGNOSIS — I252 Old myocardial infarction: Secondary | ICD-10-CM | POA: Diagnosis not present

## 2017-02-24 DIAGNOSIS — K219 Gastro-esophageal reflux disease without esophagitis: Secondary | ICD-10-CM | POA: Insufficient documentation

## 2017-02-24 DIAGNOSIS — Z880 Allergy status to penicillin: Secondary | ICD-10-CM | POA: Insufficient documentation

## 2017-02-24 DIAGNOSIS — Z859 Personal history of malignant neoplasm, unspecified: Secondary | ICD-10-CM | POA: Diagnosis not present

## 2017-02-24 DIAGNOSIS — R3915 Urgency of urination: Secondary | ICD-10-CM | POA: Insufficient documentation

## 2017-02-24 DIAGNOSIS — N400 Enlarged prostate without lower urinary tract symptoms: Secondary | ICD-10-CM | POA: Diagnosis present

## 2017-02-24 DIAGNOSIS — Z86718 Personal history of other venous thrombosis and embolism: Secondary | ICD-10-CM | POA: Insufficient documentation

## 2017-02-24 DIAGNOSIS — I10 Essential (primary) hypertension: Secondary | ICD-10-CM | POA: Diagnosis not present

## 2017-02-24 DIAGNOSIS — Z87442 Personal history of urinary calculi: Secondary | ICD-10-CM | POA: Insufficient documentation

## 2017-02-24 DIAGNOSIS — N210 Calculus in bladder: Secondary | ICD-10-CM | POA: Insufficient documentation

## 2017-02-24 DIAGNOSIS — N401 Enlarged prostate with lower urinary tract symptoms: Principal | ICD-10-CM | POA: Insufficient documentation

## 2017-02-24 DIAGNOSIS — I8391 Asymptomatic varicose veins of right lower extremity: Secondary | ICD-10-CM | POA: Diagnosis not present

## 2017-02-24 DIAGNOSIS — I2510 Atherosclerotic heart disease of native coronary artery without angina pectoris: Secondary | ICD-10-CM | POA: Insufficient documentation

## 2017-02-24 DIAGNOSIS — I739 Peripheral vascular disease, unspecified: Secondary | ICD-10-CM | POA: Diagnosis not present

## 2017-02-24 DIAGNOSIS — R350 Frequency of micturition: Secondary | ICD-10-CM | POA: Diagnosis not present

## 2017-02-24 DIAGNOSIS — Z79899 Other long term (current) drug therapy: Secondary | ICD-10-CM | POA: Insufficient documentation

## 2017-02-24 DIAGNOSIS — N138 Other obstructive and reflux uropathy: Secondary | ICD-10-CM

## 2017-02-24 HISTORY — PX: HOLMIUM LASER APPLICATION: SHX5852

## 2017-02-24 HISTORY — PX: CYSTOSCOPY WITH URETHRAL DILATATION: SHX5125

## 2017-02-24 HISTORY — PX: CYSTOSCOPY WITH LITHOLAPAXY: SHX1425

## 2017-02-24 SURGERY — CYSTOSCOPY, WITH BLADDER CALCULUS LITHOLAPAXY
Anesthesia: General

## 2017-02-24 MED ORDER — FENTANYL CITRATE (PF) 100 MCG/2ML IJ SOLN
INTRAMUSCULAR | Status: DC | PRN
Start: 2017-02-24 — End: 2017-02-24
  Administered 2017-02-24 (×4): 25 ug via INTRAVENOUS

## 2017-02-24 MED ORDER — FENTANYL CITRATE (PF) 100 MCG/2ML IJ SOLN: 25.0000 ug | INTRAMUSCULAR | Status: DC | PRN

## 2017-02-24 MED ORDER — SODIUM CHLORIDE 0.9 % IV SOLN
INTRAVENOUS | Status: DC
  Administered 2017-02-24: 17:00:00 via INTRAVENOUS

## 2017-02-24 MED ORDER — AMLODIPINE BESYLATE 5 MG PO TABS
5.0000 mg | ORAL_TABLET | Freq: Every day | ORAL | Status: DC
  Administered 2017-02-25 – 2017-02-26 (×2): 5 mg via ORAL
  Filled 2017-02-24 (×3): qty 1

## 2017-02-24 MED ORDER — DIPHENHYDRAMINE HCL 12.5 MG/5ML PO ELIX: 12.5000 mg | ORAL_SOLUTION | Freq: Four times a day (QID) | ORAL | Status: DC | PRN

## 2017-02-24 MED ORDER — DIPHENHYDRAMINE HCL 50 MG/ML IJ SOLN: 12.5000 mg | Freq: Four times a day (QID) | INTRAMUSCULAR | Status: DC | PRN

## 2017-02-24 MED ORDER — ONDANSETRON HCL 4 MG/2ML IJ SOLN
4.0000 mg | INTRAMUSCULAR | Status: DC | PRN
  Administered 2017-02-24: 4 mg via INTRAVENOUS
  Filled 2017-02-24: qty 2

## 2017-02-24 MED ORDER — OXYCODONE-ACETAMINOPHEN 5-325 MG PO TABS: 1.0000 | ORAL_TABLET | Freq: Four times a day (QID) | ORAL | 0 refills | Status: DC | PRN

## 2017-02-24 MED ORDER — FENTANYL CITRATE (PF) 100 MCG/2ML IJ SOLN
INTRAMUSCULAR | Status: AC
  Filled 2017-02-24: qty 2

## 2017-02-24 MED ORDER — MIDAZOLAM HCL 2 MG/2ML IJ SOLN
INTRAMUSCULAR | Status: AC
  Filled 2017-02-24: qty 2

## 2017-02-24 MED ORDER — MIDAZOLAM HCL 2 MG/2ML IJ SOLN
1.0000 mg | INTRAMUSCULAR | Status: DC
  Administered 2017-02-24: 2 mg via INTRAVENOUS

## 2017-02-24 MED ORDER — OXYCODONE-ACETAMINOPHEN 5-325 MG PO TABS
1.0000 | ORAL_TABLET | ORAL | Status: DC | PRN
  Administered 2017-02-25 (×2): 1 via ORAL
  Filled 2017-02-24: qty 1
  Filled 2017-02-24: qty 2
  Filled 2017-02-24: qty 1

## 2017-02-24 MED ORDER — CEFAZOLIN SODIUM-DEXTROSE 2-4 GM/100ML-% IV SOLN
2.0000 g | INTRAVENOUS | Status: AC
  Administered 2017-02-24: 2 g via INTRAVENOUS
  Filled 2017-02-24: qty 100

## 2017-02-24 MED ORDER — STERILE WATER FOR IRRIGATION IR SOLN
Status: DC | PRN
  Administered 2017-02-24: 3000 mL via INTRAVESICAL

## 2017-02-24 MED ORDER — LACTATED RINGERS IV SOLN
INTRAVENOUS | Status: DC
  Administered 2017-02-24: 09:00:00 via INTRAVENOUS

## 2017-02-24 MED ORDER — LIDOCAINE HCL (CARDIAC) 20 MG/ML IV SOLN
INTRAVENOUS | Status: DC | PRN
  Administered 2017-02-24: 30 mg via INTRAVENOUS

## 2017-02-24 MED ORDER — PROPOFOL 10 MG/ML IV BOLUS
INTRAVENOUS | Status: AC
  Filled 2017-02-24: qty 20

## 2017-02-24 MED ORDER — STERILE WATER FOR IRRIGATION IR SOLN
Status: DC | PRN
  Administered 2017-02-24: 500 mL

## 2017-02-24 MED ORDER — PROPOFOL 10 MG/ML IV BOLUS
INTRAVENOUS | Status: DC | PRN
  Administered 2017-02-24: 50 mg via INTRAVENOUS
  Administered 2017-02-24: 30 mg via INTRAVENOUS
  Administered 2017-02-24: 40 mg via INTRAVENOUS
  Administered 2017-02-24: 30 mg via INTRAVENOUS
  Administered 2017-02-24: 130 mg via INTRAVENOUS

## 2017-02-24 MED ORDER — ZOLPIDEM TARTRATE 5 MG PO TABS
5.0000 mg | ORAL_TABLET | Freq: Every evening | ORAL | Status: DC | PRN
  Administered 2017-02-24: 5 mg via ORAL
  Filled 2017-02-24: qty 1

## 2017-02-24 MED ORDER — LIDOCAINE HCL (PF) 1 % IJ SOLN
INTRAMUSCULAR | Status: AC
  Filled 2017-02-24: qty 5

## 2017-02-24 MED ORDER — SODIUM CHLORIDE 0.9 % IR SOLN
Status: DC | PRN
  Administered 2017-02-24 (×2): 3000 mL via INTRAVESICAL

## 2017-02-24 MED ORDER — FENTANYL CITRATE (PF) 100 MCG/2ML IJ SOLN
25.0000 ug | INTRAMUSCULAR | Status: DC | PRN
  Administered 2017-02-24 (×2): 25 ug via INTRAVENOUS
  Filled 2017-02-24: qty 2

## 2017-02-24 MED ORDER — BELLADONNA ALKALOIDS-OPIUM 16.2-60 MG RE SUPP
1.0000 | Freq: Four times a day (QID) | RECTAL | Status: DC | PRN
  Administered 2017-02-25: 1 via RECTAL
  Filled 2017-02-24: qty 1

## 2017-02-24 MED ORDER — HYDROMORPHONE HCL 1 MG/ML IJ SOLN
0.5000 mg | INTRAMUSCULAR | Status: DC | PRN
  Administered 2017-02-24 – 2017-02-25 (×4): 1 mg via INTRAVENOUS
  Filled 2017-02-24 (×5): qty 1

## 2017-02-24 SURGICAL SUPPLY — 23 items
BAG DRAIN URO TABLE W/ADPT NS (DRAPE) ×4 IMPLANT
BAG DRN 8 ADPR NS SKTRN CSTL (DRAPE) ×2
BAG DRN URN TUBE DRIP CHMBR (OSTOMY) ×2
BAG HAMPER (MISCELLANEOUS) ×4 IMPLANT
BAG URINE DRAIN TURP 4L (OSTOMY) ×2 IMPLANT
CATH FOLEY 3WAY 30CC 22F (CATHETERS) ×2 IMPLANT
GLOVE BIO SURGEON STRL SZ8 (GLOVE) ×4 IMPLANT
GLOVE BIOGEL PI IND STRL 6.5 (GLOVE) IMPLANT
GLOVE BIOGEL PI IND STRL 7.0 (GLOVE) ×2 IMPLANT
GLOVE BIOGEL PI INDICATOR 6.5 (GLOVE) ×2
GLOVE BIOGEL PI INDICATOR 7.0 (GLOVE) ×4
GLOVE ECLIPSE 6.5 STRL STRAW (GLOVE) ×4 IMPLANT
GOWN STRL REUS W/TWL LRG LVL3 (GOWN DISPOSABLE) ×4 IMPLANT
GOWN STRL REUS W/TWL XL LVL3 (GOWN DISPOSABLE) ×4 IMPLANT
IV NS IRRIG 3000ML ARTHROMATIC (IV SOLUTION) ×4 IMPLANT
KIT ROOM TURNOVER AP CYSTO (KITS) ×2 IMPLANT
LASER FIBER DISP 1000U (UROLOGICAL SUPPLIES) ×4 IMPLANT
PACK CYSTO (CUSTOM PROCEDURE TRAY) ×4 IMPLANT
PAD ARMBOARD 7.5X6 YLW CONV (MISCELLANEOUS) ×4 IMPLANT
SYR 30ML LL (SYRINGE) ×2 IMPLANT
SYRINGE IRR TOOMEY STRL 70CC (SYRINGE) ×2 IMPLANT
WATER STERILE IRR 1000ML POUR (IV SOLUTION) ×4 IMPLANT
WATER STERILE IRR 3000ML UROMA (IV SOLUTION) ×2 IMPLANT

## 2017-02-24 NOTE — Discharge Instructions (Signed)
Indwelling Urinary Catheter Care, Adult °Take good care of your catheter to keep it working and to prevent problems. °How to wear your catheter °Attach your catheter to your leg with tape (adhesive tape) or a leg strap. Make sure it is not too tight. If you use tape, remove any bits of tape that are already on the catheter. °How to wear a drainage bag °You should have: °· A large overnight bag. °· A small leg bag. ° °Overnight Bag °You may wear the overnight bag at any time. Always keep the bag below the level of your bladder but off the floor. When you sleep, put a clean plastic bag in a wastebasket. Then hang the bag inside the wastebasket. °Leg Bag °Never wear the leg bag at night. Always wear the leg bag below your knee. Keep the leg bag secure with a leg strap or tape. °How to care for your skin °· Clean the skin around the catheter at least once every day. °· Shower every day. Do not take baths. °· Put creams, lotions, or ointments on your genital area only as told by your doctor. °· Do not use powders, sprays, or lotions on your genital area. °How to clean your catheter and your skin °1. Wash your hands with soap and water. °2. Wet a washcloth in warm water and gentle (mild) soap. °3. Use the washcloth to clean the skin where the catheter enters your body. Clean downward and wipe away from the catheter in small circles. Do not wipe toward the catheter. °4. Pat the area dry with a clean towel. Make sure to clean off all soap. °How to care for your drainage bags °Empty your drainage bag when it is ?-½ full or at least 2-3 times a day. Replace your drainage bag once a month or sooner if it starts to smell bad or look dirty. Do not clean your drainage bag unless told by your doctor. °Emptying a drainage bag ° °Supplies Needed °· Rubbing alcohol. °· Gauze pad or cotton ball. °· Tape or a leg strap. ° °Steps °1. Wash your hands with soap and water. °2. Separate (detach) the bag from your leg. °3. Hold the bag over  the toilet or a clean container. Keep the bag below your hips and bladder. This stops pee (urine) from going back into the tube. °4. Open the pour spout at the bottom of the bag. °5. Empty the pee into the toilet or container. Do not let the pour spout touch any surface. °6. Put rubbing alcohol on a gauze pad or cotton ball. °7. Use the gauze pad or cotton ball to clean the pour spout. °8. Close the pour spout. °9. Attach the bag to your leg with tape or a leg strap. °10. Wash your hands. ° °Changing a drainage bag °Supplies Needed °· Alcohol wipes. °· A clean drainage bag. °· Adhesive tape or a leg strap. ° °Steps °1. Wash your hands with soap and water. °2. Separate the dirty bag from your leg. °3. Pinch the rubber catheter with your fingers so that pee does not spill out. °4. Separate the catheter tube from the drainage tube where these tubes connect (at the connection valve). Do not let the tubes touch any surface. °5. Clean the end of the catheter tube with an alcohol wipe. Use a different alcohol wipe to clean the end of the drainage tube. °6. Connect the catheter tube to the drainage tube of the clean bag. °7. Attach the new bag to   the leg with adhesive tape or a leg strap. °8. Wash your hands. ° °How to prevent infection and other problems °· Never pull on your catheter or try to remove it. Pulling can damage tissue in your body. °· Always wash your hands before and after touching your catheter. °· If a leg strap gets wet, replace it with a dry one. °· Drink enough fluids to keep your pee clear or pale yellow, or as told by your doctor. °· Do not let the drainage bag or tubing touch the floor. °· Wear cotton underwear. °· If you are male, wipe from front to back after you poop (have a bowel movement). °· Check on the catheter often to make sure it works and the tubing is not twisted. °Get help if: °· Your pee is cloudy. °· Your pee smells unusually bad. °· Your pee is not draining into the bag. °· Your  tube gets clogged. °· Your catheter starts to leak. °· Your bladder feels full. °Get help right away if: °· You have redness, swelling, or pain where the catheter enters your body. °· You have fluid, pus, or a bad smell coming from the area where the catheter enters your body. °· The area where the catheter enters your body feels warm. °· You have a fever. °· You have pain in your: °? Stomach (abdomen). °? Legs. °? Lower back. °? Bladder. °· You see blood fill the catheter. °· Your pee is pink or red. °· You feel sick to your stomach (nauseous). °· You throw up (vomit). °· You have chills. °· Your catheter gets pulled out. °This information is not intended to replace advice given to you by your health care provider. Make sure you discuss any questions you have with your health care provider. °Document Released: 08/24/2012 Document Revised: 03/27/2016 Document Reviewed: 10/12/2013 °Elsevier Interactive Patient Education © 2018 [employer] ° °PATIENT INSTRUCTIONS °POST-ANESTHESIA ° °IMMEDIATELY FOLLOWING SURGERY:  Do not drive or operate machinery for the first twenty four hours after surgery.  Do not make any important decisions for twenty four hours after surgery or while taking narcotic pain medications or sedatives.  If you develop intractable nausea and vomiting or a severe headache please notify your doctor immediately. ° °FOLLOW-UP:  Please make an appointment with your surgeon as instructed. You do not need to follow up with anesthesia unless specifically instructed to do so. ° °WOUND CARE INSTRUCTIONS (if applicable):  Keep a dry clean dressing on the anesthesia/puncture wound site if there is drainage.  Once the wound has quit draining you may leave it open to air.  Generally you should leave the bandage intact for twenty four hours unless there is drainage.  If the epidural site drains for more than 36-48 hours please call the anesthesia department. ° °QUESTIONS?:  Please feel free to call your physician  or the hospital operator if you have any questions, and they will be happy to assist you.    ° ° ° ° °

## 2017-02-24 NOTE — Transfer of Care (Signed)
Immediate Anesthesia Transfer of Care Note  Patient: Angel [name]  Procedure(s) Performed: CYSTOSCOPY WITH LITHOLAPAXY (N/A ) HOLMIUM LASER APPLICATION OF BLADDER CALCULI (N/A ) CYSTOSCOPY WITH URETHRAL DILATATION  Patient Location: PACU  Anesthesia Type:General  Level of Consciousness: awake and confused  Airway & Oxygen Therapy: Patient Spontanous Breathing and Patient connected to face mask oxygen  Post-op Assessment: Report given to RN and Post -op Vital signs reviewed and stable  Post vital signs: Reviewed and stable  Last Vitals:  Vitals:   02/24/17 0845 02/24/17 0900  BP: (!) 158/66 (!) 160/69  Resp: (!) 22 14  Temp:    SpO2: 95% 96%    Last Pain:  Vitals:   02/24/17 0803  TempSrc: Oral  PainSc: 4       Patients Stated Pain Goal: 9 (14/78/29 5621)  Complications: No apparent anesthesia complications

## 2017-02-24 NOTE — Anesthesia Procedure Notes (Signed)
Procedure Name: LMA Insertion Date/Time: 02/24/2017 9:33 AM Performed by: Andree Elk, Kaiyden Simkin A Pre-anesthesia Checklist: Patient identified, Timeout performed, Emergency Drugs available, Suction available and Patient being monitored Patient Re-evaluated:Patient Re-evaluated prior to induction Oxygen Delivery Method: Circle system utilized Preoxygenation: Pre-oxygenation with 100% oxygen Induction Type: IV induction Ventilation: Mask ventilation without difficulty LMA: LMA inserted LMA Size: 5.0 Number of attempts: 1 Placement Confirmation: positive ETCO2 and breath sounds checked- equal and bilateral Tube secured with: Tape Dental Injury: Teeth and Oropharynx as per pre-operative assessment

## 2017-02-24 NOTE — Anesthesia Postprocedure Evaluation (Signed)
Anesthesia Post Note  Patient: Angel [name]  Procedure(s) Performed: CYSTOSCOPY WITH LITHOLAPAXY (N/A ) HOLMIUM LASER APPLICATION OF BLADDER CALCULI (N/A ) CYSTOSCOPY WITH URETHRAL DILATATION  Patient location during evaluation: PACU Anesthesia Type: General Level of consciousness: awake and alert and patient cooperative Pain management: pain level controlled Vital Signs Assessment: post-procedure vital signs reviewed and stable Respiratory status: spontaneous breathing and patient connected to face mask oxygen Cardiovascular status: stable Postop Assessment: no apparent nausea or vomiting Anesthetic complications: no     Last Vitals:  Vitals:   02/24/17 0900 02/24/17 1115  BP: (!) 160/69 (P) 136/60  Pulse:  61  Resp: 14 (P) 17  Temp:  (!) (P) 36.4 C  SpO2: 96% (P) 97%    Last Pain:  Vitals:   02/24/17 1115  TempSrc:   PainSc: (P) 2                  Takisha Pelle A

## 2017-02-24 NOTE — Op Note (Signed)
Preoperative diagnosis: BPH with LUTS, incomplete emptying, bladder calculus  Postoperative diagnosis: same  Procedure: 1 cystoscopy 2. cystolithalopaxy for a stone over 2.5cm  Attending: Nicolette Bang  Anesthesia: General  Estimated blood loss: Minimal  Drains: 22 french foley  Specimens: 1. Bladder calculus  Antibiotics: ancef  Findings:  Ureteral orifices in normal anatomic location.  Over 76 1-3cm bladder calculi  Indications: Patient is a 76 year old male with a history of BPH and bladder calculus.  After discussing treatment options, they decided proceed with cystolithalopaxy.  Procedure her in detail: The patient was brought to the operating room and a brief timeout was done to ensure correct patient, correct procedure, correct site.  General anesthesia was administered patient was placed in dorsal lithotomy position.  Their genitalia was then prepped and draped in usual sterile fashion.  A rigid 72 French cystoscope was passed in the urethra and the bladder.  Bladder was inspected and we noted no masses or lesions.  the ureteral orifices were in the normal orthotopic locations. Using the 1000nm laser fiber the bladder calculus was fragmented and the fragments were then irrigated from the bladder. The stone fragments were the sent for analysis. The bladder was then drained and this concluded the procedure which was well tolerated by patient.  Complications: None  Condition: Stable, extubated, transferred to PACU  Plan: Patient is to be admitted due to significant gross hematuria after the procedure. He will be discharge home tomorrow and followup on Wednesday for voiding trialHe will followup in 2 weeks for stone analysis discussion

## 2017-02-24 NOTE — H&P (Signed)
Urology Admission H&P  Chief Complaint: gross hematuria  History of Present Illness: Angel [name] is a 76yo with gross hematuria and BPH who has numerous bladder calculi.   Past Medical History:  Diagnosis Date  . Bladder stones   . CAD (coronary artery disease)    BMS RCA 8/11; LVEF 50-55%  . Cancer ([place])   . Deep vein thrombosis (DVT) ([place])   . Gall bladder stones   . GERD (gastroesophageal reflux disease)   . Gout   . History of kidney stones   . HLD (hyperlipidemia)   . HTN (hypertension)   . MI (myocardial infarction) ([place])    MSTEMI 8/11  . Pneumonia 2013  . Rash, skin    on lower legs  . Renal disorder    kidney growth  . 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 COLONOSCOPY    . COLONOSCOPY    . CORONARY ANGIOPLASTY  2010   Dr Johnsie Cancel  . DOPPLER ECHOCARDIOGRAPHY    . EAR CYST EXCISION Left 07/23/2012   Procedure: EXCISION LEFT EAR LOBE;  Surgeon: Melissa Montane, MD;  Location: [facility];  Service: ENT;  Laterality: Left;    Home Medications:  Prescriptions Prior to Admission  Medication Sig Dispense Refill Last Dose  . amLODipine (NORVASC) 5 MG tablet Take 5 mg by mouth daily.   02/24/2017 at 0700  . nitroGLYCERIN (NITROSTAT) 0.4 MG SL tablet Place 0.4 mg under the tongue every 5 (five) minutes as needed for chest pain.      . cephALEXin (KEFLEX) 500 MG capsule Take 1 capsule (500 mg total) by mouth 3 (three) times daily. (Patient not taking: Reported on 02/13/2017) 15 capsule 0 Not Taking at Unknown time  . meloxicam (MOBIC) 15 MG tablet Take 1 tablet (15 mg total) by mouth daily. (Patient not taking: Reported on 02/13/2017) 6 tablet 0 Not Taking at Unknown time   Allergies:  Allergies  Allergen Reactions  . Crestor [Rosuvastatin] Other (See Comments)    Unknown   . Penicillins Other (See Comments)    Per patient causes gi upset with high doses. He says  he does OK with shots but gets GI upset with PO penicillins Can take small doses with no issues  . Tramadol Other (See Comments)    Confusion    History reviewed. No pertinent family history. Social History:  reports that he quit smoking about 17 years ago. His smoking use included Cigarettes. He has a 45.00 pack-year smoking history. He has never used smokeless tobacco. He reports that he does not drink alcohol or use drugs.  Review of Systems  Genitourinary: Positive for dysuria, frequency, hematuria and urgency.  All other systems reviewed and are negative.   Physical Exam:  Vital signs in last 24 hours: Temp:  [97.6 F (36.4 C)] 97.6 F (36.4 C) (10/15 0804) Resp:  [12-22] 22 (10/15 0845) BP: (158-188)/(66-78) 158/66 (10/15 0845) SpO2:  [95 %-98 %] 95 % (10/15 0845) Physical Exam  Constitutional: He is oriented to person, place, and time. He appears well-developed and well-nourished.  HENT:  Head: Normocephalic and atraumatic.  Eyes: Pupils are equal, round, and reactive to light. EOM are normal.  Neck: Normal range of motion. No thyromegaly present.  Cardiovascular: Normal rate and regular rhythm.   Respiratory: Effort normal. No respiratory distress.  GI: Soft. He  exhibits no distension.  Musculoskeletal: Normal range of motion. He exhibits no edema.  Neurological: He is alert and oriented to person, place, and time.  Skin: Skin is warm and dry.  Psychiatric: He has a normal mood and affect. His behavior is normal. Judgment and thought content normal.    Laboratory Data:  No results found for this or any previous visit (from the past 24 hour(s)). No results found for this or any previous visit (from the past 240 hour(s)). Creatinine:  Recent Labs  02/18/17 1011  CREATININE 1.16   Baseline Creatinine: 1.1  Impression/Assessment:  75yo with bladder calculi  Plan:  The risks/benefits/alternatives to cystolithalopaxy was explained to the patient and he  understands and wishes to proceed with surgery  Nicolette Bang 02/24/2017, 9:02 AM

## 2017-02-24 NOTE — Anesthesia Preprocedure Evaluation (Signed)
Anesthesia Evaluation  Patient identified by MRN, date of birth, ID band Patient awake    Reviewed: Allergy & Precautions, H&P , NPO status , Patient's Chart, lab work & pertinent test results  Airway Mallampati: I  TM Distance: >3 FB Neck ROM: full    Dental  (+) Edentulous Upper, Edentulous Lower   Pulmonary former smoker,    breath sounds clear to auscultation       Cardiovascular hypertension, Pt. on medications + CAD, + Past MI and + Peripheral Vascular Disease   Rhythm:regular Rate:Normal     Neuro/Psych    GI/Hepatic GERD  Controlled and Medicated,  Endo/Other    Renal/GU      Musculoskeletal   Abdominal   Peds  Hematology   Anesthesia Other Findings   Reproductive/Obstetrics                             Anesthesia Physical Anesthesia Plan  ASA: III  Anesthesia Plan: General   Post-op Pain Management:    Induction: Intravenous  PONV Risk Score and Plan:   Airway Management Planned: LMA  Additional Equipment:   Intra-op Plan:   Post-operative Plan: Extubation in OR  Informed Consent: I have reviewed the patients History and Physical, chart, labs and discussed the procedure including the risks, benefits and alternatives for the proposed anesthesia with the patient or authorized representative who has indicated his/her understanding and acceptance.     Plan Discussed with:   Anesthesia Plan Comments:         Anesthesia Quick Evaluation

## 2017-02-25 ENCOUNTER — Encounter (HOSPITAL_COMMUNITY): Payer: Self-pay | Admitting: Urology

## 2017-02-25 DIAGNOSIS — N401 Enlarged prostate with lower urinary tract symptoms: Secondary | ICD-10-CM | POA: Diagnosis not present

## 2017-02-25 NOTE — Addendum Note (Signed)
Addendum  created 02/25/17 1042 by Mickel Baas, CRNA   Sign clinical note

## 2017-02-25 NOTE — Anesthesia Postprocedure Evaluation (Signed)
Anesthesia Post Note  Patient: Angel [name]  Procedure(s) Performed: CYSTOSCOPY WITH LITHOLAPAXY (N/A ) HOLMIUM LASER APPLICATION OF BLADDER CALCULI (N/A ) CYSTOSCOPY WITH URETHRAL DILATATION  Patient location during evaluation: Nursing Unit Anesthesia Type: General Level of consciousness: awake and alert and oriented Pain management: pain level controlled Vital Signs Assessment: post-procedure vital signs reviewed and stable Respiratory status: spontaneous breathing Cardiovascular status: stable : Some nausea yesterday relieved with zofran. Anesthetic complications: no     Last Vitals:  Vitals:   02/24/17 1703 02/24/17 2106  BP: (!) 131/49 128/62  Pulse: 78 68  Resp: 20 20  Temp: 36.9 C 36.7 C  SpO2: 90% 93%    Last Pain:  Vitals:   02/25/17 1006  TempSrc:   PainSc: 4                  Patte Winkel A

## 2017-02-26 DIAGNOSIS — N401 Enlarged prostate with lower urinary tract symptoms: Secondary | ICD-10-CM | POA: Diagnosis not present

## 2017-03-04 NOTE — Discharge Summary (Signed)
Physician Discharge Summary  Patient ID: Angel [name] MRN: [id number] DOB/AGE: 77/23/1942 77 y.o.  Admit date: 02/24/2017 Discharge date: 02/26/2017  Admission Diagnoses: BPH, gross hematuria Discharge Diagnoses:  Active Problems:   BPH (benign prostatic hyperplasia)   Discharged Condition: good  Hospital Course: The patient tolerated the procedure well and was transferred to the floor on IV pain meds, IV fluid. On POD#1 pt was started on clear liquid diet and they ambulated in the halls. On POD#2 the foley was removed, the patient was transitioned to a regular diet, IVFs were discontinued, and the patient passed flatus. Prior to discharge the pt was tolerating a regular diet, pain was controlled on PO pain meds, they were ambulating without difficulty, and they had normal bowel function.  Consults: None  Significant Diagnostic Studies: none  Treatments: surgery: cystolithalopaxy  Discharge Exam: Blood pressure 128/61, pulse 63, temperature 98.2 F (36.8 C), temperature source Oral, resp. rate 20, height 5\' 10"  (1.778 m), weight 80.3 kg (177 lb), SpO2 95 %. General appearance: alert, cooperative and appears stated age Head: Normocephalic, without obvious abnormality, atraumatic Nose: Nares normal. Septum midline. Mucosa normal. No drainage or sinus tenderness. Neck: no adenopathy, no carotid bruit, no JVD, supple, symmetrical, trachea midline and thyroid not enlarged, symmetric, no tenderness/mass/nodules Resp: clear to auscultation bilaterally Cardio: regular rate and rhythm, S1, S2 normal, no murmur, click, rub or gallop GI: soft, non-tender; bowel sounds normal; no masses,  no organomegaly Extremities: extremities normal, atraumatic, no cyanosis or edema Neurologic: Grossly normal  Disposition: 01-Home or Self Care  Discharge Instructions    Discharge patient    Complete by:  As directed    Discharge disposition:  01-Home or Self Care   Discharge patient date:   02/25/2017   Discharge patient    Complete by:  As directed    Discharge disposition:  01-Home or Self Care   Discharge patient date:  02/26/2017     Allergies as of 02/26/2017      Reactions   Crestor [rosuvastatin] Other (See Comments)   Unknown    Penicillins Other (See Comments)   Per patient causes gi upset with high doses. He says he does OK with shots but gets GI upset with PO penicillins Can take small doses with no issues   Tramadol Other (See Comments)   Confusion      Medication List    TAKE these medications   amLODipine 5 MG tablet Commonly known as:  NORVASC Take 5 mg by mouth daily.   cephALEXin 500 MG capsule Commonly known as:  KEFLEX Take 1 capsule (500 mg total) by mouth 3 (three) times daily.   meloxicam 15 MG tablet Commonly known as:  MOBIC Take 1 tablet (15 mg total) by mouth daily.   nitroGLYCERIN 0.4 MG SL tablet Commonly known as:  NITROSTAT Place 0.4 mg under the tongue every 5 (five) minutes as needed for chest pain.   oxyCODONE-acetaminophen 5-325 MG tablet Commonly known as:  ROXICET Take 1 tablet by mouth every 6 (six) hours as needed.      Follow-up Information    Jerrell Mangel, Candee Furbish, MD Follow up.   Specialty:  Urology Why:  March 03, 2017, Monday at 9:00 AM in [place], not [place]. Contact information: [address] [place] [address] [phone]           Signed: Nicolette Bang 03/04/2017, 8:59 PM

## 2017-03-06 LAB — STONE ANALYSIS
CA OXALATE, MONOHYDR.: 5 %
CA PHOS CRY STONE QL IR: 2 %
Stone Weight KSTONE: 10741 mg
URIC ACID: 93 %

## 2017-03-07 ENCOUNTER — Ambulatory Visit: Payer: Medicare Other | Admitting: Cardiology

## 2017-04-18 ENCOUNTER — Ambulatory Visit: Payer: Medicare Other | Admitting: Cardiology

## 2017-04-18 NOTE — Progress Notes (Deleted)
Clinical Summary Angel [name] is a 76 y.o.male    1. Preoperative cardiac evaluation - patient with kidney mass. 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    2. CAD - history of prior stent  in 9/ 2011 to RCA - echo 01/2017 LVEF 60-65%, no WMAs, grade I diastolic dysfunction  - no recent chest pain, no SOB/DOE - mild LE edema which is chronic - highest level of activity is yardwork which he tolerates - reports can walk up to 2 flights of stairs without troubles, can walk several blocks without symptoms - compliant with meds  - he is off aspirin due hematuria 6 months ago. UTI at the time.  - crestor listed as allergy, he does not recall the specifics.    3. DVT - completed eliquis course, followed by pcp - had some bloody stools on eliquis.   4. Hematuria - followed by urology Past Medical History:  Diagnosis Date  . Bladder stones   . CAD (coronary artery disease)    BMS RCA 8/11; LVEF 50-55%  . Cancer ([place])   . Deep vein thrombosis (DVT) ([place])   . Gall bladder stones   . GERD (gastroesophageal reflux disease)   . Gout   . History of kidney stones   . HLD (hyperlipidemia)   . HTN (hypertension)   . MI (myocardial infarction) ([place])    MSTEMI 8/11  . Pneumonia 2013  . Rash, skin    on lower legs  . Renal disorder    kidney growth  . 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 Crestor [Rosuvastatin] Other (See Comments)    Unknown   . Penicillins Other (See Comments)    Per patient causes gi upset with high doses. He says he does OK with shots but gets GI upset with PO penicillins Can take small doses with no issues  . Tramadol Other (See Comments)    Confusion     Current Outpatient Medications  Medication Sig Dispense Refill  . amLODipine (NORVASC) 5 MG tablet Take 5  mg by mouth daily.    . cephALEXin (KEFLEX) 500 MG capsule Take 1 capsule (500 mg total) by mouth 3 (three) times daily. (Patient not taking: Reported on 02/13/2017) 15 capsule 0  . meloxicam (MOBIC) 15 MG tablet Take 1 tablet (15 mg total) by mouth daily. (Patient not taking: Reported on 02/13/2017) 6 tablet 0  . nitroGLYCERIN (NITROSTAT) 0.4 MG SL tablet Place 0.4 mg under the tongue every 5 (five) minutes as needed for chest pain.     Marland Kitchen oxyCODONE-acetaminophen (ROXICET) 5-325 MG tablet Take 1 tablet by mouth every 6 (six) hours as needed. 30 tablet 0   No current facility-administered medications for this visit.      Past Surgical History:  Procedure Laterality Date  . COLONOSCOPY    . COLONOSCOPY    . CORONARY ANGIOPLASTY  2010   Dr Johnsie Cancel  . CYSTOSCOPY WITH LITHOLAPAXY N/A 02/24/2017   Procedure: CYSTOSCOPY WITH LITHOLAPAXY;  Surgeon: Cleon Gustin, MD;  Location: [facility];  Service: Urology;  Laterality: N/A;  . CYSTOSCOPY WITH URETHRAL DILATATION  02/24/2017   Procedure: CYSTOSCOPY WITH URETHRAL DILATATION;  Surgeon: Cleon Gustin, MD;  Location: [facility];  Service: Urology;;  . Blenda Peals    .  EAR CYST EXCISION Left 07/23/2012   Procedure: EXCISION LEFT EAR LOBE;  Surgeon: Melissa Montane, MD;  Location: [facility];  Service: ENT;  Laterality: Left;  . HOLMIUM LASER APPLICATION N/A 05/21/3233   Procedure: HOLMIUM LASER APPLICATION OF BLADDER CALCULI;  Surgeon: Cleon Gustin, MD;  Location: [facility];  Service: Urology;  Laterality: N/A;  . IR RADIOLOGIST EVAL & MGMT  11/28/2016     Allergies  Allergen Reactions  . Crestor [Rosuvastatin] Other (See Comments)    Unknown   . Penicillins Other (See Comments)    Per patient causes gi upset with high doses. He says he does OK with shots but gets GI upset with PO penicillins Can take small doses with no issues  . Tramadol Other (See Comments)    Confusion      No family history on file.   Social History [name] reports that he quit smoking about 17 years ago. His smoking use included cigarettes. He has a 45.00 pack-year smoking history. he has never used smokeless tobacco. Angel [name] reports that he does not drink alcohol.   Review of Systems CONSTITUTIONAL: No weight loss, fever, chills, weakness or fatigue.  HEENT: Eyes: No visual loss, blurred vision, double vision or yellow sclerae.No hearing loss, sneezing, congestion, runny nose or sore throat.  SKIN: No rash or itching.  CARDIOVASCULAR:  RESPIRATORY: No shortness of breath, cough or sputum.  GASTROINTESTINAL: No anorexia, nausea, vomiting or diarrhea. No abdominal pain or blood.  GENITOURINARY: No burning on urination, no polyuria NEUROLOGICAL: No headache, dizziness, syncope, paralysis, ataxia, numbness or tingling in the extremities. No change in bowel or bladder control.  MUSCULOSKELETAL: No muscle, back pain, joint pain or stiffness.  LYMPHATICS: No enlarged nodes. No history of splenectomy.  PSYCHIATRIC: No history of depression or anxiety.  ENDOCRINOLOGIC: No reports of sweating, cold or heat intolerance. No polyuria or polydipsia.  Marland Kitchen   Physical Examination There were no vitals filed for this visit. There were no vitals filed for this visit.  Gen: resting comfortably, no acute distress HEENT: no scleral icterus, pupils equal round and reactive, no palptable cervical adenopathy,  CV Resp: Clear to auscultation bilaterally GI: abdomen is soft, non-tender, non-distended, normal bowel sounds, no hepatosplenomegaly MSK: extremities are warm, no edema.  Skin: warm, no rash Neuro:  no focal deficits Psych: appropriate affect   Diagnostic Studies  01/2017 echo Study Conclusions  - Left ventricle: The cavity size was normal. Wall thickness was   increased in a pattern of mild LVH. Systolic function was normal.   The estimated ejection fraction was in the range of 60% to 65%.   Wall motion was normal; there were no  regional wall motion   abnormalities. Doppler parameters are consistent with abnormal   left ventricular relaxation (grade 1 diastolic dysfunction). - Aortic valve: There was mild regurgitation. - Mitral valve: There was mild regurgitation. - Tricuspid valve: There was moderate regurgitation. - Pulmonary arteries: PA peak pressure: 38 mm Hg (S).   Assessment and Plan    1. Preoperative cardiac evaluation - patient being considered for cryoablation of renal mass under general anesthesia - he is able to tolerate well over 4 METs regularly without symptoms - given his CAD history and intermittent LE edema, would like to obtain echo prior to surgery. Pending results would anticipate proceeding with surgery  2. CAD - will need to restart ASA after upcoming surgery - will need to look into his statin history further - baselien EKG today  shows SR, no ischemic changes  3. Heart murmur  - obtain echo  4. LE edema - obtain echo    Arnoldo Lenis, M.D., F.A.C.C.

## 2017-05-24 DIAGNOSIS — C641 Malignant neoplasm of right kidney, except renal pelvis: Secondary | ICD-10-CM | POA: Insufficient documentation

## 2017-06-16 ENCOUNTER — Ambulatory Visit (INDEPENDENT_AMBULATORY_CARE_PROVIDER_SITE_OTHER): Payer: Medicare Other | Admitting: Cardiology

## 2017-06-16 ENCOUNTER — Encounter: Payer: Self-pay | Admitting: Cardiology

## 2017-06-16 VITALS — BP 158/76 | HR 61 | Ht 70.0 in | Wt 172.4 lb

## 2017-06-16 DIAGNOSIS — I10 Essential (primary) hypertension: Secondary | ICD-10-CM

## 2017-06-16 DIAGNOSIS — I2510 Atherosclerotic heart disease of native coronary artery without angina pectoris: Secondary | ICD-10-CM

## 2017-06-16 NOTE — Patient Instructions (Signed)
Your physician wants you to follow-up in: 1 YEAR WITH DR BRANCH  You will receive a reminder letter in the mail two months in advance. If you don't receive a letter, please call our office to schedule the follow-up appointment.  Your physician has recommended you make the following change in your medication:   START ASPIRIN 81 MG DAILY   Thank you for choosing [facility]!!    

## 2017-06-16 NOTE — Progress Notes (Signed)
Clinical Summary Angel [name] is a 77 y.o.male seen today for follow up of the following medical problems.    1. CAD - history of prior stent  in 9/ 2011 to RCA  - no recent chest pain, no recent SOB/DOE - compliant with meds. Not interested in statin. Previously had hematuria on ASA.   2. HTN - higher doses of norvasc caused some leg swelling and dizziness - compliant with meds     Past Medical History:  Diagnosis Date  . Bladder stones   . CAD (coronary artery disease)    BMS RCA 8/11; LVEF 50-55%  . Cancer ([place])   . Deep vein thrombosis (DVT) ([place])   . Gall bladder stones   . GERD (gastroesophageal reflux disease)   . Gout   . History of kidney stones   . HLD (hyperlipidemia)   . HTN (hypertension)   . MI (myocardial infarction) ([place])    MSTEMI 8/11  . Pneumonia 2013  . Rash, skin    on lower legs  . Renal disorder    kidney growth  . 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 Crestor [Rosuvastatin] Other (See Comments)    Unknown   . Penicillins Other (See Comments)    Per patient causes gi upset with high doses. He says he does OK with shots but gets GI upset with PO penicillins Can take small doses with no issues  . Tramadol Other (See Comments)    Confusion     Current Outpatient Medications  Medication Sig Dispense Refill  . amLODipine (NORVASC) 5 MG tablet Take 5 mg by mouth daily.    . cephALEXin (KEFLEX) 500 MG capsule Take 1 capsule (500 mg total) by mouth 3 (three) times daily. (Patient not taking: Reported on 02/13/2017) 15 capsule 0  . meloxicam (MOBIC) 15 MG tablet Take 1 tablet (15 mg total) by mouth daily. (Patient not taking: Reported on 02/13/2017) 6 tablet 0  . nitroGLYCERIN (NITROSTAT) 0.4 MG SL tablet Place 0.4 mg under the tongue every 5 (five) minutes as needed for chest pain.     Marland Kitchen oxyCODONE-acetaminophen (ROXICET) 5-325 MG  tablet Take 1 tablet by mouth every 6 (six) hours as needed. 30 tablet 0   No current facility-administered medications for this visit.      Past Surgical History:  Procedure Laterality Date  . COLONOSCOPY    . COLONOSCOPY    . CORONARY ANGIOPLASTY  2010   Dr Johnsie Cancel  . CYSTOSCOPY WITH LITHOLAPAXY N/A 02/24/2017   Procedure: CYSTOSCOPY WITH LITHOLAPAXY;  Surgeon: Cleon Gustin, MD;  Location: [facility];  Service: Urology;  Laterality: N/A;  . CYSTOSCOPY WITH URETHRAL DILATATION  02/24/2017   Procedure: CYSTOSCOPY WITH URETHRAL DILATATION;  Surgeon: Cleon Gustin, MD;  Location[facility];  Service: Urology;;  . Blenda Peals    . EAR CYST EXCISION Left 07/23/2012   Procedure: EXCISION LEFT EAR LOBE;  Surgeon: Melissa Montane, MD;  Location: [facility];  Service: ENT;  Laterality: Left;  . HOLMIUM LASER APPLICATION N/A 71/24/5809   Procedure: HOLMIUM LASER APPLICATION OF BLADDER CALCULI;  Surgeon: Cleon Gustin, MD;  Location: [facility];  Service: Urology;  Laterality: N/A;  . IR RADIOLOGIST EVAL & MGMT  11/28/2016     Allergies  Allergen Reactions  . Crestor [Rosuvastatin] Other (See Comments)  Unknown   . Penicillins Other (See Comments)    Per patient causes gi upset with high doses. He says he does OK with shots but gets GI upset with PO penicillins Can take small doses with no issues  . Tramadol Other (See Comments)    Confusion      No family history on file.   Social History Mr. Junio reports that he quit smoking about 18 years ago. His smoking use included cigarettes. He has a 45.00 pack-year smoking history. he has never used smokeless tobacco. Mr. Knisley reports that he does not drink alcohol.   Review of Systems CONSTITUTIONAL: No weight loss, fever, chills, weakness or fatigue.  HEENT: Eyes: No visual loss, blurred vision, double vision or yellow sclerae.No hearing loss, sneezing, congestion, runny nose or sore throat.  SKIN: No rash or itching.    CARDIOVASCULAR: per hpi RESPIRATORY: No shortness of breath, cough or sputum.  GASTROINTESTINAL: No anorexia, nausea, vomiting or diarrhea. No abdominal pain or blood.  GENITOURINARY: No burning on urination, no polyuria NEUROLOGICAL: No headache, dizziness, syncope, paralysis, ataxia, numbness or tingling in the extremities. No change in bowel or bladder control.  MUSCULOSKELETAL: No muscle, back pain, joint pain or stiffness.  LYMPHATICS: No enlarged nodes. No history of splenectomy.  PSYCHIATRIC: No history of depression or anxiety.  ENDOCRINOLOGIC: No reports of sweating, cold or heat intolerance. No polyuria or polydipsia.  Marland Kitchen   Physical Examination Vitals:   06/16/17 1119  BP: (!) 158/76  Pulse: 61  SpO2: 98%   Vitals:   06/16/17 1119  Weight: 172 lb 6.4 oz (78.2 kg)  Height: 5\' 10"  (1.778 m)    Gen: resting comfortably, no acute distress HEENT: no scleral icterus, pupils equal round and reactive, no palptable cervical adenopathy,  CV: RRR, no m/r/g, no jvd Resp: Clear to auscultation bilaterally GI: abdomen is soft, non-tender, non-distended, normal bowel sounds, no hepatosplenomegaly MSK: extremities are warm, no edema.  Skin: warm, no rash Neuro:  no focal deficits Psych: appropriate affect   Diagnostic Studies  01/2017 echo Study Conclusions  - Left ventricle: The cavity size was normal. Wall thickness was   increased in a pattern of mild LVH. Systolic function was normal.   The estimated ejection fraction was in the range of 60% to 65%.   Wall motion was normal; there were no regional wall motion   abnormalities. Doppler parameters are consistent with abnormal   left ventricular relaxation (grade 1 diastolic dysfunction). - Aortic valve: There was mild regurgitation. - Mitral valve: There was mild regurgitation. - Tricuspid valve: There was moderate regurgitation. - Pulmonary arteries: PA peak pressure: 38 mm Hg (S).   Assessment and Plan   1. CAD -  no recent symptoms - no recent hematuria, he will restart ASA 81mg  daily. He has not been interested in statin.   2. HTN - elevated in clinic. More aggressive bp control caused dizziness, continue current meds    Arnoldo Lenis, M.D.

## 2017-06-20 ENCOUNTER — Encounter: Payer: Self-pay | Admitting: Cardiology

## 2017-07-22 ENCOUNTER — Encounter (HOSPITAL_COMMUNITY): Payer: Self-pay | Admitting: *Deleted

## 2017-07-22 ENCOUNTER — Other Ambulatory Visit: Payer: Self-pay

## 2017-07-22 ENCOUNTER — Emergency Department (HOSPITAL_COMMUNITY)
Admission: EM | Admit: 2017-07-22 | Discharge: 2017-07-22 | Disposition: A | Discharge status: Home / Self Care | Payer: Medicare Other | Attending: Emergency Medicine | Admitting: Emergency Medicine

## 2017-07-22 DIAGNOSIS — R1030 Lower abdominal pain, unspecified: Secondary | ICD-10-CM | POA: Diagnosis not present

## 2017-07-22 DIAGNOSIS — R197 Diarrhea, unspecified: Secondary | ICD-10-CM | POA: Diagnosis present

## 2017-07-22 DIAGNOSIS — Z5321 Procedure and treatment not carried out due to patient leaving prior to being seen by health care provider: Secondary | ICD-10-CM | POA: Insufficient documentation

## 2017-07-22 DIAGNOSIS — R110 Nausea: Secondary | ICD-10-CM | POA: Diagnosis not present

## 2017-07-22 LAB — URINALYSIS, ROUTINE W REFLEX MICROSCOPIC
BILIRUBIN URINE: NEGATIVE
GLUCOSE, UA: NEGATIVE mg/dL
KETONES UR: NEGATIVE mg/dL
NITRITE: NEGATIVE
PROTEIN: 30 mg/dL — AB
Specific Gravity, Urine: 1.021 (ref 1.005–1.030)
pH: 5 (ref 5.0–8.0)

## 2017-07-22 LAB — CBC
HCT: 50.7 % (ref 39.0–52.0)
HEMOGLOBIN: 17 g/dL (ref 13.0–17.0)
MCH: 30.6 pg (ref 26.0–34.0)
MCHC: 33.5 g/dL (ref 30.0–36.0)
MCV: 91.4 fL (ref 78.0–100.0)
Platelets: 169 10*3/uL (ref 150–400)
RBC: 5.55 MIL/uL (ref 4.22–5.81)
RDW: 13.7 % (ref 11.5–15.5)
WBC: 8.8 10*3/uL (ref 4.0–10.5)

## 2017-07-22 LAB — COMPREHENSIVE METABOLIC PANEL
ALT: 10 U/L — ABNORMAL LOW (ref 17–63)
ANION GAP: 14 (ref 5–15)
AST: 18 U/L (ref 15–41)
Albumin: 4.3 g/dL (ref 3.5–5.0)
Alkaline Phosphatase: 66 U/L (ref 38–126)
BILIRUBIN TOTAL: 1.2 mg/dL (ref 0.3–1.2)
BUN: 16 mg/dL (ref 6–20)
CHLORIDE: 103 mmol/L (ref 101–111)
CO2: 22 mmol/L (ref 22–32)
Calcium: 9.2 mg/dL (ref 8.9–10.3)
Creatinine, Ser: 1.33 mg/dL — ABNORMAL HIGH (ref 0.61–1.24)
GFR calc Af Amer: 58 mL/min — ABNORMAL LOW (ref >60)
GFR, EST NON AFRICAN AMERICAN: 50 mL/min — AB (ref >60)
Glucose, Bld: 112 mg/dL — ABNORMAL HIGH (ref 65–99)
Potassium: 4.7 mmol/L (ref 3.5–5.1)
Sodium: 139 mmol/L (ref 135–145)
TOTAL PROTEIN: 8.3 g/dL — AB (ref 6.5–8.1)

## 2017-07-22 LAB — LIPASE, BLOOD: LIPASE: 29 U/L (ref 11–51)

## 2017-07-22 NOTE — ED Notes (Signed)
Called no answer

## 2017-07-22 NOTE — ED Triage Notes (Signed)
Pt c/o diarrhea x 10, lower abdominal cramping and slight nausea since he woke up at 0600 this morning. Denies fever and vomiting.

## 2017-07-23 ENCOUNTER — Other Ambulatory Visit: Payer: Self-pay

## 2017-07-23 ENCOUNTER — Encounter (HOSPITAL_COMMUNITY): Payer: Self-pay | Admitting: Emergency Medicine

## 2017-07-23 ENCOUNTER — Emergency Department (HOSPITAL_COMMUNITY): Payer: Medicare Other

## 2017-07-23 ENCOUNTER — Emergency Department (HOSPITAL_COMMUNITY)
Admission: EM | Admit: 2017-07-23 | Discharge: 2017-07-23 | Disposition: A | Discharge status: Home / Self Care | Payer: Medicare Other | Attending: Emergency Medicine | Admitting: Emergency Medicine

## 2017-07-23 DIAGNOSIS — Z85828 Personal history of other malignant neoplasm of skin: Secondary | ICD-10-CM | POA: Insufficient documentation

## 2017-07-23 DIAGNOSIS — K529 Noninfective gastroenteritis and colitis, unspecified: Secondary | ICD-10-CM

## 2017-07-23 DIAGNOSIS — N3001 Acute cystitis with hematuria: Secondary | ICD-10-CM | POA: Insufficient documentation

## 2017-07-23 DIAGNOSIS — K5792 Diverticulitis of intestine, part unspecified, without perforation or abscess without bleeding: Secondary | ICD-10-CM | POA: Diagnosis not present

## 2017-07-23 DIAGNOSIS — Z79899 Other long term (current) drug therapy: Secondary | ICD-10-CM | POA: Insufficient documentation

## 2017-07-23 DIAGNOSIS — N2889 Other specified disorders of kidney and ureter: Secondary | ICD-10-CM | POA: Diagnosis not present

## 2017-07-23 DIAGNOSIS — I2510 Atherosclerotic heart disease of native coronary artery without angina pectoris: Secondary | ICD-10-CM | POA: Diagnosis not present

## 2017-07-23 DIAGNOSIS — I10 Essential (primary) hypertension: Secondary | ICD-10-CM | POA: Insufficient documentation

## 2017-07-23 DIAGNOSIS — R197 Diarrhea, unspecified: Secondary | ICD-10-CM

## 2017-07-23 DIAGNOSIS — Z87891 Personal history of nicotine dependence: Secondary | ICD-10-CM | POA: Insufficient documentation

## 2017-07-23 DIAGNOSIS — Z7982 Long term (current) use of aspirin: Secondary | ICD-10-CM | POA: Diagnosis not present

## 2017-07-23 LAB — COMPREHENSIVE METABOLIC PANEL
ALBUMIN: 4.1 g/dL (ref 3.5–5.0)
ALT: 60 U/L (ref 17–63)
ANION GAP: 10 (ref 5–15)
AST: 65 U/L — ABNORMAL HIGH (ref 15–41)
Alkaline Phosphatase: 68 U/L (ref 38–126)
BILIRUBIN TOTAL: 1.4 mg/dL — AB (ref 0.3–1.2)
BUN: 19 mg/dL (ref 6–20)
CALCIUM: 9 mg/dL (ref 8.9–10.3)
CO2: 23 mmol/L (ref 22–32)
Chloride: 106 mmol/L (ref 101–111)
Creatinine, Ser: 1.43 mg/dL — ABNORMAL HIGH (ref 0.61–1.24)
GFR calc Af Amer: 53 mL/min — ABNORMAL LOW (ref >60)
GFR, EST NON AFRICAN AMERICAN: 46 mL/min — AB (ref >60)
Glucose, Bld: 102 mg/dL — ABNORMAL HIGH (ref 65–99)
POTASSIUM: 3.9 mmol/L (ref 3.5–5.1)
Sodium: 139 mmol/L (ref 135–145)
TOTAL PROTEIN: 7.9 g/dL (ref 6.5–8.1)

## 2017-07-23 LAB — URINALYSIS, ROUTINE W REFLEX MICROSCOPIC
Bilirubin Urine: NEGATIVE
Glucose, UA: NEGATIVE mg/dL
Ketones, ur: NEGATIVE mg/dL
Nitrite: NEGATIVE
Protein, ur: 100 mg/dL — AB
Specific Gravity, Urine: 1.02 (ref 1.005–1.030)
Squamous Epithelial / HPF: NONE SEEN
pH: 5 (ref 5.0–8.0)

## 2017-07-23 LAB — CBC
HCT: 46.6 % (ref 39.0–52.0)
Hemoglobin: 15.5 g/dL (ref 13.0–17.0)
MCH: 30 pg (ref 26.0–34.0)
MCHC: 33.3 g/dL (ref 30.0–36.0)
MCV: 90.1 fL (ref 78.0–100.0)
Platelets: 144 K/uL — ABNORMAL LOW (ref 150–400)
RBC: 5.17 MIL/uL (ref 4.22–5.81)
RDW: 14 % (ref 11.5–15.5)
WBC: 7.6 K/uL (ref 4.0–10.5)

## 2017-07-23 LAB — LIPASE, BLOOD: Lipase: 33 U/L (ref 11–51)

## 2017-07-23 MED ORDER — CIPROFLOXACIN HCL 500 MG PO TABS: 500.0000 mg | ORAL_TABLET | Freq: Two times a day (BID) | ORAL | 0 refills | Status: DC

## 2017-07-23 MED ORDER — SODIUM CHLORIDE 0.9 % IV BOLUS (SEPSIS)
500.0000 mL | Freq: Once | INTRAVENOUS | Status: AC
  Administered 2017-07-23: 500 mL via INTRAVENOUS

## 2017-07-23 MED ORDER — METRONIDAZOLE 500 MG PO TABS: 500.0000 mg | ORAL_TABLET | Freq: Three times a day (TID) | ORAL | 0 refills | Status: AC

## 2017-07-23 MED ORDER — LOPERAMIDE HCL 2 MG PO TABS: 2.0000 mg | ORAL_TABLET | Freq: Four times a day (QID) | ORAL | 0 refills | Status: DC | PRN

## 2017-07-23 MED ORDER — METRONIDAZOLE 500 MG PO TABS
500.0000 mg | ORAL_TABLET | Freq: Once | ORAL | Status: AC
  Administered 2017-07-23: 500 mg via ORAL
  Filled 2017-07-23: qty 1

## 2017-07-23 MED ORDER — IOPAMIDOL (ISOVUE-300) INJECTION 61%
75.0000 mL | Freq: Once | INTRAVENOUS | Status: AC | PRN
  Administered 2017-07-23: 75 mL via INTRAVENOUS

## 2017-07-23 MED ORDER — SODIUM CHLORIDE 0.9 % IV SOLN
INTRAVENOUS | Status: DC
  Administered 2017-07-23: 21:00:00 via INTRAVENOUS

## 2017-07-23 MED ORDER — ONDANSETRON 4 MG PO TBDP: 4.0000 mg | ORAL_TABLET | Freq: Three times a day (TID) | ORAL | 1 refills | Status: DC | PRN

## 2017-07-23 MED ORDER — CIPROFLOXACIN HCL 250 MG PO TABS
500.0000 mg | ORAL_TABLET | Freq: Once | ORAL | Status: AC
  Administered 2017-07-23: 500 mg via ORAL
  Filled 2017-07-23: qty 2

## 2017-07-23 MED ORDER — ONDANSETRON HCL 4 MG/2ML IJ SOLN
4.0000 mg | Freq: Once | INTRAMUSCULAR | Status: AC
  Administered 2017-07-23: 4 mg via INTRAVENOUS
  Filled 2017-07-23: qty 2

## 2017-07-23 NOTE — ED Notes (Signed)
Pt informed of need for urine sample. Pt states he cannot provide one at this time but was given a urinal and states he will provide one as soon as able.

## 2017-07-23 NOTE — Discharge Instructions (Signed)
Take the antibiotics as directed for the probable diverticulitis and for the possible urinary tract infection.  Take both the Cipro and Flagyl.  Make an appointment to follow-up with your doctor.  CT scan also found a mass on the right kidney that is concerning and will require additional follow-up.  You can start with your primary care doctor.  Would make an appointment sometime in the next few days.  Return for any new or worse symptoms.  Return if not improving over the next few days.  Take the Imodium as needed for the diarrhea.  Take the Zofran as needed for the nausea and vomiting.

## 2017-07-23 NOTE — ED Notes (Signed)
07/23/2017, 16:34.  Follow-up call completed.  Pt. Verbalized that he is going back to the ED for evaluation.

## 2017-07-23 NOTE — ED Provider Notes (Signed)
[facility] EMERGENCY DEPARTMENT Provider Note   CSN: [id number] Arrival date & time: 07/23/17  1758     History   Chief Complaint Chief Complaint  Patient presents with  . Diarrhea    HPI Angel [name] is a 77 y.o. male.  Patient presents with the onset of a diarrhea type illness starting about 6 in the morning on Monday.  Diarrhea all day.  Things were little bit better.  But this morning had recurrence of the diarrhea.  No blood did have one episode of vomiting.  Associated with some lower quadrant abdominal pain.  No fevers.  No sick contacts.  No blood in the bowel movements.      Past Medical History:  Diagnosis Date  . Bladder stones   . CAD (coronary artery disease)    BMS RCA 8/11; LVEF 50-55%  . Cancer ([place])   . Deep vein thrombosis (DVT) ([place])   . Gall bladder stones   . GERD (gastroesophageal reflux disease)   . Gout   . History of kidney stones   . HLD (hyperlipidemia)   . HTN (hypertension)   . MI (myocardial infarction) ([place])    MSTEMI 8/11  . Pneumonia 2013  . Rash, skin    on lower legs  . Renal disorder    kidney growth  . 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 BPH (benign prostatic hyperplasia) 02/24/2017  . ABDOMINAL PAIN-GENERALIZED 02/16/2010  . ESSENTIAL HYPERTENSION, BENIGN 02/02/2010  . ELEVATED BLOOD PRESSURE 01/31/2010  . MIXED HYPERLIPIDEMIA 01/18/2010  . SMOKER 01/18/2010  . CORONARY ATHEROSCLEROSIS NATIVE CORONARY ARTERY 01/18/2010  . FATIGUE 01/18/2010    Past Surgical History:  Procedure Laterality Date  . COLONOSCOPY    . COLONOSCOPY    . CORONARY ANGIOPLASTY  2010   Dr Johnsie Cancel  . CYSTOSCOPY WITH LITHOLAPAXY N/A 02/24/2017   Procedure: CYSTOSCOPY WITH LITHOLAPAXY;  Surgeon: Cleon Gustin, MD;  Location: [facility];  Service: Urology;  Laterality: N/A;  . CYSTOSCOPY WITH URETHRAL DILATATION   02/24/2017   Procedure: CYSTOSCOPY WITH URETHRAL DILATATION;  Surgeon: Cleon Gustin, MD;  Location: [facility];  Service: Urology;;  . Blenda Peals    . EAR CYST EXCISION Left 07/23/2012   Procedure: EXCISION LEFT EAR LOBE;  Surgeon: Melissa Montane, MD;  Location: [place];  Service: ENT;  Laterality: Left;  . HOLMIUM LASER APPLICATION N/A 85/27/7824   Procedure: HOLMIUM LASER APPLICATION OF BLADDER CALCULI;  Surgeon: Cleon Gustin, MD;  Location: [facility];  Service: Urology;  Laterality: N/A;  . IR RADIOLOGIST EVAL & MGMT  11/28/2016       Home Medications    Prior to Admission medications   Medication Sig Start Date End Date Taking? Authorizing Provider  amLODipine (NORVASC) 5 MG tablet Take 5 mg by mouth daily.   Yes [provider]  aspirin EC 81 MG tablet Take 81 mg by mouth daily.   Yes [provider]  nitroGLYCERIN (NITROSTAT) 0.4 MG SL tablet Place 0.4 mg under the tongue every 5 (five) minutes as needed for chest pain.  01/21/17  Yes [provider]  ciprofloxacin (CIPRO) 500 MG tablet Take 1 tablet (500 mg total) by mouth 2 (two) times daily. 07/23/17   Fredia Sorrow, MD  loperamide (IMODIUM A-D) 2 MG tablet Take 1 tablet (  2 mg total) by mouth 4 (four) times daily as needed for diarrhea or loose stools. 07/23/17   Fredia Sorrow, MD  metroNIDAZOLE (FLAGYL) 500 MG tablet Take 1 tablet (500 mg total) by mouth 3 (three) times daily for 7 days. 07/23/17 07/30/17  Fredia Sorrow, MD  ondansetron (ZOFRAN ODT) 4 MG disintegrating tablet Take 1 tablet (4 mg total) by mouth every 8 (eight) hours as needed. 07/23/17   Fredia Sorrow, MD    Family History History reviewed. No pertinent family history.  Social History Social History   Tobacco Use  . Smoking status: Former Smoker    Packs/day: 1.50    Years: 30.00    Pack years: 45.00    Types: Cigarettes    Last attempt to quit: 05/14/1999    Years since quitting: 18.2  . Smokeless tobacco:  Never Used  . Tobacco comment: quit 9/11  Substance Use Topics  . Alcohol use: No  . Drug use: No     Allergies   Crestor [rosuvastatin]; Penicillins; and Tramadol   Review of Systems Review of Systems  Constitutional: Negative for fever.  HENT: Negative for congestion.   Eyes: Negative for redness.  Respiratory: Negative for shortness of breath.   Cardiovascular: Negative for chest pain.  Gastrointestinal: Positive for abdominal pain, diarrhea, nausea and vomiting. Negative for blood in stool.  Genitourinary: Positive for difficulty urinating.  Musculoskeletal: Negative for back pain.  Skin: Negative for rash.  Neurological: Negative for headaches.  Hematological: Does not bruise/bleed easily.  Psychiatric/Behavioral: Negative for confusion.     Physical Exam Updated Vital Signs BP (!) 147/58   Pulse 84   Temp 97.6 F (36.4 C) (Oral)   Resp 16   SpO2 96%   Physical Exam  Constitutional: He is oriented to person, place, and time. He appears well-developed and well-nourished. No distress.  HENT:  Head: Normocephalic and atraumatic.  Mucous membranes slightly dry.  Eyes: EOM are normal. Pupils are equal, round, and reactive to light.  Neck: Neck supple.  Cardiovascular: Normal rate, regular rhythm and normal heart sounds.  Pulmonary/Chest: Effort normal and breath sounds normal. No respiratory distress.  Abdominal: Soft. Bowel sounds are normal. He exhibits distension. There is no tenderness.  Musculoskeletal: Normal range of motion. He exhibits no edema.  Neurological: He is alert and oriented to person, place, and time. No cranial nerve deficit or sensory deficit. He exhibits normal muscle tone. Coordination normal.  Skin: Skin is warm. No rash noted.  Nursing note and vitals reviewed.    ED Treatments / Results  Labs (all labs ordered are listed, but only abnormal results are displayed) Labs Reviewed  COMPREHENSIVE METABOLIC PANEL - Abnormal; Notable for  the following components:      Result Value   Glucose, Bld 102 (*)    Creatinine, Ser 1.43 (*)    AST 65 (*)    Total Bilirubin 1.4 (*)    GFR calc non Af Amer 46 (*)    GFR calc Af Amer 53 (*)    All other components within normal limits  CBC - Abnormal; Notable for the following components:   Platelets 144 (*)    All other components within normal limits  URINALYSIS, ROUTINE W REFLEX MICROSCOPIC - Abnormal; Notable for the following components:   APPearance CLOUDY (*)    Hgb urine dipstick MODERATE (*)    Protein, ur 100 (*)    Leukocytes, UA LARGE (*)    Bacteria, UA RARE (*)    All  other components within normal limits  LIPASE, BLOOD    EKG  EKG Interpretation None       Radiology Ct Abdomen Pelvis W Contrast  Result Date: 07/23/2017 CLINICAL DATA:  77 year old male with abdominal pain. Concern for diverticulitis. EXAM: CT ABDOMEN AND PELVIS WITH CONTRAST TECHNIQUE: Multidetector CT imaging of the abdomen and pelvis was performed using the standard protocol following bolus administration of intravenous contrast. CONTRAST:  11mL ISOVUE-300 IOPAMIDOL (ISOVUE-300) INJECTION 61% COMPARISON:  Abdominal CT dated 11/01/2016 FINDINGS: Lower chest: Multiple scattered pulmonary nodules in the visualized lung bases similar to prior CT. Chest radiograph may provide complete evaluation of the lungs. There is coronary vascular calcification. No intra-abdominal free air or free fluid. Hepatobiliary: Small scattered hepatic hypodense lesions similar to prior CT, incompletely characterized. The larger lesions demonstrate fluid attenuation most consistent with cysts and the smaller lesions are too small to characterize. No intrahepatic biliary ductal dilatation. The gallbladder is filled with stones. No pericholecystic fluid or evidence of acute inflammation by CT. Pancreas: Unremarkable. No pancreatic ductal dilatation or surrounding inflammatory changes. Spleen: Mildly enlarged spleen measuring  up to 14 cm in greatest length. Adrenals/Urinary Tract: The right adrenal gland is unremarkable. There is slight nodularity of the lateral limb of the left adrenal gland similar to prior CT, indeterminate, possibly adenoma. There is a 3.7 x 3.8 x 4.3 cm heterogeneously enhancing mass arising from the inferior pole of the right kidney which is grossly similar in size or slightly increased since the prior CT. This mass is not well characterized but concerning for a neoplastic process. Further characterization with MRI or tissue sampling, if not previously performed, recommended. There is no hydronephrosis on either side. There is symmetric enhancement and excretion of contrast by both kidneys. The visualized ureters appear unremarkable. There is diffuse thickened appearance of the bladder wall likely secondary to trabeculation and chronic bladder outlet obstruction. Correlation with urinalysis recommended to exclude cystitis. There is a 4 mm calcific focus along the left posterior bladder wall likely a stone. Stomach/Bowel: There is sigmoid diverticulosis. Mild thickened appearance of the sigmoid colon may be chronic or represent mild acute diverticulitis. Clinical correlation is recommended. Loose stool noted throughout the colon compatible with diarrheal state. Mildly thickened appearance of the small bowel loops may be related to underdistention or represent enteritis. There is no bowel obstruction. The appendix is not visualized, likely surgically absent. Vascular/Lymphatic: There is moderate calcified and noncalcified plaque along the aorta. 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 The origins of the celiac axis, SMA, IMA and the renal arteries are patent. The SMV, splenic vein, and main portal vein are patent. No portal venous gas. There is no adenopathy. Reproductive: The prostate gland is enlarged and heterogeneous measuring approximately 6 cm in transverse axial diameter. Other: None Musculoskeletal:  Degenerative changes of the spine. No acute osseous pathology. IMPRESSION: 1. Diarrheal state with findings of possible enteritis. Clinical correlation is recommended. No bowel obstruction. 2. Sigmoid diverticulosis. Mild thickened appearance of the sigmoid colon possibly related to underdistention. Mild acute diverticulitis is not entirely excluded. 3. Cholelithiasis. 4. Heterogeneously enhancing, partially exophytic right renal mass concerning for neoplasm. Further evaluation with MRI or tissue sampling, if not previously performed, recommended. 5.  Aortic Atherosclerosis (ICD10-I70.0). 6. 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 Ectatic abdominal aorta at risk for aneurysm development. Recommend followup by ultrasound in 5 years. This recommendation follows ACR consensus guidelines: White Paper of the ACR Incidental Findings Committee II on Vascular Findings. J Am Coll  Radiol 2013; 28:366-294. Electronically Signed   By: Anner Crete M.D.   On: 07/23/2017 21:43    Procedures Procedures (including critical care time)  Medications Ordered in ED Medications  0.9 %  sodium chloride infusion ( Intravenous Stopped 07/23/17 2156)  ciprofloxacin (CIPRO) tablet 500 mg (not administered)  metroNIDAZOLE (FLAGYL) tablet 500 mg (not administered)  sodium chloride 0.9 % bolus 500 mL (0 mLs Intravenous Stopped 07/23/17 2059)  ondansetron (ZOFRAN) injection 4 mg (4 mg Intravenous Given 07/23/17 2032)  iopamidol (ISOVUE-300) 61 % injection 75 mL (75 mLs Intravenous Contrast Given 07/23/17 2104)     Initial Impression / Assessment and Plan / ED Course  I have reviewed the triage vital signs and the nursing notes.  Pertinent labs & imaging results that were available during my care of the patient were reviewed by me and considered in my medical decision making (see chart for details).    Patient CT scan of abdomen shows enteritis which goes along with the diarrhea illness.  Labs without significant abnormalities  other than.  CT scan also raises concerns for diverticulitis.  Patient does have some lower quadrant abdominal tenderness.  Urinalysis also raises concern for urinary tract infection but both white blood cells and red blood cells but nitrite is negative.  CT scan all showed showed concern for right renal mass.  Patient states that his urologist is aware of this.  Has been present in the past.  Patient will be treated with oral Cipro and Flagyl here for the diverticulitis.  For the diarrhea will be treated with Imodium and Zofran as needed.  He will follow-up with his regular doctor and his urologist.  The antibiotics for the diverticulitis should also help treat the concern for the urinary tract infection.  Urine culture was sent.  Patient will return for any new or worse symptoms.  He will follow-up with his doctors.  Patient overall feels better.   Final Clinical Impressions(s) / ED Diagnoses   Final diagnoses:  Diverticulitis  Enteritis  Diarrhea, unspecified type  Acute cystitis with hematuria  Renal mass, right    ED Discharge Orders        Ordered    ciprofloxacin (CIPRO) 500 MG tablet  2 times daily     07/23/17 2158    metroNIDAZOLE (FLAGYL) 500 MG tablet  3 times daily     07/23/17 2158    loperamide (IMODIUM A-D) 2 MG tablet  4 times daily PRN     07/23/17 2158    ondansetron (ZOFRAN ODT) 4 MG disintegrating tablet  Every 8 hours PRN     07/23/17 2158       Fredia Sorrow, MD 07/23/17 2219

## 2017-07-23 NOTE — ED Triage Notes (Signed)
Pt c/o D/ since Sunday, today has had 4 episodes of D/ today and large amount of gas. States dysuria.

## 2017-07-30 ENCOUNTER — Ambulatory Visit (INDEPENDENT_AMBULATORY_CARE_PROVIDER_SITE_OTHER): Payer: Medicare Other | Admitting: Urology

## 2017-07-30 DIAGNOSIS — N401 Enlarged prostate with lower urinary tract symptoms: Secondary | ICD-10-CM

## 2017-07-30 DIAGNOSIS — C641 Malignant neoplasm of right kidney, except renal pelvis: Secondary | ICD-10-CM

## 2017-08-10 IMAGING — CT CT CHEST W/O CM
2 of 3 series · 15 of 36 positions shown, 18 images · non-contrast
Comparison: Chest radiographs 07/21/2012.  Abdominal CT 03/08/2016.

CLINICAL DATA: Enhancing right renal mass worrisome for renal cell
carcinoma. Pulmonary nodules at both lung bases on abdominal CT.

EXAM:
CT CHEST WITHOUT CONTRAST
TECHNIQUE: Multidetector CT imaging of the chest was performed following the
standard protocol without IV contrast.

[Series 2: thorax · axial · 0.75mm/px · z∈[-221,+87]mm · 12 of 182 slices shown, 15 images]
[im 14/182  mediastinal]
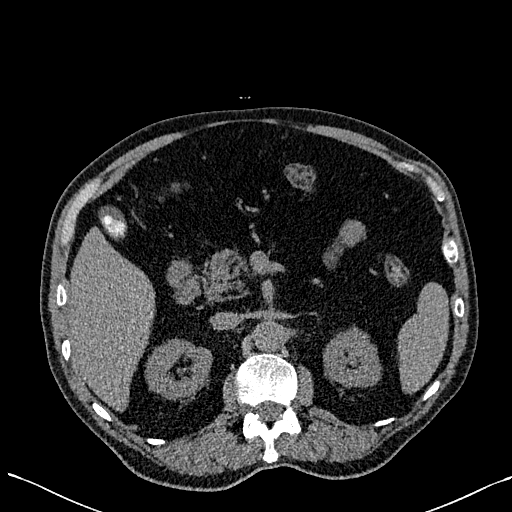
[im 14/182  lung]
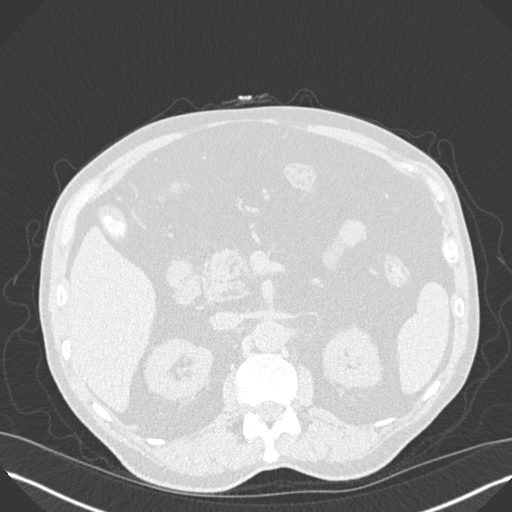
[im 27/182  lung]
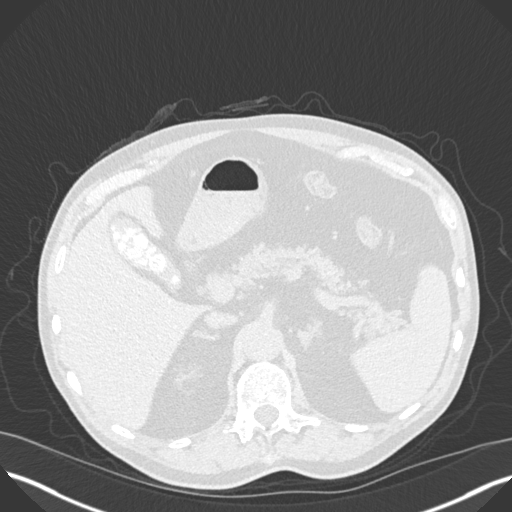
[im 41/182  lung]
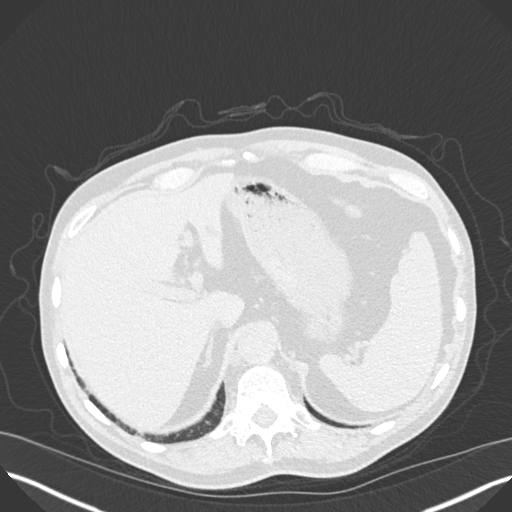
[im 54/182  lung]
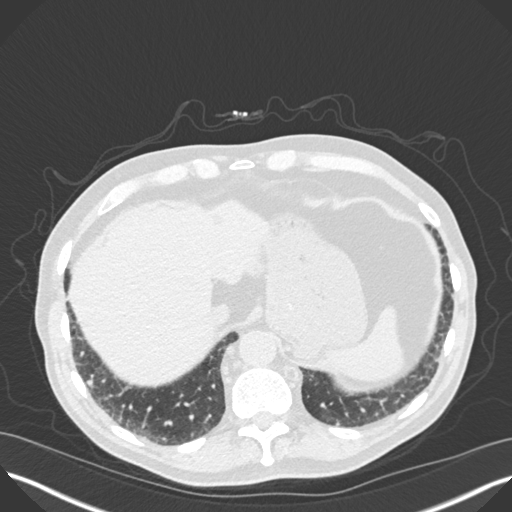
[im 68/182  mediastinal]
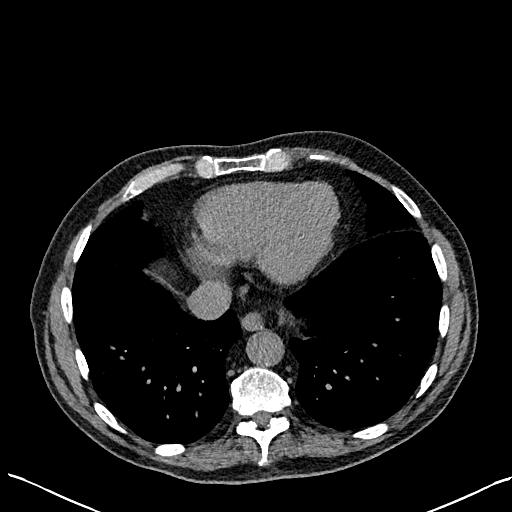
[im 68/182  lung]
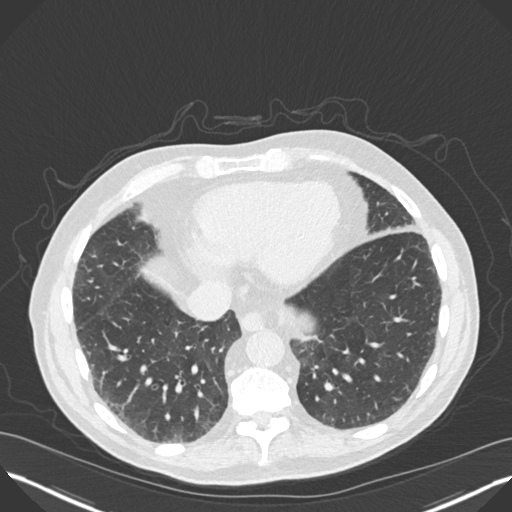
[im 81/182  lung]
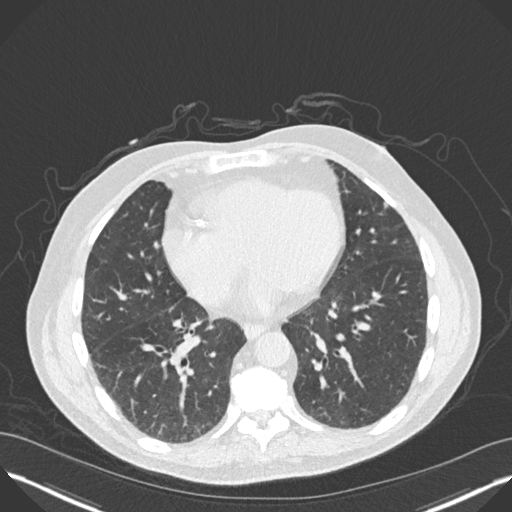
[im 101/182  lung]
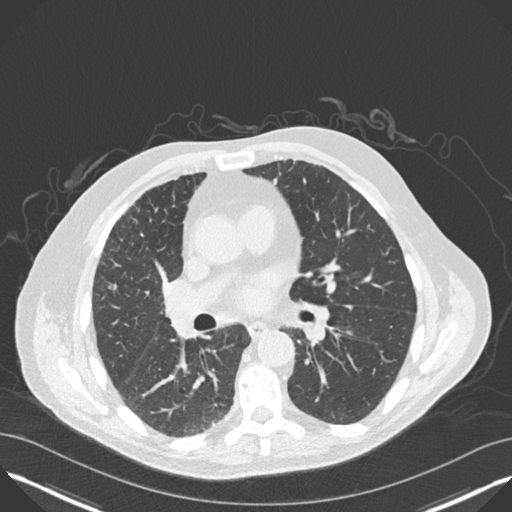
[im 114/182  lung]
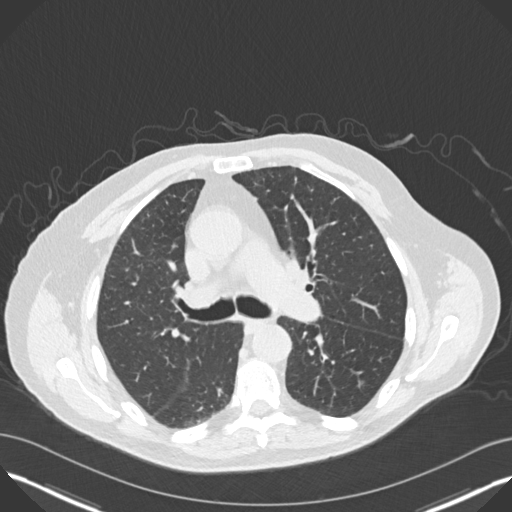
[im 128/182  mediastinal]
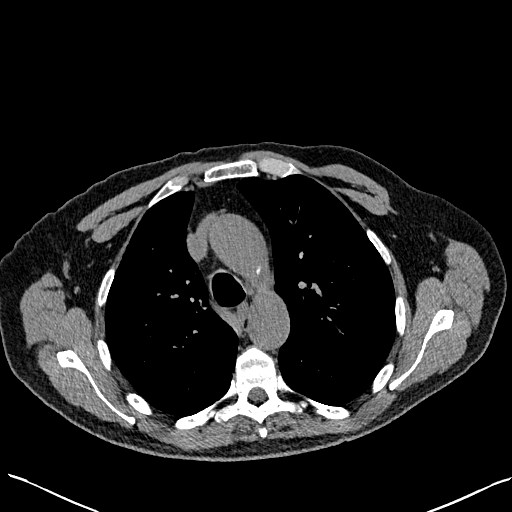
[im 128/182  lung]
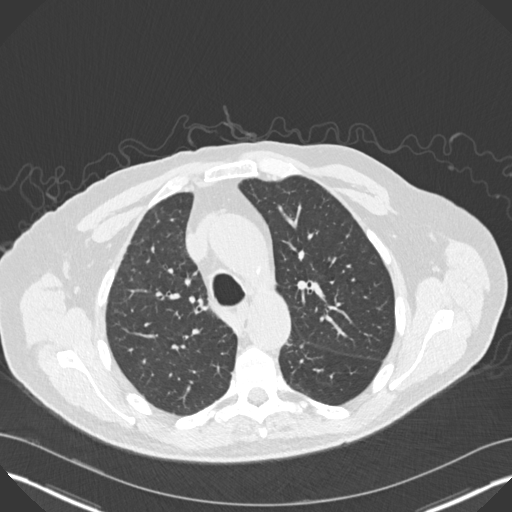
[im 141/182  lung]
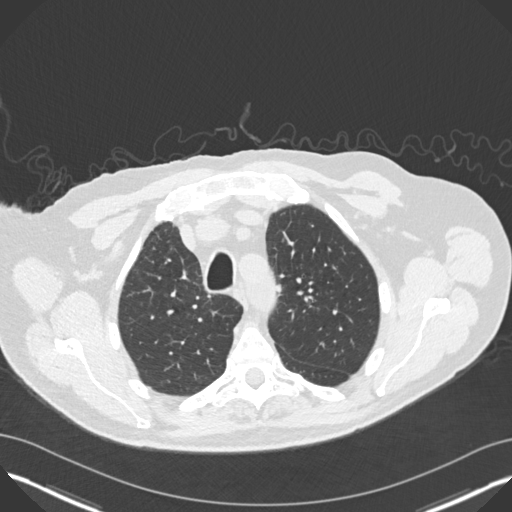
[im 155/182  lung]
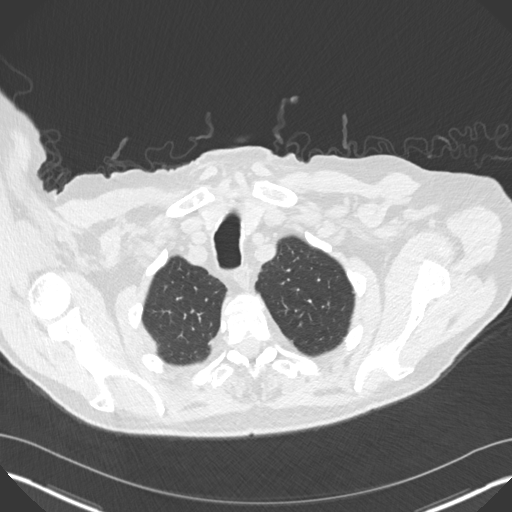
[im 168/182  lung]
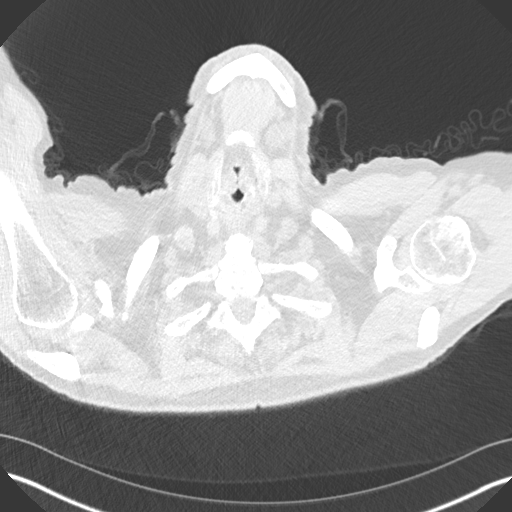

[Series 5: coronal · coronal · 0.71mm/px · 3 of 139 slices shown]
[im 28/139  lung]
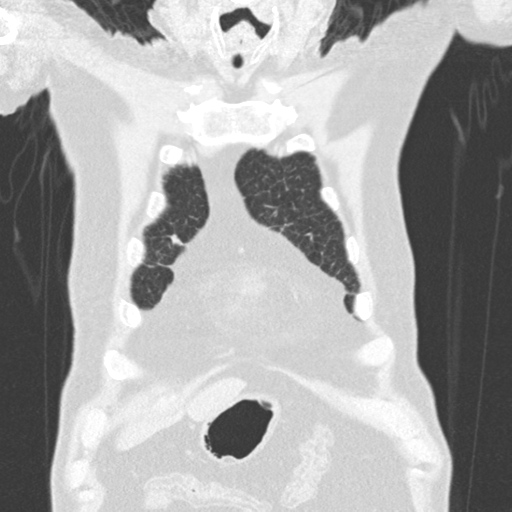
[im 56/139  lung]
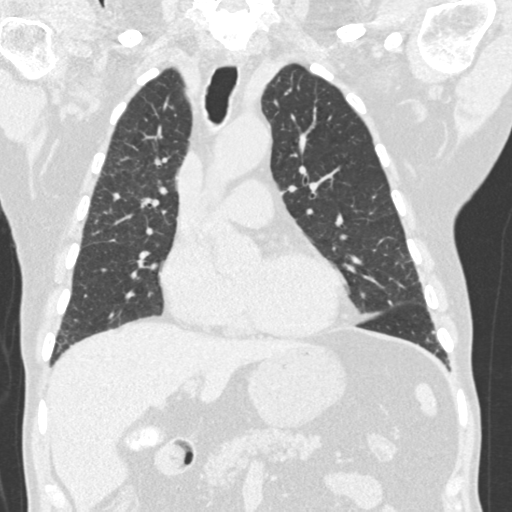
[im 83/139  lung]
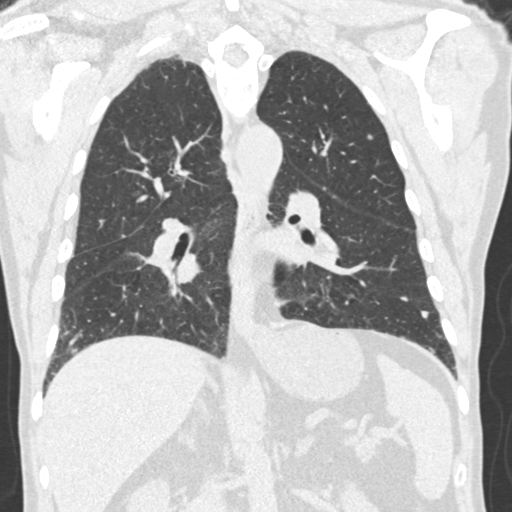

[15 of 36 positions shown; findings below may reference images not displayed]

FINDINGS: Cardiovascular: Atherosclerosis of the aorta, great vessels and
coronary arteries. No acute vascular findings on noncontrast
imaging. The heart size is normal. There is no pericardial effusion.

Mediastinum/Nodes: There are no enlarged mediastinal, hilar or
axillary lymph nodes.Hilar assessment is limited by the lack of
intravenous contrast. The thyroid gland and trachea demonstrate no
significant findings. There is a small hiatal hernia.

Lungs/Pleura: There is no pleural effusion. Multiple pulmonary
nodules are again noted at both lung bases, measuring up to 6 mm in
the right middle lobe on image 112 and 9 x 5 mm in the left lower
lobe on image 112. There are several other small nodules in the
upper chest bilaterally. No dominant lung mass demonstrated.

Upper abdomen: The visualized upper abdomen appears stable without
suspicious findings. There are probable hepatic cysts and multiple
calcified gallstones. The right renal mass is not imaged on this
examination.

Musculoskeletal/Chest wall: There is no chest wall mass or
suspicious osseous finding.
IMPRESSION: 1. Multiple bilateral pulmonary nodules worrisome for metastatic
disease given the enhancing right renal mass on recent abdominal CT.
2. No thoracic adenopathy or suspicious osseous findings.
3. Atherosclerosis and cholelithiasis.

## 2018-01-20 ENCOUNTER — Other Ambulatory Visit: Payer: Self-pay | Admitting: Urology

## 2018-01-20 DIAGNOSIS — C641 Malignant neoplasm of right kidney, except renal pelvis: Secondary | ICD-10-CM

## 2018-01-28 ENCOUNTER — Ambulatory Visit: Payer: Medicare Other | Admitting: Urology

## 2018-02-03 ENCOUNTER — Ambulatory Visit (HOSPITAL_COMMUNITY)
Admission: RE | Admit: 2018-02-03 | Discharge: 2018-02-03 | Disposition: A | Discharge status: Home / Self Care | Payer: Medicare Other | Source: Ambulatory Visit | Attending: Urology | Admitting: Urology

## 2018-02-03 DIAGNOSIS — C641 Malignant neoplasm of right kidney, except renal pelvis: Secondary | ICD-10-CM | POA: Diagnosis present

## 2018-02-18 ENCOUNTER — Ambulatory Visit (INDEPENDENT_AMBULATORY_CARE_PROVIDER_SITE_OTHER): Payer: Medicare Other | Admitting: Urology

## 2018-02-18 DIAGNOSIS — C641 Malignant neoplasm of right kidney, except renal pelvis: Secondary | ICD-10-CM | POA: Diagnosis not present

## 2018-02-18 DIAGNOSIS — N401 Enlarged prostate with lower urinary tract symptoms: Secondary | ICD-10-CM

## 2018-04-05 DIAGNOSIS — N2889 Other specified disorders of kidney and ureter: Secondary | ICD-10-CM | POA: Insufficient documentation

## 2018-06-08 IMAGING — DX DG ELBOW COMPLETE 3+V*R*
4 series · 4 of 4 positions shown · non-contrast
Comparison: None.

CLINICAL DATA: Right elbow pain after yard work.

EXAM:
RIGHT ELBOW - COMPLETE 3+ VIEW

[elbow ap (1 of 2)]
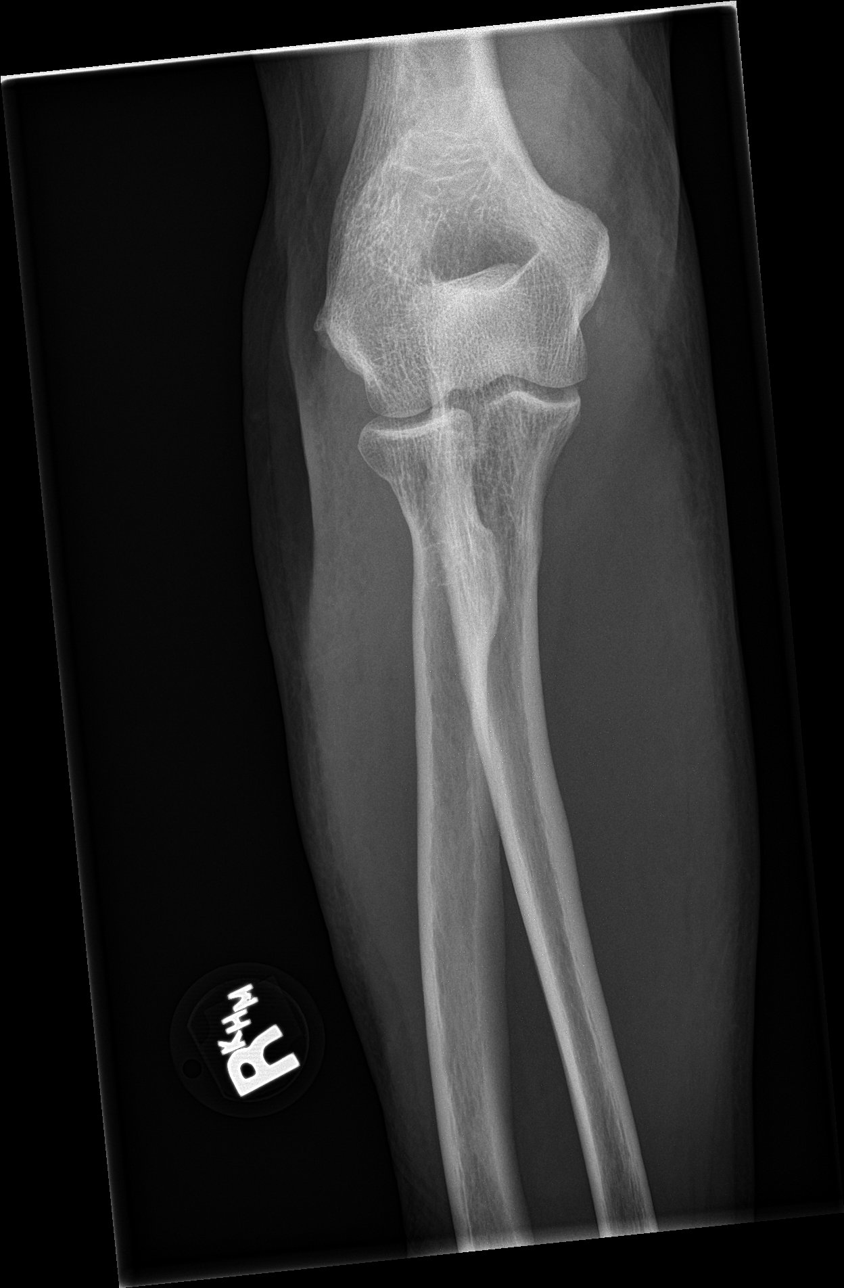

[elbow lat]
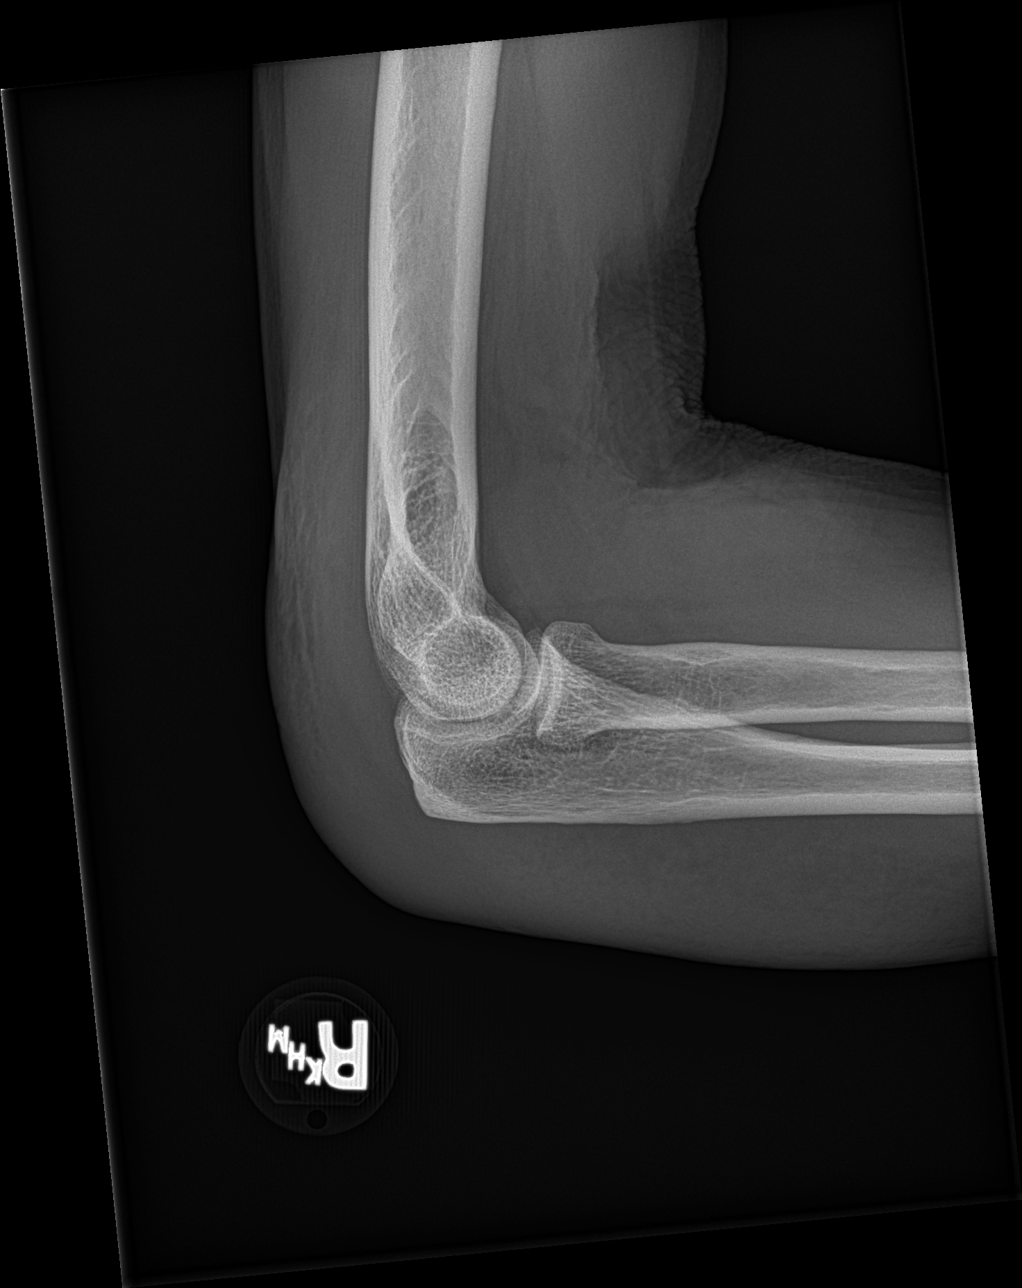

[elbow obl]
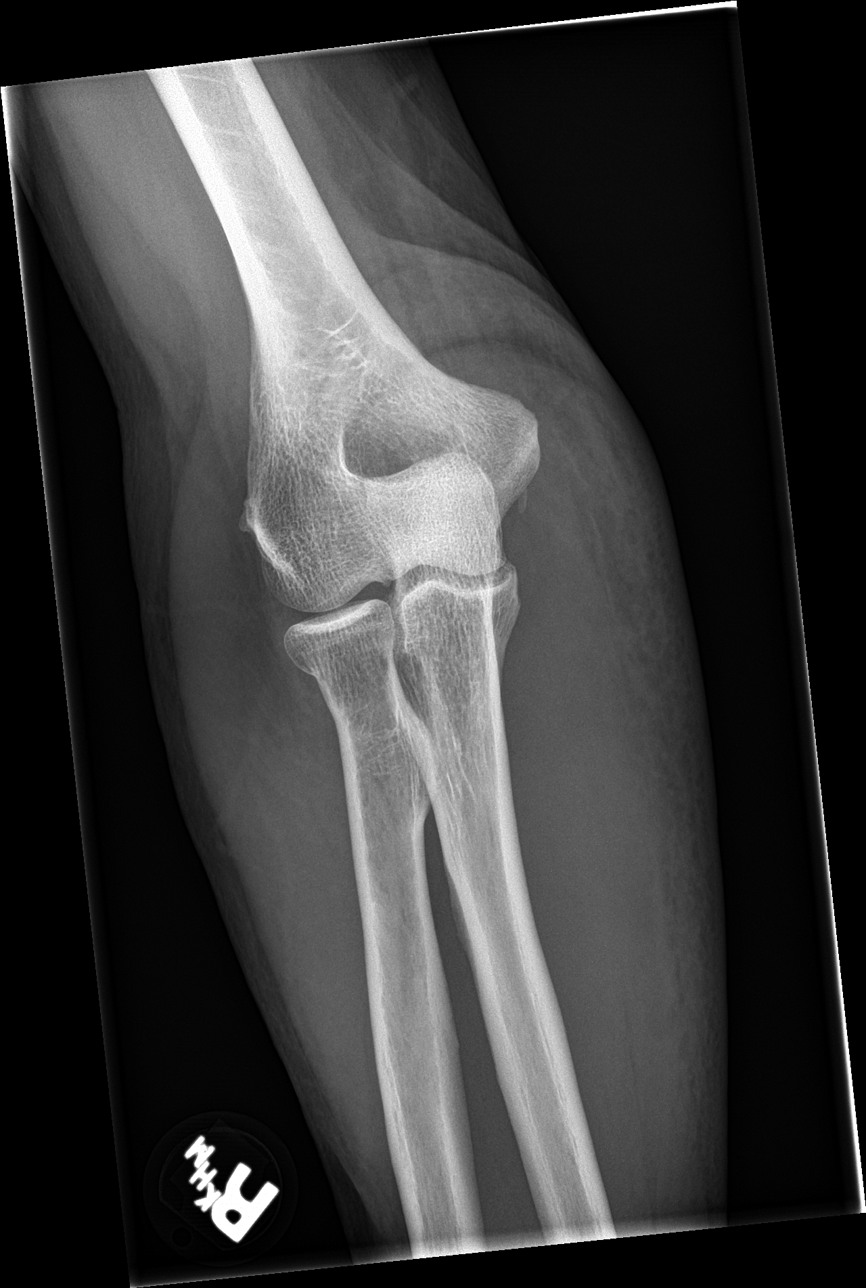

[elbow ap (2 of 2)]
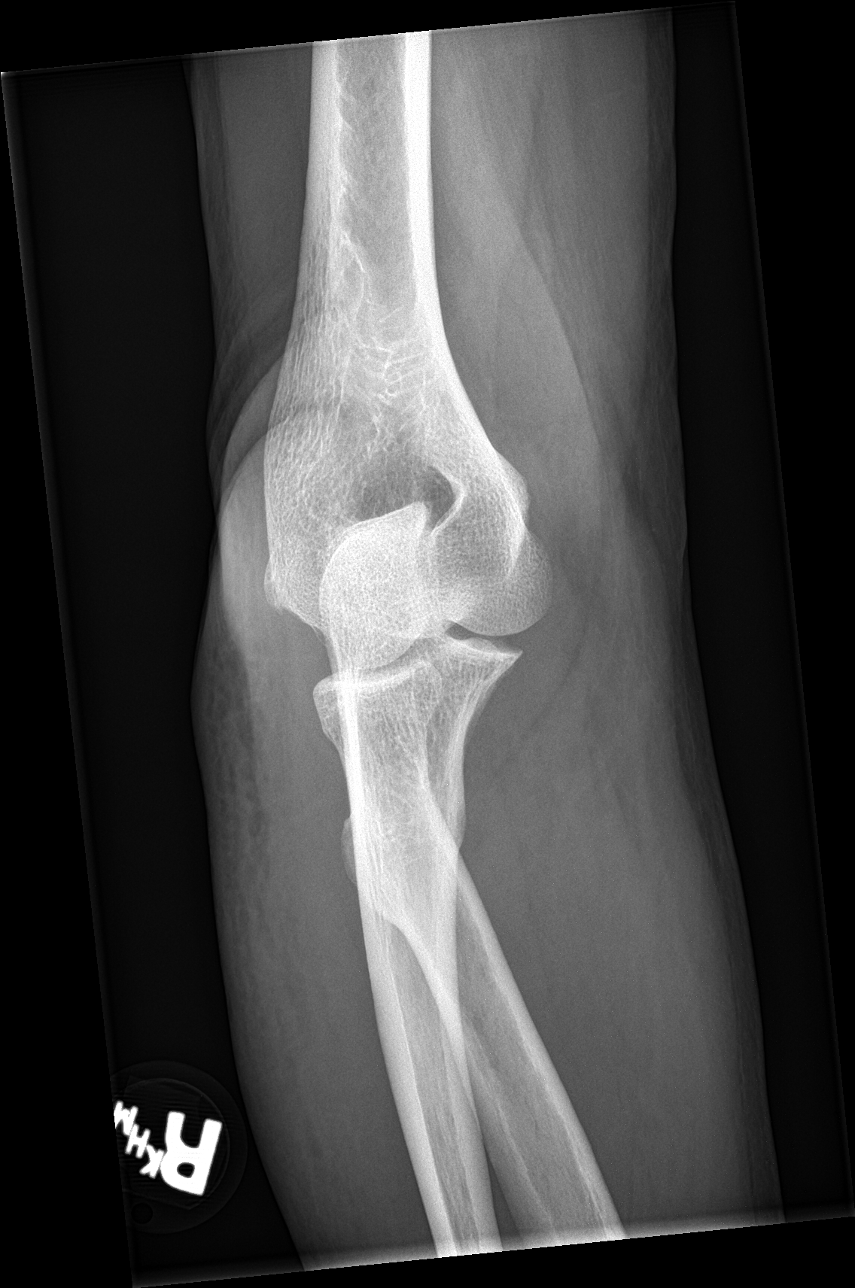

[4 of 4 positions shown; findings below may reference images not displayed]

FINDINGS: There is no evidence of fracture, dislocation, or joint effusion.
There is no evidence of arthropathy or other focal bone abnormality.
Soft tissue swelling is seen overlying the olecranon suggesting
inflammation or edema.
IMPRESSION: No fracture or dislocation is noted. Soft tissue swelling is seen
overlying the olecranon suggesting edema or inflammation.

## 2018-06-16 ENCOUNTER — Ambulatory Visit (INDEPENDENT_AMBULATORY_CARE_PROVIDER_SITE_OTHER): Payer: Medicare Other | Admitting: Cardiology

## 2018-06-16 ENCOUNTER — Encounter: Payer: Self-pay | Admitting: Cardiology

## 2018-06-16 VITALS — BP 155/84 | HR 60 | Ht 70.0 in | Wt 170.0 lb

## 2018-06-16 DIAGNOSIS — I2510 Atherosclerotic heart disease of native coronary artery without angina pectoris: Secondary | ICD-10-CM

## 2018-06-16 DIAGNOSIS — I10 Essential (primary) hypertension: Secondary | ICD-10-CM | POA: Diagnosis not present

## 2018-06-16 MED ORDER — LISINOPRIL 5 MG PO TABS: 5.0000 mg | ORAL_TABLET | Freq: Every day | ORAL | 1 refills | Status: DC

## 2018-06-16 MED ORDER — NITROGLYCERIN 0.4 MG SL SUBL: 0.4000 mg | SUBLINGUAL_TABLET | SUBLINGUAL | 3 refills | Status: DC | PRN

## 2018-06-16 NOTE — Patient Instructions (Signed)
Your physician wants you to follow-up in: Real will receive a reminder letter in the mail two months in advance. If you don't receive a letter, please call our office to schedule the follow-up appointment.  Your physician has recommended you make the following change in your medication:   START LISINOPRIL 5 MG DAILY  Your physician recommends that you return for lab work in: 2 WEEKS BMP  Thank you for choosing [facility]!!

## 2018-06-16 NOTE — Progress Notes (Signed)
Clinical Summary Angel [name] is a 78 y.o.male seen today for follow up of the following medical problems.    1. CAD - history of prior stentin 9/ 2011 to RCA   - no recent chest pain. No SOB/DOE - compliant with meds  2. HTN - higher doses of norvasc caused some leg swelling and dizziness  - compliant with meds - home bp's around 130-140s/70s    Past Medical History:  Diagnosis Date  . Bladder stones   . CAD (coronary artery disease)    BMS RCA 8/11; LVEF 50-55%  . Cancer ([place])   . Deep vein thrombosis (DVT) ([place])   . Gall bladder stones   . GERD (gastroesophageal reflux disease)   . Gout   . History of kidney stones   . HLD (hyperlipidemia)   . HTN (hypertension)   . MI (myocardial infarction) ([place])    MSTEMI 8/11  . Pneumonia 2013  . Rash, skin    on lower legs  . Renal disorder    kidney growth  . 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 Crestor [Rosuvastatin] Other (See Comments)    Unknown   . Penicillins Other (See Comments)    Per patient causes gi upset with high doses. He says he does OK with shots but gets GI upset with PO penicillins Can take small doses with no issues  . Tramadol Other (See Comments)    Confusion     Current Outpatient Medications  Medication Sig Dispense Refill  . amLODipine (NORVASC) 5 MG tablet Take 5 mg by mouth daily.    Marland Kitchen aspirin EC 81 MG tablet Take 81 mg by mouth daily.    . ciprofloxacin (CIPRO) 500 MG tablet Take 1 tablet (500 mg total) by mouth 2 (two) times daily. 14 tablet 0  . loperamide (IMODIUM A-D) 2 MG tablet Take 1 tablet (2 mg total) by mouth 4 (four) times daily as needed for diarrhea or loose stools. 30 tablet 0  . nitroGLYCERIN (NITROSTAT) 0.4 MG SL tablet Place 0.4 mg under the tongue every 5 (five) minutes as needed for chest pain.     Marland Kitchen ondansetron (ZOFRAN ODT) 4 MG disintegrating tablet  Take 1 tablet (4 mg total) by mouth every 8 (eight) hours as needed. 10 tablet 1   No current facility-administered medications for this visit.      Past Surgical History:  Procedure Laterality Date  . COLONOSCOPY    . COLONOSCOPY    . CORONARY ANGIOPLASTY  2010   Dr Johnsie Cancel  . CYSTOSCOPY WITH LITHOLAPAXY N/A 02/24/2017   Procedure: CYSTOSCOPY WITH LITHOLAPAXY;  Surgeon: Cleon Gustin, MD;  Location: [facility];  Service: Urology;  Laterality: N/A;  . CYSTOSCOPY WITH URETHRAL DILATATION  02/24/2017   Procedure: CYSTOSCOPY WITH URETHRAL DILATATION;  Surgeon: Cleon Gustin, MD;  Location: [facility];  Service: Urology;;  . Blenda Peals    . EAR CYST EXCISION Left 07/23/2012   Procedure: EXCISION LEFT EAR LOBE;  Surgeon: Melissa Montane, MD;  Location: [place];  Service: ENT;  Laterality: Left;  . HOLMIUM LASER APPLICATION N/A 97/67/3419   Procedure: HOLMIUM LASER APPLICATION OF BLADDER CALCULI;  Surgeon: Cleon Gustin, MD;  Location: [facility];  Service: Urology;  Laterality: N/A;  . IR RADIOLOGIST EVAL & MGMT  11/28/2016  Allergies  Allergen Reactions  . Crestor [Rosuvastatin] Other (See Comments)    Unknown   . Penicillins Other (See Comments)    Per patient causes gi upset with high doses. He says he does OK with shots but gets GI upset with PO penicillins Can take small doses with no issues  . Tramadol Other (See Comments)    Confusion      No family history on file.   Social History Mr. Olesen reports that he quit smoking about 19 years ago. His smoking use included cigarettes. He has a 45.00 pack-year smoking history. He has never used smokeless tobacco. Mr. [place] reports no history of alcohol use.   Review of Systems CONSTITUTIONAL: No weight loss, fever, chills, weakness or fatigue.  HEENT: Eyes: No visual loss, blurred vision, double vision or yellow sclerae.No hearing loss, sneezing, congestion, runny nose or sore throat.  SKIN: No rash or itching.    CARDIOVASCULAR: per hpi RESPIRATORY: No shortness of breath, cough or sputum.  GASTROINTESTINAL: No anorexia, nausea, vomiting or diarrhea. No abdominal pain or blood.  GENITOURINARY: No burning on urination, no polyuria NEUROLOGICAL: No headache, dizziness, syncope, paralysis, ataxia, numbness or tingling in the extremities. No change in bowel or bladder control.  MUSCULOSKELETAL: No muscle, back pain, joint pain or stiffness.  LYMPHATICS: No enlarged nodes. No history of splenectomy.  PSYCHIATRIC: No history of depression or anxiety.  ENDOCRINOLOGIC: No reports of sweating, cold or heat intolerance. No polyuria or polydipsia.  Marland Kitchen   Physical Examination Vitals:   06/16/18 1246  BP: (!) 155/84  Pulse: 60  SpO2: 95%   Vitals:   06/16/18 1246  Weight: 170 lb (77.1 kg)  Height: 5\' 10"  (1.778 m)    Gen: resting comfortably, no acute distress HEENT: no scleral icterus, pupils equal round and reactive, no palptable cervical adenopathy,  CV: RRR, no m/rg/ no jvd Resp: Clear to auscultation bilaterally GI: abdomen is soft, non-tender, non-distended, normal bowel sounds, no hepatosplenomegaly MSK: extremities are warm, no edema.  Skin: warm, no rash Neuro:  no focal deficits Psych: appropriate affect   Diagnostic Studies 01/2017 echo Study Conclusions  - Left ventricle: The cavity size was normal. Wall thickness was increased in a pattern of mild LVH. Systolic function was normal. The estimated ejection fraction was in the range of 60% to 65%. Wall motion was normal; there were no regional wall motion abnormalities. Doppler parameters are consistent with abnormal left ventricular relaxation (grade 1 diastolic dysfunction). - Aortic valve: There was mild regurgitation. - Mitral valve: There was mild regurgitation. - Tricuspid valve: There was moderate regurgitation. - Pulmonary arteries: PA peak pressure: 38 mm Hg (S).    Assessment and Plan  1. CAD -no  symptoms - continue current meds. He has refused statins. LDL of 105 does not strongly warrant attempt with alternative cholesterol agents.  - EKG today show SR, no ischemic changes.    2. HTN - above goal. Try starting lisinoril 5mg  daily,also cardiovascular benefits given his history of CAD. Check BMET in 2 weeks   F?u 1 year     Arnoldo Lenis, M.D.,

## 2018-06-17 ENCOUNTER — Telehealth: Payer: Self-pay | Admitting: Cardiology

## 2018-06-17 NOTE — Telephone Encounter (Signed)
Patient called stating that he is not going to take lisinopril (PRINIVIL,ZESTRIL) 5 MG too many side effects.  Marland Kitchen

## 2018-06-17 NOTE — Telephone Encounter (Signed)
Spoke with patient and he says that he took lisinopril in the past and it caused him to cough a little and it wasn't effective for his BP. Patient said he is not willing to retry lisinopril.

## 2018-06-18 NOTE — Telephone Encounter (Signed)
There is a medicine called losartan that has the same benefits as lisinopril without the cough. Would he be willing to start 25mg  daily, if so please place prescription. Can we list lisinopril as allergy with cough   J Obdulia Steier MD

## 2018-06-18 NOTE — Telephone Encounter (Signed)
Patient informed but declines starting any new bp medications at this time. Said he would like to stay on amlodipine.

## 2018-08-17 ENCOUNTER — Other Ambulatory Visit (HOSPITAL_COMMUNITY): Payer: Self-pay | Admitting: Urology

## 2018-08-17 ENCOUNTER — Telehealth: Payer: Self-pay | Admitting: Cardiology

## 2018-08-17 ENCOUNTER — Other Ambulatory Visit: Payer: Self-pay | Admitting: Urology

## 2018-08-17 DIAGNOSIS — C641 Malignant neoplasm of right kidney, except renal pelvis: Secondary | ICD-10-CM

## 2018-08-17 NOTE — Telephone Encounter (Signed)
Pt says he spoke with pcp who says he could take lisinopril - pt was confused on recent phone note - explained that he had mentioned lisinopril had given him a cough in the past and that Dr Harl Bowie suggested starting losartan instead of lisinopril - pt doesn't want to try losartan at this time says his BP has been WNL and denies any symptoms. Will forward to provider as Juluis Rainier - says he is scheduled for bladder US in the next few weeks and would have lab work at that time if that appt didn't get moved back

## 2018-08-17 NOTE — Telephone Encounter (Signed)
Would like to speak with nurse regarding medications and U/S he has scheduled./ tg

## 2018-08-31 ENCOUNTER — Ambulatory Visit (HOSPITAL_COMMUNITY): Admission: RE | Admit: 2018-08-31 | Payer: Medicare Other | Source: Ambulatory Visit

## 2018-08-31 ENCOUNTER — Encounter (HOSPITAL_COMMUNITY): Payer: Self-pay

## 2018-09-22 ENCOUNTER — Ambulatory Visit (HOSPITAL_COMMUNITY)
Admission: RE | Admit: 2018-09-22 | Discharge: 2018-09-22 | Disposition: A | Discharge status: Home / Self Care | Payer: Medicare Other | Source: Ambulatory Visit | Attending: Urology | Admitting: Urology

## 2018-09-22 ENCOUNTER — Other Ambulatory Visit: Payer: Self-pay

## 2018-09-22 DIAGNOSIS — C641 Malignant neoplasm of right kidney, except renal pelvis: Secondary | ICD-10-CM | POA: Diagnosis not present

## 2018-12-15 ENCOUNTER — Ambulatory Visit (INDEPENDENT_AMBULATORY_CARE_PROVIDER_SITE_OTHER): Payer: Medicare Other | Admitting: Urology

## 2018-12-15 DIAGNOSIS — N401 Enlarged prostate with lower urinary tract symptoms: Secondary | ICD-10-CM

## 2018-12-15 DIAGNOSIS — R102 Pelvic and perineal pain: Secondary | ICD-10-CM | POA: Diagnosis not present

## 2019-03-16 ENCOUNTER — Ambulatory Visit (INDEPENDENT_AMBULATORY_CARE_PROVIDER_SITE_OTHER): Payer: Medicare Other | Admitting: Urology

## 2019-03-16 DIAGNOSIS — C641 Malignant neoplasm of right kidney, except renal pelvis: Secondary | ICD-10-CM | POA: Diagnosis not present

## 2019-03-16 DIAGNOSIS — N401 Enlarged prostate with lower urinary tract symptoms: Secondary | ICD-10-CM

## 2019-03-24 ENCOUNTER — Other Ambulatory Visit: Payer: Self-pay | Admitting: Urology

## 2019-03-24 ENCOUNTER — Other Ambulatory Visit (HOSPITAL_COMMUNITY): Payer: Self-pay | Admitting: Urology

## 2019-03-24 DIAGNOSIS — D49511 Neoplasm of unspecified behavior of right kidney: Secondary | ICD-10-CM

## 2019-04-13 ENCOUNTER — Encounter (HOSPITAL_COMMUNITY): Payer: Self-pay

## 2019-04-13 ENCOUNTER — Ambulatory Visit (HOSPITAL_COMMUNITY): Payer: Medicare Other

## 2019-04-21 ENCOUNTER — Ambulatory Visit (INDEPENDENT_AMBULATORY_CARE_PROVIDER_SITE_OTHER): Payer: Medicare Other | Admitting: Urology

## 2019-04-21 DIAGNOSIS — R300 Dysuria: Secondary | ICD-10-CM | POA: Diagnosis not present

## 2019-04-21 DIAGNOSIS — N401 Enlarged prostate with lower urinary tract symptoms: Secondary | ICD-10-CM

## 2019-04-26 ENCOUNTER — Other Ambulatory Visit (HOSPITAL_COMMUNITY): Payer: Self-pay | Admitting: Urology

## 2019-04-26 ENCOUNTER — Other Ambulatory Visit: Payer: Self-pay | Admitting: Urology

## 2019-04-26 DIAGNOSIS — N200 Calculus of kidney: Secondary | ICD-10-CM

## 2019-06-17 ENCOUNTER — Telehealth: Payer: Self-pay | Admitting: Urology

## 2019-06-17 NOTE — Telephone Encounter (Signed)
Patient called and wanted to be seen today by Dr Alyson Ingles due to issues he is experiencing. I told him we arent seeing patients today and he requested a nurse return his call.

## 2019-06-18 ENCOUNTER — Other Ambulatory Visit: Payer: Self-pay

## 2019-06-18 ENCOUNTER — Ambulatory Visit: Payer: Medicare Other | Admitting: Urology

## 2019-06-18 DIAGNOSIS — N3000 Acute cystitis without hematuria: Secondary | ICD-10-CM

## 2019-06-18 NOTE — Progress Notes (Signed)
Pt called c/o frequency and burning x2 days. Pt will come by the office today and leave specimen.

## 2019-07-01 ENCOUNTER — Telehealth (INDEPENDENT_AMBULATORY_CARE_PROVIDER_SITE_OTHER): Payer: Medicare HMO | Admitting: Cardiology

## 2019-07-01 ENCOUNTER — Encounter: Payer: Self-pay | Admitting: Cardiology

## 2019-07-01 VITALS — BP 130/70 | HR 59 | Ht 70.0 in | Wt 169.0 lb

## 2019-07-01 DIAGNOSIS — I10 Essential (primary) hypertension: Secondary | ICD-10-CM

## 2019-07-01 DIAGNOSIS — I2510 Atherosclerotic heart disease of native coronary artery without angina pectoris: Secondary | ICD-10-CM

## 2019-07-01 MED ORDER — NITROGLYCERIN 0.4 MG SL SUBL: 0.4000 mg | SUBLINGUAL_TABLET | SUBLINGUAL | 3 refills | Status: DC | PRN

## 2019-07-01 NOTE — Patient Instructions (Signed)

## 2019-07-01 NOTE — Addendum Note (Signed)
Addended by: Julian Hy T on: 07/01/2019 10:40 AM   Modules accepted: Orders

## 2019-07-01 NOTE — Progress Notes (Signed)
Virtual Visit via Telephone Note   This visit type was conducted due to national recommendations for restrictions regarding the COVID-19 Pandemic (e.g. social distancing) in an effort to limit this patient's exposure and mitigate transmission in our community.  Due to his co-morbid illnesses, this patient is at least at moderate risk for complications without adequate follow up.  This format is felt to be most appropriate for this patient at this time.  The patient did not have access to video technology/had technical difficulties with video requiring transitioning to audio format only (telephone).  All issues noted in this document were discussed and addressed.  No physical exam could be performed with this format.  Please refer to the patient's chart for his  consent to telehealth for [facility].   Date:  07/01/2019   ID:  Angel [name], DOB 1940/11/10, MRN [id number]  Patient Location: Home Provider Location: Office  PCP:  Bernerd Limbo, MD  Cardiologist:  Carlyle Dolly, MD  Electrophysiologist:  None   Evaluation Performed:  Follow-Up Visit  Chief Complaint:  Follow up  History of Present Illness:    Angel [name] is a 79 y.o. male seen today for follow up of the following medical problems.   1. CAD - history of prior stentin 9/ 2011 to RCA He has refused statin. Some renal dysfunction, not on ARB. Has ACE allergy   - no recent chest pain - no SOB or DOE - compliant with meds  2. HTN - higher doses of norvasc caused some leg swelling and dizziness - bp at clinic appts 120/70s - compliant with meds   The patient does not have symptoms concerning for COVID-19 infection (fever, chills, cough, or new shortness of breath).    Past Medical History:  Diagnosis Date  . Bladder stones   . CAD (coronary artery disease)    BMS RCA 8/11; LVEF 50-55%  . Cancer ([place])   . Deep vein thrombosis (DVT) ([place])   . Gall bladder stones   . GERD (gastroesophageal reflux  disease)   . Gout   . History of kidney stones   . HLD (hyperlipidemia)   . HTN (hypertension)   . MI (myocardial infarction) ([place])    MSTEMI 8/11  . Pneumonia 2013  . Rash, skin    on lower legs  . Renal disorder    kidney growth  . 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 COLONOSCOPY    . COLONOSCOPY    . CORONARY ANGIOPLASTY  2010   Dr Johnsie Cancel  . CYSTOSCOPY WITH LITHOLAPAXY N/A 02/24/2017   Procedure: CYSTOSCOPY WITH LITHOLAPAXY;  Surgeon: Cleon Gustin, MD;  Location: [facility];  Service: Urology;  Laterality: N/A;  . CYSTOSCOPY WITH URETHRAL DILATATION  02/24/2017   Procedure: CYSTOSCOPY WITH URETHRAL DILATATION;  Surgeon: Cleon Gustin, MD;  Location: [facility];  Service: Urology;;  . Blenda Peals    . EAR CYST EXCISION Left 07/23/2012   Procedure: EXCISION LEFT EAR LOBE;  Surgeon: Melissa Montane, MD;  Location: [facility];  Service: ENT;  Laterality: Left;  . HOLMIUM LASER APPLICATION N/A [id number]   Procedure: HOLMIUM LASER APPLICATION OF BLADDER CALCULI;  Surgeon: Cleon Gustin, MD;  Location: [facility];  Service: Urology;  Laterality: N/A;  . IR RADIOLOGIST EVAL & MGMT  11/28/2016  No outpatient medications have been marked as taking for the 07/01/19 encounter (Appointment) with Arnoldo Lenis, MD.     Allergies:   Crestor [rosuvastatin], Penicillins, Tramadol, and Lisinopril   Social History   Tobacco Use  . Smoking status: Former Smoker    Packs/day: 1.50    Years: 30.00    Pack years: 45.00    Types: Cigarettes    Quit date: 05/14/1999    Years since quitting: 20.1  . Smokeless tobacco: Never Used  . Tobacco comment: quit 9/11  Substance Use Topics  . Alcohol use: No  . Drug use: No     Family Hx: The patient's family history is not on file.  ROS:   Please see the history of present illness.     All other  systems reviewed and are negative.   Prior CV studies:   The following studies were reviewed today:  01/2017 echo Study Conclusions  - Left ventricle: The cavity size was normal. Wall thickness was increased in a pattern of mild LVH. Systolic function was normal. The estimated ejection fraction was in the range of 60% to 65%. Wall motion was normal; there were no regional wall motion abnormalities. Doppler parameters are consistent with abnormal left ventricular relaxation (grade 1 diastolic dysfunction). - Aortic valve: There was mild regurgitation. - Mitral valve: There was mild regurgitation. - Tricuspid valve: There was moderate regurgitation. - Pulmonary arteries: PA peak pressure: 38 mm Hg (S).  Labs/Other Tests and Data Reviewed:    EKG:  No ECG reviewed.  Recent Labs: No results found for requested labs within last 8760 hours.   Recent Lipid Panel Lab Results  Component Value Date/Time   CHOL 200 03/16/2010 08:07 PM   TRIG 99 03/16/2010 08:07 PM   HDL 49 03/16/2010 08:07 PM   CHOLHDL 4.1 Ratio 03/16/2010 08:07 PM   LDLCALC 131 (H) 03/16/2010 08:07 PM    Wt Readings from Last 3 Encounters:  06/16/18 170 lb (77.1 kg)  07/22/17 173 lb (78.5 kg)  06/16/17 172 lb 6.4 oz (78.2 kg)     Objective:    Vital Signs:   Today's Vitals   07/01/19 0943  BP: 130/70  Pulse: (!) 59  Weight: 169 lb (76.7 kg)  Height: 5\' 10"  (1.778 m)   Body mass index is 24.25 kg/m. Normal affect. Normal speech pattern and tone. Comfortable, no apparent distress. No audible signs of SOB or wheezing.   ASSESSMENT & PLAN:    1. CAD -no recent symptoms - continue current meds. Not on statin or ARB for reasons reported above   2. HTN - at goal, continue current meds     COVID-19 Education: The signs and symptoms of COVID-19 were discussed with the patient and how to seek care for testing (follow up with PCP or arrange E-visit).  The importance of social distancing  was discussed today.  Time:   Today, I have spent 21 minutes with the patient with telehealth technology discussing the above problems.     Medication Adjustments/Labs and Tests Ordered: Current medicines are reviewed at length with the patient today.  Concerns regarding medicines are outlined above.   Tests Ordered: No orders of the defined types were placed in this encounter.   Medication Changes: No orders of the defined types were placed in this encounter.   Follow Up:  In Person in 1 year(s)  Signed, Carlyle Dolly, MD  07/01/2019 7:55 AM    [place]

## 2019-07-07 ENCOUNTER — Other Ambulatory Visit (HOSPITAL_COMMUNITY)
Admission: AD | Admit: 2019-07-07 | Discharge: 2019-07-07 | Disposition: A | Discharge status: Home / Self Care | Payer: Medicare HMO | Source: Skilled Nursing Facility | Attending: Urology | Admitting: Urology

## 2019-07-07 ENCOUNTER — Telehealth: Payer: Self-pay | Admitting: Cardiology

## 2019-07-07 ENCOUNTER — Ambulatory Visit (INDEPENDENT_AMBULATORY_CARE_PROVIDER_SITE_OTHER): Payer: Medicare HMO | Admitting: Urology

## 2019-07-07 ENCOUNTER — Encounter: Payer: Self-pay | Admitting: Urology

## 2019-07-07 ENCOUNTER — Other Ambulatory Visit: Payer: Self-pay

## 2019-07-07 VITALS — BP 168/67 | HR 65 | Temp 95.5°F | Ht 70.0 in | Wt 169.0 lb

## 2019-07-07 DIAGNOSIS — N3000 Acute cystitis without hematuria: Secondary | ICD-10-CM

## 2019-07-07 DIAGNOSIS — N210 Calculus in bladder: Secondary | ICD-10-CM | POA: Insufficient documentation

## 2019-07-07 LAB — POCT URINALYSIS DIPSTICK
Bilirubin, UA: NEGATIVE
Glucose, UA: NEGATIVE
Ketones, UA: NEGATIVE
Nitrite, UA: NEGATIVE
Protein, UA: POSITIVE — AB
Spec Grav, UA: 1.025 (ref 1.010–1.025)
Urobilinogen, UA: NEGATIVE E.U./dL — AB
pH, UA: 5 (ref 5.0–8.0)

## 2019-07-07 MED ORDER — SILODOSIN 4 MG PO CAPS: 4.0000 mg | ORAL_CAPSULE | Freq: Every day | ORAL | 11 refills | Status: DC

## 2019-07-07 MED ORDER — NITROFURANTOIN MACROCRYSTAL 100 MG PO CAPS: 100.0000 mg | ORAL_CAPSULE | Freq: Two times a day (BID) | ORAL | 0 refills | Status: DC

## 2019-07-07 NOTE — Progress Notes (Signed)
07/07/2019 9:47 AM   [name] 04-24-1941 [id number]  Referring provider: Bernerd Limbo, MD [address] [place] [place],  [place] [address]  Dysuria  HPI: [name] is a 79yo here for followup for acute cystitis and bladder calculi. He has had numerous UTIs in the past 6 months. He has severe LUTS and was given rx for rapaflo last visit which he did not take because he was worried it would cause hypotension. He has severe dysuria, frequency, urgency and nocturia 3-6x. He has known bladder calculi which are enlarging. He has a feeling that something is getting stuck in his urethra. He passed a calculus 2 weeks ago.   His records from [facility] are as follows: 11/20/2016: He passed a bladder calculus 1 week ago. He is bothered by his bladder calculi and wants then removed.   01/22/2017: He has a right lower extremity DVT and is currently under treatment. He was seen by his cardiologist and had an echo. he continues to have urgency and dysuria. He passes stones once every 2-3 weeks.    03/03/2017: s/p cystolithalopaxy. Foley removed last wednesday. He has nocturia every hour.   07/30/2017: He is doing well on flomax and finasteride. PVR is 0   02/18/2018: He notes worsening nocturia since last visit. he is on finasteride and flomax   12/15/2018: He has since stopped his flomax and finasteride. He based multiple bladder calculi and his urination is better.   11.3.2020: He continues to not take any medications for his sx's for fear of side effects. He reports that his stream is adequately strong but is still having some bothersome urinary sx's -- he has had some improvement w/ switching to drinking only water.     PMH: Past Medical History:  Diagnosis Date  . Bladder stones   . CAD (coronary artery disease)    BMS RCA 8/11; LVEF 50-55%  . Cancer ([place])   . Deep vein thrombosis (DVT) ([place])   . Gall bladder stones   . GERD (gastroesophageal reflux disease)   . Gout   . History  of kidney stones   . HLD (hyperlipidemia)   . HTN (hypertension)   . MI (myocardial infarction) ([place])    MSTEMI 8/11  . Pneumonia 2013  . Rash, skin    on lower legs  . Renal disorder    kidney growth  . 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 COLONOSCOPY    . COLONOSCOPY    . CORONARY ANGIOPLASTY  2010   Dr Johnsie Cancel  . CYSTOSCOPY WITH LITHOLAPAXY N/A 02/24/2017   Procedure: CYSTOSCOPY WITH LITHOLAPAXY;  Surgeon: Cleon Gustin, MD;  [facility];  Service: Urology;  Laterality: N/A;  . CYSTOSCOPY WITH URETHRAL DILATATION  02/24/2017   Procedure: CYSTOSCOPY WITH URETHRAL DILATATION;  Surgeon: Cleon Gustin, MD;  [facility];  Service: Urology;;  . Blenda Peals    . EAR CYST EXCISION Left 07/23/2012   Procedure: EXCISION LEFT EAR LOBE;  Surgeon: Melissa Montane, MD;  Location: [facility];  Service: ENT;  Laterality: Left;  . HOLMIUM LASER APPLICATION N/A [id number]   Procedure: HOLMIUM LASER APPLICATION OF BLADDER CALCULI;  Surgeon: Cleon Gustin, MD;  [facility];  Service: Urology;  Laterality: N/A;  . IR RADIOLOGIST EVAL & MGMT  11/28/2016  Home Medications:  Allergies as of 07/07/2019      Reactions   Ciprofloxacin Other (See Comments)   Crestor [rosuvastatin] Other (See Comments)   Unknown    Penicillins Other (See Comments)   Per patient causes gi upset with high doses. He says he does OK with shots but gets GI upset with PO penicillins Can take small doses with no issues   Tramadol Other (See Comments)   Confusion   Lisinopril Rash, Cough   Patient says it caused bp to increase and caused a rash on his legs       Medication List       Accurate as of July 07, 2019  9:47 AM. If you have any questions, ask your nurse or doctor.        amLODipine 5 MG tablet Commonly known as:  NORVASC Take 5 mg by mouth daily.   aspirin EC 81 MG tablet Take 81 mg by mouth daily.   nitroGLYCERIN 0.4 MG SL tablet Commonly known as: NITROSTAT Place 1 tablet (0.4 mg total) under the tongue every 5 (five) minutes as needed for chest pain.       Allergies:  Allergies  Allergen Reactions  . Ciprofloxacin Other (See Comments)  . Crestor [Rosuvastatin] Other (See Comments)    Unknown   . Penicillins Other (See Comments)    Per patient causes gi upset with high doses. He says he does OK with shots but gets GI upset with PO penicillins Can take small doses with no issues  . Tramadol Other (See Comments)    Confusion  . Lisinopril Rash and Cough    Patient says it caused bp to increase and caused a rash on his legs     Family History: No family history on file.  Social History:  reports that he quit smoking about 20 years ago. His smoking use included cigarettes. He has a 45.00 pack-year smoking history. He has never used smokeless tobacco. He reports that he does not drink alcohol or use drugs.  ROS: All other review of systems were reviewed and are negative except what is noted above in HPI  Physical Exam: BP (!) 168/67   Pulse 65   Temp (!) 95.5 F (35.3 C)   Ht 5\' 10"  (1.778 m)   Wt 169 lb (76.7 kg)   BMI 24.25 kg/m   Constitutional:  Alert and oriented, No acute distress. HEENT: [place] AT, moist mucus membranes.  Trachea midline, no masses. Cardiovascular: No clubbing, cyanosis, or edema. Respiratory: Normal respiratory effort, no increased work of breathing. GI: Abdomen is soft, nontender, nondistended, no abdominal masses GU: No CVA tenderness Lymph: No cervical or inguinal lymphadenopathy. Skin: No rashes, bruises or suspicious lesions. Neurologic: Grossly intact, no focal deficits, moving all 4 extremities. Psychiatric: Normal mood and affect.  Laboratory Data: Lab Results  Component Value Date   WBC 7.6 07/23/2017   HGB 15.5 07/23/2017   HCT 46.6  07/23/2017   MCV 90.1 07/23/2017   PLT 144 (L) 07/23/2017    Lab Results  Component Value Date   CREATININE 1.43 (H) 07/23/2017    No results found for: PSA  No results found for: TESTOSTERONE  No results found for: HGBA1C  Urinalysis    Component Value Date/Time   COLORURINE YELLOW 07/23/2017 1806   APPEARANCEUR CLOUDY (A) 07/23/2017 1806   LABSPEC 1.020 07/23/2017 1806   PHURINE 5.0 07/23/2017 1806   GLUCOSEU NEGATIVE 07/23/2017 1806   HGBUR MODERATE (A) 07/23/2017 1806  BILIRUBINUR neg 07/07/2019 0913   KETONESUR NEGATIVE 07/23/2017 1806   PROTEINUR Positive (A) 07/07/2019 0913   PROTEINUR 100 (A) 07/23/2017 1806   UROBILINOGEN negative (A) 07/07/2019 0913   UROBILINOGEN 0.2 07/25/2012 0446   NITRITE neg 07/07/2019 0913   NITRITE NEGATIVE 07/23/2017 1806   LEUKOCYTESUR Large (3+) (A) 07/07/2019 0913    Lab Results  Component Value Date   BACTERIA RARE (A) 07/23/2017    Pertinent Imaging:  No results found for this or any previous visit. No results found for this or any previous visit. No results found for this or any previous visit. No results found for this or any previous visit. Results for orders placed during the hospital encounter of 09/22/18  US RENAL   Narrative CLINICAL DATA:  79 year old male with follow-up of right renal mass. Cystoscopy on October 2018.  EXAM: RENAL / URINARY TRACT ULTRASOUND COMPLETE  COMPARISON:  Renal ultrasound dated 02/03/2018 and CT dated 07/23/2017  FINDINGS: Right Kidney:  Renal measurements: 2.5 x 5.6 x 5.7 cm = volume: 209 mL. The right kidney demonstrates a normal echogenicity. No hydronephrosis or shadowing stone. There is a 4.8 x 3.7 x 4.9 cm (previously 4.3 x 3.9 x 4.4 cm) solid mass with increased echogenicity compared to the renal parenchyma arising from the inferior pole of the right kidney. Doppler images demonstrate some flow within this mass.  Left Kidney:  Renal measurements: 10.6 x 5.6 x 5.6 cm  = volume: 173 mL. Normal echogenicity. There is a 6 mm nonobstructing inferior pole calculus. No hydronephrosis.  Bladder:  The urinary bladder is only partially distended and grossly unremarkable for the degree of distention. Ureteral jets are not visualized.  The prostate gland is enlarged measuring 5.7 x 5.2 x 6.0 cm for a volume of 95 cc.  IMPRESSION: 1. Similar-sized or minimally increased size of the right renal inferior pole mass compared to the prior ultrasound. 2. A 6 mm nonobstructing left renal inferior pole calculus. No hydronephrosis or obstructing stone. 3. Enlarged prostate gland.   Electronically Signed   By: Anner Crete M.D.   On: 09/22/2018 20:38    No results found for this or any previous visit. No results found for this or any previous visit. No results found for this or any previous visit.  Assessment & Plan:    1. Acute cystitis without hematuria -Urine for culture -macrodantin 100mg  BID fro 10 days -restart rapaflo 4mg  qhs - POCT urinalysis dipstick  2. Calculus of bladder We discussed the management including cystolithalopaxy and the patient wishes to proceed with surgery. Risks/benefits/alternative discussed   No follow-ups on file.  Nicolette Bang, MD  [facility] [place]

## 2019-07-07 NOTE — Telephone Encounter (Signed)
Pt is wanting to know if he needs to be seen in the office to get clearance for an upcoming surgery.   Please give pt a call @ [phone]

## 2019-07-07 NOTE — Patient Instructions (Signed)
Bladder Stone  A bladder stone is a buildup of crystals made from the proteins and minerals found in urine. These substances build up when urine becomes too concentrated. Urine is concentrated when there is less water and more proteins and minerals in it. Bladder stones usually develop when a person has another medical condition that prevents the bladder from emptying completely. Crystals can form in the small amount of urine that is left in the bladder. Bladder stones that grow large can become painful and may block the flow of urine. What are the causes? This condition may be caused by:  An enlarged prostate, which prevents the bladder from emptying well.  An infection of a part of your urinary system (urinary tract infection, or UTI). This includes the: ? Kidneys. ? Bladder. ? Ureters. These are the tubes that carry urine to your bladder. ? Urethra. This is the tube that drains urine from your bladder.  A weak spot in the bladder that creates a small pouch (bladder diverticulum).  Nerve damage that may interfere with the signals from your brain to your bladder muscles (neurogenic bladder). This can result from conditions such as Parkinson's disease or spinal cord injuries. What increases the risk? This condition is more likely to develop in people who:  Get frequent UTIs.  Have another medical condition that affects the bladder.  Have a history of bladder surgery.  Have a spinal cord injury.  Have an abnormal shape of the bladder (deformity). What are the signs or symptoms? Common symptoms of this condition include:  Pain in the abdomen.  A need to urinate more often.  Difficulty or pain when urinating.  Blood in the urine.  Cloudy urine or urine that is dark in color.  Pain in the penis or testicles in men. Small bladder stones do not always cause symptoms. How is this diagnosed? This condition may be diagnosed based on your symptoms, medical history, and physical  exam. The physical exam will check for tenderness in your abdomen. For men, an exam in the rectum may be done to check the prostate gland.  You may have tests, such as: ? A urine test (urinalysis). ? A urine sample test to check for other infections (culture). ? Blood tests, including tests to look for a certain substance (creatinine). A creatinine level that is higher than normal could indicate a blockage. ? A procedure to check your bladder using a scope with a camera (cystoscopy).  You may also have imaging studies, such as: ? CT scan or ultrasound of your abdomen and the area between your hip bones (pelvis or pelvic area). ? An X-ray of your urinary system. How is this treated? This condition may be treated with:  Cystolitholapaxy. This procedure uses a laser, ultrasound, or other device to break the stone into smaller pieces. Fluids are used to flush the small pieces from the area.  Surgery to remove the stone.  A stent. This is a small mesh tube that is threaded into your ureter to make urine flow.  Medicines to treat pain. Follow these instructions at home: Medicines  Take over-the-counter and prescription medicines only as told by your health care provider.  Ask your health care provider if the medicine prescribed to you: ? Requires you to avoid driving or using heavy machinery. ? Can cause constipation. You may need to take these actions to prevent or treat constipation:  Take over-the-counter or prescription medicines.  Eat foods that are high in fiber, such as beans, whole  grains, and fresh fruits and vegetables.  Limit foods that are high in fat and processed sugars, such as fried or sweet foods. Alcohol use  Do not drink alcohol if: ? Your health care provider tells you not to drink. ? You are pregnant, may be pregnant, or are planning to become pregnant.  If you drink alcohol: ? Limit how much you drink to:  0-1 drink a day for women.  0-2 drinks a day for  men. ? Be aware of how much alcohol is in your drink. In the [place] one drink equals one 12 oz bottle of beer (355 mL), one 5 oz glass of wine (148 mL), or one 1 oz glass of hard liquor (44 mL). Activity  Rest as told by your health care provider.  Return to your normal activities as told by your health care provider. Ask your health care provider what activities are safe for you. General instructions   Drink enough fluid to keep your urine pale yellow.  Tell your health care provider about any unusual symptoms related to urinating. Early diagnosis of an enlarged prostate and other bladder conditions may reduce your risk of getting bladder stones.  Do not use any products that contain nicotine or tobacco, such as cigarettes, e-cigarettes, or chewing tobacco. If you need help quitting, ask your health care provider.  Do not use drugs. Where to find more information [facility] ([facility]): www.urologyhealth.org Contact a health care provider if you:  Have a fever.  Feel nauseous or vomit.  Are unable to urinate.  Have a large amount of blood in your urine. Get help right away if you:  Have severe back pain or pain in the lower part of your abdomen.  Cannot eat or drink without vomiting.  Vomit after taking your medicine. Summary  A bladder stone is a buildup of crystals made from the proteins and minerals found in urine. These substances build up when urine becomes too concentrated.  Bladder stones that grow large can become painful and may block the flow of urine.  Bladder stones may be treated with a laser, a stent, surgery, or pain medicines. This information is not intended to replace advice given to you by your health care provider. Make sure you discuss any questions you have with your health care provider. Document Revised: 11/19/2018 Document Reviewed: 11/19/2018 Elsevier Patient Education  2020 [employer]  

## 2019-07-07 NOTE — Telephone Encounter (Signed)
Seen by [facility].i told patient if they want cardiac clearance they will fax form to [place] and then Dr.Branch will complete or have patient come in for an apt he has renal stones that need lithotripsy.

## 2019-07-07 NOTE — Addendum Note (Signed)
Addended by: Dorisann Frames on: 07/07/2019 10:23 AM   Modules accepted: Orders

## 2019-07-07 NOTE — Progress Notes (Signed)
Urological Symptom Review  Patient is experiencing the following symptoms: Frequent urination Hard to postpone urination Burning/pain with urination Get up at night to urinate   Review of Systems  Gastrointestinal (upper)  : Negative for upper GI symptoms  Gastrointestinal (lower) : Negative for lower GI symptoms  Constitutional : Negative for symptoms  Skin: Negative for skin symptoms  Eyes: Negative for eye symptoms  Ear/Nose/Throat : Negative for Ear/Nose/Throat symptoms  Hematologic/Lymphatic: Negative for Hematologic/Lymphatic symptoms  Cardiovascular : Negative for cardiovascular symptoms  Respiratory : Negative for respiratory symptoms  Endocrine: Negative for endocrine symptoms  Musculoskeletal: Negative for musculoskeletal symptoms   Back pain Neurological: Negative for neurological symptoms  Psychologic: Negative for psychiatric symptoms

## 2019-07-09 ENCOUNTER — Other Ambulatory Visit: Payer: Self-pay

## 2019-07-09 ENCOUNTER — Telehealth: Payer: Self-pay

## 2019-07-09 DIAGNOSIS — N3000 Acute cystitis without hematuria: Secondary | ICD-10-CM

## 2019-07-09 LAB — URINE CULTURE: Culture: >=100000 — AB

## 2019-07-09 MED ORDER — NITROFURANTOIN MONOHYD MACRO 100 MG PO CAPS: 100.0000 mg | ORAL_CAPSULE | Freq: Two times a day (BID) | ORAL | 0 refills | Status: DC

## 2019-07-09 NOTE — Telephone Encounter (Signed)
-----   Message from Dorisann Frames, RN sent at 07/09/2019  2:04 PM EST ----- Please review culture

## 2019-07-09 NOTE — Telephone Encounter (Signed)
I spoke with Dr. Cassandria Anger about pt urine culture result. Dr. Alyson Ingles ordered macrobid. I called pt to tell. Pt reports allergy to macrobid of rash. Pt reports he went to pcp today and is on ampicillin 250mg . Will notify MD

## 2019-07-16 ENCOUNTER — Ambulatory Visit (HOSPITAL_COMMUNITY): Payer: Medicare HMO

## 2019-07-16 ENCOUNTER — Encounter (HOSPITAL_COMMUNITY): Payer: Self-pay

## 2019-07-21 ENCOUNTER — Ambulatory Visit: Payer: Medicare Other | Admitting: Urology

## 2019-08-23 NOTE — Patient Instructions (Signed)
Angel [name]  08/23/2019     @PREFPERIOPPHARMACY @   Your procedure is scheduled on  08/30/2019 .  Report to [facility] at  [phone]  A.M.  Call this number if you have problems the morning of surgery:  [phone]   Remember:  Do not eat or drink after midnight.                Take these medicines the morning of surgery with A SIP OF WATER amlodipine.    Do not wear jewelry, make-up or nail polish.  Do not wear lotions, powders, or perfumes. Please wear deodorant and brush your teeth.  Do not shave 48 hours prior to surgery.  Men may shave face and neck.  Do not bring valuables to the hospital.  [facility] is not responsible for any belongings or valuables.  Contacts, dentures or bridgework may not be worn into surgery.  Leave your suitcase in the car.  After surgery it may be brought to your room.  For patients admitted to the hospital, discharge time will be determined by your treatment team.  Patients discharged the day of surgery will not be allowed to drive home.   Name and phone number of your driver:   family Special instructions:  DO NOT smoke the morning of your surgery.  Please read over the following fact sheets that you were given. Anesthesia Post-op Instructions and Care and Recovery After Surgery       Cystoscopy Cystoscopy is a procedure that is used to help diagnose and sometimes treat conditions that affect the lower urinary tract. The lower urinary tract includes the bladder and the urethra. The urethra is the tube that drains urine from the bladder. Cystoscopy is done using a thin, tube-shaped instrument with a light and camera at the end (cystoscope). The cystoscope may be hard or flexible, depending on the goal of the procedure. The cystoscope is inserted through the urethra, into the bladder. Cystoscopy may be recommended if you have:  Urinary tract infections that keep coming back.  Blood in the urine (hematuria).  An inability to control  when you urinate (urinary incontinence) or an overactive bladder.  Unusual cells found in a urine sample.  A blockage in the urethra, such as a urinary stone.  Painful urination.  An abnormality in the bladder found during an intravenous pyelogram (IVP) or CT scan. Cystoscopy may also be done to remove a sample of tissue to be examined under a microscope (biopsy). Tell a health care provider about:  Any allergies you have.  All medicines you are taking, including vitamins, herbs, eye drops, creams, and over-the-counter medicines.  Any problems you or family members have had with anesthetic medicines.  Any blood disorders you have.  Any surgeries you have had.  Any medical conditions you have.  Whether you are pregnant or may be pregnant. What are the risks? Generally, this is a safe procedure. However, problems may occur, including:  Infection.  Bleeding.  Allergic reactions to medicines.  Damage to other structures or organs. What happens before the procedure?  Ask your health care provider about: ? Changing or stopping your regular medicines. This is especially important if you are taking diabetes medicines or blood thinners. ? Taking medicines such as aspirin and ibuprofen. These medicines can thin your blood. Do not take these medicines unless your health care provider tells you to take them. ? Taking over-the-counter medicines, vitamins, herbs, and supplements.  Follow instructions  from your health care provider about eating or drinking restrictions.  Ask your health care provider what steps will be taken to help prevent infection. These may include: ? Washing skin with a germ-killing soap. ? Taking antibiotic medicine.  You may have an exam or testing, such as: ? X-rays of the bladder, urethra, or kidneys. ? Urine tests to check for signs of infection.  Plan to have someone take you home from the hospital or clinic. What happens during the  procedure?   You will be given one or more of the following: ? A medicine to help you relax (sedative). ? A medicine to numb the area (local anesthetic).  The area around the opening of your urethra will be cleaned.  The cystoscope will be passed through your urethra into your bladder.  Germ-free (sterile) fluid will flow through the cystoscope to fill your bladder. The fluid will stretch your bladder so that your health care provider can clearly examine your bladder walls.  Your doctor will look at the urethra and bladder. Your doctor may take a biopsy or remove stones.  The cystoscope will be removed, and your bladder will be emptied. The procedure may vary among health care providers and hospitals. What can I expect after the procedure? After the procedure, it is common to have:  Some soreness or pain in your abdomen and urethra.  Urinary symptoms. These include: ? Mild pain or burning when you urinate. Pain should stop within a few minutes after you urinate. This may last for up to 1 week. ? A small amount of blood in your urine for several days. ? Feeling like you need to urinate but producing only a small amount of urine. Follow these instructions at home: Medicines  Take over-the-counter and prescription medicines only as told by your health care provider.  If you were prescribed an antibiotic medicine, take it as told by your health care provider. Do not stop taking the antibiotic even if you start to feel better. General instructions  Return to your normal activities as told by your health care provider. Ask your health care provider what activities are safe for you.  Do not drive for 24 hours if you were given a sedative during your procedure.  Watch for any blood in your urine. If the amount of blood in your urine increases, call your health care provider.  Follow instructions from your health care provider about eating or drinking restrictions.  If a tissue  sample was removed for testing (biopsy) during your procedure, it is up to you to get your test results. Ask your health care provider, or the department that is doing the test, when your results will be ready.  Drink enough fluid to keep your urine pale yellow.  Keep all follow-up visits as told by your health care provider. This is important. Contact a health care provider if you:  Have pain that gets worse or does not get better with medicine, especially pain when you urinate.  Have trouble urinating.  Have more blood in your urine. Get help right away if you:  Have blood clots in your urine.  Have abdominal pain.  Have a fever or chills.  Are unable to urinate. Summary  Cystoscopy is a procedure that is used to help diagnose and sometimes treat conditions that affect the lower urinary tract.  Cystoscopy is done using a thin, tube-shaped instrument with a light and camera at the end.  After the procedure, it is common to have  some soreness or pain in your abdomen and urethra.  Watch for any blood in your urine. If the amount of blood in your urine increases, call your health care provider.  If you were prescribed an antibiotic medicine, take it as told by your health care provider. Do not stop taking the antibiotic even if you start to feel better. This information is not intended to replace advice given to you by your health care provider. Make sure you discuss any questions you have with your health care provider. Document Revised: 04/21/2018 Document Reviewed: 04/21/2018 Elsevier Patient Education  2020 [place] After These instructions provide you with information about caring for yourself after your procedure. Your health care provider may also give you more specific instructions. Your treatment has been planned according to current medical practices, but problems sometimes occur. Call your health care provider if you have any problems  or questions after your procedure. What can I expect after the procedure? After your procedure, you may:  Feel sleepy for several hours.  Feel clumsy and have poor balance for several hours.  Feel forgetful about what happened after the procedure.  Have poor judgment for several hours.  Feel nauseous or vomit.  Have a sore throat if you had a breathing tube during the procedure. Follow these instructions at home: For at least 24 hours after the procedure:      Have a responsible adult stay with you. It is important to have someone help care for you until you are awake and alert.  Rest as needed.  Do not: ? Participate in activities in which you could fall or become injured. ? Drive. ? Use heavy machinery. ? Drink alcohol. ? Take sleeping pills or medicines that cause drowsiness. ? Make important decisions or sign legal documents. ? Take care of children on your own. Eating and drinking  Follow the diet that is recommended by your health care provider.  If you vomit, drink water, juice, or soup when you can drink without vomiting.  Make sure you have little or no nausea before eating solid foods. General instructions  Take over-the-counter and prescription medicines only as told by your health care provider.  If you have sleep apnea, surgery and certain medicines can increase your risk for breathing problems. Follow instructions from your health care provider about wearing your sleep device: ? Anytime you are sleeping, including during daytime naps. ? While taking prescription pain medicines, sleeping medicines, or medicines that make you drowsy.  If you smoke, do not smoke without supervision.  Keep all follow-up visits as told by your health care provider. This is important. Contact a health care provider if:  You keep feeling nauseous or you keep vomiting.  You feel light-headed.  You develop a rash.  You have a fever. Get help right away if:  You have  trouble breathing. Summary  For several hours after your procedure, you may feel sleepy and have poor judgment.  Have a responsible adult stay with you for at least 24 hours or until you are awake and alert. This information is not intended to replace advice given to you by your health care provider. Make sure you discuss any questions you have with your health care provider. Document Revised: 07/28/2017 Document Reviewed: 08/20/2015 Elsevier Patient Education  [place].

## 2019-08-26 ENCOUNTER — Encounter (HOSPITAL_COMMUNITY)
Admission: RE | Admit: 2019-08-26 | Discharge: 2019-08-26 | Disposition: A | Discharge status: Home / Self Care | Payer: Medicare HMO | Source: Ambulatory Visit | Attending: Urology | Admitting: Urology

## 2019-08-26 ENCOUNTER — Encounter (HOSPITAL_COMMUNITY): Payer: Self-pay

## 2019-08-26 ENCOUNTER — Other Ambulatory Visit: Payer: Self-pay

## 2019-08-26 ENCOUNTER — Other Ambulatory Visit (HOSPITAL_COMMUNITY)
Admission: RE | Admit: 2019-08-26 | Discharge: 2019-08-26 | Disposition: A | Discharge status: Home / Self Care | Payer: Medicare HMO | Source: Ambulatory Visit | Attending: Urology | Admitting: Urology

## 2019-08-26 ENCOUNTER — Telehealth: Payer: Self-pay | Admitting: Urology

## 2019-08-26 NOTE — Telephone Encounter (Signed)
Pt called this am c/o urine infection. Does not wish to proceed with surgery on Monday for bladder stones. He states, " I don't feel well" pt dropping off urine sample tomorrow. I infomed him of surgery being to remove stones which should help with his infections- pt still hesitant and wishes to speak with you first.

## 2019-08-26 NOTE — Pre-Procedure Instructions (Signed)
Patient did not show for his PAT. Spoke with office, Linus Orn, who will leave a message for Estill Bamberg to call us in the morning.

## 2019-08-26 NOTE — Telephone Encounter (Signed)
Pt stated he wanted to postpone his surgery and wants to speak with a clinical staff member before rescheduling.

## 2019-08-27 ENCOUNTER — Other Ambulatory Visit: Payer: Self-pay

## 2019-08-27 ENCOUNTER — Other Ambulatory Visit: Payer: Self-pay | Admitting: Urology

## 2019-08-27 ENCOUNTER — Ambulatory Visit: Payer: Medicare HMO

## 2019-08-27 ENCOUNTER — Other Ambulatory Visit (HOSPITAL_COMMUNITY)
Admission: AD | Admit: 2019-08-27 | Discharge: 2019-08-27 | Disposition: A | Discharge status: Home / Self Care | Payer: Medicare HMO | Source: Other Acute Inpatient Hospital | Attending: Urology | Admitting: Urology

## 2019-08-27 DIAGNOSIS — N3000 Acute cystitis without hematuria: Secondary | ICD-10-CM

## 2019-08-27 LAB — URINALYSIS, COMPLETE (UACMP) WITH MICROSCOPIC
Bilirubin Urine: NEGATIVE
Glucose, UA: NEGATIVE mg/dL
Ketones, ur: NEGATIVE mg/dL
Nitrite: NEGATIVE
Protein, ur: 100 mg/dL — AB
RBC / HPF: >50 RBC/hpf — ABNORMAL HIGH (ref 0–5)
Specific Gravity, Urine: 1.021 (ref 1.005–1.030)
WBC, UA: >50 WBC/hpf — ABNORMAL HIGH (ref 0–5)
pH: 5 (ref 5.0–8.0)

## 2019-08-27 MED ORDER — DOXYCYCLINE HYCLATE 100 MG PO CAPS: 100.0000 mg | ORAL_CAPSULE | Freq: Two times a day (BID) | ORAL | 0 refills | Status: DC

## 2019-08-27 NOTE — Progress Notes (Signed)
Urine spec collected from pt.

## 2019-08-29 LAB — URINE CULTURE: Culture: >=100000 — AB

## 2019-08-30 ENCOUNTER — Ambulatory Visit (HOSPITAL_COMMUNITY): Admission: RE | Admit: 2019-08-30 | Payer: Medicare HMO | Source: Home / Self Care | Admitting: Urology

## 2019-08-30 ENCOUNTER — Encounter (HOSPITAL_COMMUNITY): Admission: RE | Payer: Self-pay | Source: Home / Self Care

## 2019-08-30 SURGERY — CYSTOSCOPY, WITH BLADDER CALCULUS LITHOLAPAXY
Anesthesia: General

## 2019-09-03 ENCOUNTER — Other Ambulatory Visit: Payer: Self-pay

## 2019-09-08 ENCOUNTER — Ambulatory Visit: Payer: Medicare HMO | Admitting: Urology

## 2019-09-13 ENCOUNTER — Telehealth: Payer: Self-pay

## 2019-09-13 ENCOUNTER — Other Ambulatory Visit: Payer: Self-pay

## 2019-09-13 DIAGNOSIS — N3000 Acute cystitis without hematuria: Secondary | ICD-10-CM

## 2019-09-13 MED ORDER — AMPICILLIN 500 MG PO CAPS: 500.0000 mg | ORAL_CAPSULE | Freq: Two times a day (BID) | ORAL | 0 refills | Status: DC

## 2019-09-13 NOTE — Telephone Encounter (Signed)
Pt notified of rx sent in. Pt understanding the caution with gi upset.

## 2019-09-13 NOTE — Telephone Encounter (Signed)
-----   Message from Cleon Gustin, MD sent at 09/10/2019 10:59 AM EDT ----- Send it. It is GI upset ----- Message ----- From: Dorisann Frames, RN Sent: 09/03/2019  11:16 AM EDT To: Cleon Gustin, MD  Allergy to pcn ----- Message ----- From: Cleon Gustin, MD Sent: 09/03/2019  10:47 AM EDT To: Dorisann Frames, RN  Ampicillin 500mg  BID for 7 days please ----- Message ----- From: Dorisann Frames, RN Sent: 08/31/2019  11:18 AM EDT To: Cleon Gustin, MD  Urine culture

## 2019-10-19 ENCOUNTER — Telehealth: Payer: Self-pay | Admitting: Urology

## 2019-10-19 NOTE — Telephone Encounter (Signed)
Pt called and requested a nurse return his call.

## 2019-10-20 NOTE — Telephone Encounter (Signed)
Last note in chart is incorrect. This is how it should read: Pt called saying he was having sx like his kidney stones were hurting him again. Wants appt to see Dr. Alyson Ingles. He is booked so Gibson Ramp RN will have to schedule. Told pt we would call him back on Monday.

## 2019-10-20 NOTE — Telephone Encounter (Signed)
I Lft message for pt to call me back. Pt called back and he was notified. See prior note in chart.

## 2019-10-25 NOTE — Telephone Encounter (Signed)
Called left message. Pt can come this week on wednesday

## 2019-12-21 ENCOUNTER — Telehealth: Payer: Self-pay

## 2019-12-21 NOTE — Telephone Encounter (Signed)
Have him drop off a urine for culture

## 2019-12-22 ENCOUNTER — Other Ambulatory Visit: Payer: Self-pay

## 2019-12-22 ENCOUNTER — Other Ambulatory Visit: Payer: Medicare HMO

## 2019-12-22 DIAGNOSIS — N3000 Acute cystitis without hematuria: Secondary | ICD-10-CM

## 2019-12-24 LAB — URINE CULTURE: Organism ID, Bacteria: NO GROWTH

## 2019-12-24 NOTE — Progress Notes (Signed)
Letter sent via my chart

## 2020-02-07 ENCOUNTER — Other Ambulatory Visit: Payer: Self-pay

## 2020-02-07 ENCOUNTER — Ambulatory Visit (INDEPENDENT_AMBULATORY_CARE_PROVIDER_SITE_OTHER): Payer: Medicare HMO | Admitting: Urology

## 2020-02-07 ENCOUNTER — Encounter: Payer: Self-pay | Admitting: Urology

## 2020-02-07 VITALS — BP 157/65 | HR 57 | Temp 97.1°F | Ht 70.0 in | Wt 167.0 lb

## 2020-02-07 DIAGNOSIS — N3000 Acute cystitis without hematuria: Secondary | ICD-10-CM | POA: Diagnosis not present

## 2020-02-07 DIAGNOSIS — N210 Calculus in bladder: Secondary | ICD-10-CM

## 2020-02-07 LAB — MICROSCOPIC EXAMINATION
Bacteria, UA: NONE SEEN
Epithelial Cells (non renal): NONE SEEN /hpf (ref 0–10)
Renal Epithel, UA: NONE SEEN /hpf
WBC, UA: >30 /hpf — AB (ref 0–5)

## 2020-02-07 LAB — URINALYSIS, ROUTINE W REFLEX MICROSCOPIC
Bilirubin, UA: NEGATIVE
Glucose, UA: NEGATIVE
Ketones, UA: NEGATIVE
Nitrite, UA: NEGATIVE
Specific Gravity, UA: 1.02 (ref 1.005–1.030)
Urobilinogen, Ur: 0.2 mg/dL (ref 0.2–1.0)
pH, UA: 5 (ref 5.0–7.5)

## 2020-02-07 NOTE — Patient Instructions (Signed)
Bladder Stone  A bladder stone is a buildup of crystals made from the proteins and minerals found in urine. These substances build up when urine becomes too concentrated. Urine is concentrated when there is less water and more proteins and minerals in it. Bladder stones usually develop when a person has another medical condition that prevents the bladder from emptying completely. Crystals can form in the small amount of urine that is left in the bladder. Bladder stones that grow large can become painful and may block the flow of urine. What are the causes? This condition may be caused by:  An enlarged prostate, which prevents the bladder from emptying well.  An infection of a part of your urinary system (urinary tract infection, or UTI). This includes the: ? Kidneys. ? Bladder. ? Ureters. These are the tubes that carry urine to your bladder. ? Urethra. This is the tube that drains urine from your bladder.  A weak spot in the bladder that creates a small pouch (bladder diverticulum).  Nerve damage that may interfere with the signals from your brain to your bladder muscles (neurogenic bladder). This can result from conditions such as Parkinson's disease or spinal cord injuries. What increases the risk? This condition is more likely to develop in people who:  Get frequent UTIs.  Have another medical condition that affects the bladder.  Have a history of bladder surgery.  Have a spinal cord injury.  Have an abnormal shape of the bladder (deformity). What are the signs or symptoms? Common symptoms of this condition include:  Pain in the abdomen.  A need to urinate more often.  Difficulty or pain when urinating.  Blood in the urine.  Cloudy urine or urine that is dark in color.  Pain in the penis or testicles in men. Small bladder stones do not always cause symptoms. How is this diagnosed? This condition may be diagnosed based on your symptoms, medical history, and physical  exam. The physical exam will check for tenderness in your abdomen. For men, an exam in the rectum may be done to check the prostate gland.  You may have tests, such as: ? A urine test (urinalysis). ? A urine sample test to check for other infections (culture). ? Blood tests, including tests to look for a certain substance (creatinine). A creatinine level that is higher than normal could indicate a blockage. ? A procedure to check your bladder using a scope with a camera (cystoscopy).  You may also have imaging studies, such as: ? CT scan or ultrasound of your abdomen and the area between your hip bones (pelvis or pelvic area). ? An X-ray of your urinary system. How is this treated? This condition may be treated with:  Cystolitholapaxy. This procedure uses a laser, ultrasound, or other device to break the stone into smaller pieces. Fluids are used to flush the small pieces from the area.  Surgery to remove the stone.  A stent. This is a small mesh tube that is threaded into your ureter to make urine flow.  Medicines to treat pain. Follow these instructions at home: Medicines  Take over-the-counter and prescription medicines only as told by your health care provider.  Ask your health care provider if the medicine prescribed to you: ? Requires you to avoid driving or using heavy machinery. ? Can cause constipation. You may need to take these actions to prevent or treat constipation:  Take over-the-counter or prescription medicines.  Eat foods that are high in fiber, such as beans, whole  grains, and fresh fruits and vegetables.  Limit foods that are high in fat and processed sugars, such as fried or sweet foods. Alcohol use  Do not drink alcohol if: ? Your health care provider tells you not to drink. ? You are pregnant, may be pregnant, or are planning to become pregnant.  If you drink alcohol: ? Limit how much you drink to:  0-1 drink a day for women.  0-2 drinks a day for  men. ? Be aware of how much alcohol is in your drink. In the [place] one drink equals one 12 oz bottle of beer (355 mL), one 5 oz glass of wine (148 mL), or one 1 oz glass of hard liquor (44 mL). Activity  Rest as told by your health care provider.  Return to your normal activities as told by your health care provider. Ask your health care provider what activities are safe for you. General instructions   Drink enough fluid to keep your urine pale yellow.  Tell your health care provider about any unusual symptoms related to urinating. Early diagnosis of an enlarged prostate and other bladder conditions may reduce your risk of getting bladder stones.  Do not use any products that contain nicotine or tobacco, such as cigarettes, e-cigarettes, or chewing tobacco. If you need help quitting, ask your health care provider.  Do not use drugs. Where to find more information [facility] ([facility]): www.urologyhealth.org Contact a health care provider if you:  Have a fever.  Feel nauseous or vomit.  Are unable to urinate.  Have a large amount of blood in your urine. Get help right away if you:  Have severe back pain or pain in the lower part of your abdomen.  Cannot eat or drink without vomiting.  Vomit after taking your medicine. Summary  A bladder stone is a buildup of crystals made from the proteins and minerals found in urine. These substances build up when urine becomes too concentrated.  Bladder stones that grow large can become painful and may block the flow of urine.  Bladder stones may be treated with a laser, a stent, surgery, or pain medicines. This information is not intended to replace advice given to you by your health care provider. Make sure you discuss any questions you have with your health care provider. Document Revised: 11/19/2018 Document Reviewed: 11/19/2018 Elsevier Patient Education  2020 [employer]  

## 2020-02-07 NOTE — Progress Notes (Signed)
02/07/2020 11:03 AM   Angel [name] 1940/09/10 [id number]  Referring provider: Bernerd Limbo, MD [facility] [address],  [place] [address]  Concern for UTI  HPI: Angel [name] is a 79yo here for followup for acute cystitis and bladder calculi. He has stable severe LUTS off his BPH meds. He passes 2-3 bladder calculi per week. He has baseline dysuria. He denies any hematuria. UA today shows WBCs and RBCs, no bacteria. His last urine culture showed no growth. He has known numerous bladder calculi and has cancelled his surgeries for cystolithalopaxy in the past.    PMH: Past Medical History:  Diagnosis Date  . Bladder stones   . CAD (coronary artery disease)    BMS RCA 8/11; LVEF 50-55%  . Cancer ([place])   . Deep vein thrombosis (DVT) ([place])   . Gall bladder stones   . GERD (gastroesophageal reflux disease)   . Gout   . History of kidney stones   . HLD (hyperlipidemia)   . HTN (hypertension)   . MI (myocardial infarction) ([place])    MSTEMI 8/11  . Pneumonia 2013  . Rash, skin    on lower legs  . Renal disorder    kidney growth  . 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 COLONOSCOPY    . COLONOSCOPY    . CORONARY ANGIOPLASTY  2010   Dr Johnsie Cancel  . CYSTOSCOPY WITH LITHOLAPAXY N/A 02/24/2017   Procedure: CYSTOSCOPY WITH LITHOLAPAXY;  Surgeon: Cleon Gustin, MD;  Location: [facility];  Service: Urology;  Laterality: N/A;  . CYSTOSCOPY WITH URETHRAL DILATATION  02/24/2017   Procedure: CYSTOSCOPY WITH URETHRAL DILATATION;  Surgeon: Cleon Gustin, MD;  Location: [facility];  Service: Urology;;  . Blenda Peals    . EAR CYST EXCISION Left 07/23/2012   Procedure: EXCISION LEFT EAR LOBE;  Surgeon: Melissa Montane, MD;  Location: [facility];  Service: ENT;  Laterality: Left;  . HOLMIUM LASER APPLICATION N/A 25/85/2778    Procedure: HOLMIUM LASER APPLICATION OF BLADDER CALCULI;  Surgeon: Cleon Gustin, MD;  Location: [facility];  Service: Urology;  Laterality: N/A;  . IR RADIOLOGIST EVAL & MGMT  11/28/2016    Home Medications:  Allergies as of 02/07/2020      Reactions   Ciprofloxacin Other (See Comments)   Crestor [rosuvastatin] Other (See Comments)   Unknown    Penicillins Other (See Comments)   Per patient causes gi upset with high doses. He says he does OK with shots but gets GI upset with PO penicillins Can take small doses with no issues   Tramadol Other (See Comments)   Confusion   Lisinopril Rash, Cough   Patient says it caused bp to increase and caused a rash on his legs    Macrodantin [nitrofurantoin] Rash      Medication List       Accurate as of February 07, 2020 11:03 AM. If you have any questions, ask your nurse or doctor.        acetaminophen 500 MG tablet Commonly known as: TYLENOL Take by mouth.   amLODipine 5 MG tablet Commonly known as: NORVASC Take 5 mg by mouth daily.   ampicillin 500 MG capsule Commonly known as: PRINCIPEN Take 1 capsule (500 mg total) by mouth in the morning and at bedtime.  aspirin EC 81 MG tablet Take 81 mg by mouth daily.   ibuprofen 200 MG tablet Commonly known as: ADVIL Take by mouth.   nitroGLYCERIN 0.4 MG SL tablet Commonly known as: NITROSTAT Place 1 tablet (0.4 mg total) under the tongue every 5 (five) minutes as needed for chest pain.   silodosin 4 MG Caps capsule Commonly known as: Rapaflo Take 1 capsule (4 mg total) by mouth daily with breakfast.       Allergies:  Allergies  Allergen Reactions  . Ciprofloxacin Other (See Comments)  . Crestor [Rosuvastatin] Other (See Comments)    Unknown   . Penicillins Other (See Comments)    Per patient causes gi upset with high doses. He says he does OK with shots but gets GI upset with PO penicillins Can take small doses with no issues  . Tramadol Other (See Comments)     Confusion  . Lisinopril Rash and Cough    Patient says it caused bp to increase and caused a rash on his legs   . Macrodantin [Nitrofurantoin] Rash    Family History: No family history on file.  Social History:  reports that he quit smoking about 20 years ago. His smoking use included cigarettes. He has a 45.00 pack-year smoking history. He has never used smokeless tobacco. He reports that he does not drink alcohol and does not use drugs.  ROS: All other review of systems were reviewed and are negative except what is noted above in HPI  Physical Exam: BP (!) 157/65   Pulse (!) 57   Temp (!) 97.1 F (36.2 C)   Ht 5\' 10"  (1.778 m)   Wt 167 lb (75.8 kg)   BMI 23.96 kg/m   Constitutional:  Alert and oriented, No acute distress. HEENT: [place] AT, moist mucus membranes.  Trachea midline, no masses. Cardiovascular: No clubbing, cyanosis, or edema. Respiratory: Normal respiratory effort, no increased work of breathing. GI: Abdomen is soft, nontender, nondistended, no abdominal masses GU: No CVA tenderness.  Lymph: No cervical or inguinal lymphadenopathy. Skin: No rashes, bruises or suspicious lesions. Neurologic: Grossly intact, no focal deficits, moving all 4 extremities. Psychiatric: Normal mood and affect.  Laboratory Data: Lab Results  Component Value Date   WBC 7.6 07/23/2017   HGB 15.5 07/23/2017   HCT 46.6 07/23/2017   MCV 90.1 07/23/2017   PLT 144 (L) 07/23/2017    Lab Results  Component Value Date   CREATININE 1.43 (H) 07/23/2017    No results found for: PSA  No results found for: TESTOSTERONE  No results found for: HGBA1C  Urinalysis    Component Value Date/Time   COLORURINE YELLOW 08/27/2019 1000   APPEARANCEUR Clear 02/07/2020 1037   LABSPEC 1.021 08/27/2019 1000   PHURINE 5.0 08/27/2019 1000   GLUCOSEU Negative 02/07/2020 1037   HGBUR LARGE (A) 08/27/2019 1000   BILIRUBINUR Negative 02/07/2020 1037   KETONESUR NEGATIVE 08/27/2019 1000   PROTEINUR 1+  (A) 02/07/2020 1037   PROTEINUR 100 (A) 08/27/2019 1000   UROBILINOGEN negative (A) 07/07/2019 0913   UROBILINOGEN 0.2 07/25/2012 0446   NITRITE Negative 02/07/2020 1037   NITRITE NEGATIVE 08/27/2019 1000   LEUKOCYTESUR 3+ (A) 02/07/2020 1037   LEUKOCYTESUR LARGE (A) 08/27/2019 1000    Lab Results  Component Value Date   LABMICR See below: 02/07/2020   WBCUA >30 (A) 02/07/2020   LABEPIT None seen 02/07/2020   BACTERIA None seen 02/07/2020    Pertinent Imaging:  No results found for this or any previous  visit.  No results found for this or any previous visit.  No results found for this or any previous visit.  No results found for this or any previous visit.  Results for orders placed during the hospital encounter of 09/22/18  US RENAL  Narrative CLINICAL DATA:  79 year old male with follow-up of right renal mass. Cystoscopy on October 2018.  EXAM: RENAL / URINARY TRACT ULTRASOUND COMPLETE  COMPARISON:  Renal ultrasound dated 02/03/2018 and CT dated 07/23/2017  FINDINGS: Right Kidney:  Renal measurements: 2.5 x 5.6 x 5.7 cm = volume: 209 mL. The right kidney demonstrates a normal echogenicity. No hydronephrosis or shadowing stone. There is a 4.8 x 3.7 x 4.9 cm (previously 4.3 x 3.9 x 4.4 cm) solid mass with increased echogenicity compared to the renal parenchyma arising from the inferior pole of the right kidney. Doppler images demonstrate some flow within this mass.  Left Kidney:  Renal measurements: 10.6 x 5.6 x 5.6 cm = volume: 173 mL. Normal echogenicity. There is a 6 mm nonobstructing inferior pole calculus. No hydronephrosis.  Bladder:  The urinary bladder is only partially distended and grossly unremarkable for the degree of distention. Ureteral jets are not visualized.  The prostate gland is enlarged measuring 5.7 x 5.2 x 6.0 cm for a volume of 95 cc.  IMPRESSION: 1. Similar-sized or minimally increased size of the right renal inferior pole  mass compared to the prior ultrasound. 2. A 6 mm nonobstructing left renal inferior pole calculus. No hydronephrosis or obstructing stone. 3. Enlarged prostate gland.   Electronically Signed By: Anner Crete M.D. On: 09/22/2018 20:38  No results found for this or any previous visit.  No results found for this or any previous visit.  No results found for this or any previous visit.   Assessment & Plan:    1. Acute cystitis without hematuria Urine for culture  - Urinalysis, Routine w reflex microscopic  2. Calculus of bladder -We discussed the management including cystolithalopaxy and after discussing the options the patient elects for cystolithalopaxy. Risks/benefits/alternatves discussed   No follow-ups on file.  Nicolette Bang, MD  [facility] [place]

## 2020-02-07 NOTE — Progress Notes (Signed)
Urological Symptom Review  Patient is experiencing the following symptoms: Frequent urination Hard to postpone urination Burning/pain with urination Get up at night to urinate Urinary tract infection  Bladder stones   Review of Systems  Gastrointestinal (upper)  : Negative for upper GI symptoms  Gastrointestinal (lower) : Negative for lower GI symptoms  Constitutional : Negative for symptoms  Skin: Negative for skin symptoms  Eyes: Blurred vision  Ear/Nose/Throat : Negative for Ear/Nose/Throat symptoms  Hematologic/Lymphatic: Easy bruising  Cardiovascular : Leg swelling  Respiratory : Shortness of breath  Endocrine: Negative for endocrine symptoms  Musculoskeletal: Negative for musculoskeletal symptoms  Neurological: Negative for neurological symptoms  Psychologic: Negative for psychiatric symptoms

## 2020-02-09 LAB — URINE CULTURE

## 2020-02-10 NOTE — Progress Notes (Signed)
Sent via mychart

## 2020-03-07 NOTE — Patient Instructions (Signed)
Angel [name]  03/07/2020     @PREFPERIOPPHARMACY @   Your procedure is scheduled on 03/13/2020.  Report to [facility] at  [phone]  A.M.  Call this number if you have problems the morning of surgery:  [phone]   Remember:  Do not eat or drink after midnight.                        Take these medicines the morning of surgery with A SIP OF WATER  amlodipine.    Do not wear jewelry, make-up or nail polish.  Do not wear lotions, powders, or perfumes. Please wear deodorant and brush  Your teeth.  Do not shave 48 hours prior to surgery.  Men may shave face and neck.  Do not bring valuables to the hospital.  [facility] is not responsible for any belongings or valuables.  Contacts, dentures or bridgework may not be worn into surgery.  Leave your suitcase in the car.  After surgery it may be brought to your room.  For patients admitted to the hospital, discharge time will be determined by your treatment team.  Patients discharged the day of surgery will not be allowed to drive home.   Name and phone number of your driver:   family Special instructions:  DO NOT smoke the morning of your procedure.  Please read over the following fact sheets that you were given. Anesthesia Post-op Instructions and Care and Recovery After Surgery       Laser Therapy for Kidney Stones, Care After This sheet gives you information about how to care for yourself after your procedure. Your health care provider may also give you more specific instructions. If you have problems or questions, contact your health care provider. What can I expect after the procedure? After the procedure, it is common to have:  Pain.  A burning sensation while urinating.  Small amounts of blood in your urine.  A need to urinate frequently.  Pieces of kidney stone in your urine.  Mild discomfort when urinating that may be felt in the back. You may experience this if you have a flexible tube (stent) in your  ureter. Follow these instructions at home:  Medicines  Take over-the-counter and prescription medicines only as told by your health care provider.  If you were prescribed an antibiotic medicine, take it as told by your health care provider. Do not stop taking the antibiotic even if you start to feel better.  Ask your health care provider if the medicine prescribed to you: ? Requires you to avoid driving or using heavy machinery. ? Can cause constipation. You may need to take actions to prevent or treat constipation, such as:  Take over-the-counter or prescription medicines.  Eat foods that are high in fiber, such as beans, whole grains, and fresh fruits and vegetables.  Limit foods that are high in fat and processed sugars, such as fried or sweet foods. Activity  Return to your normal activities as told by your health care provider. Ask your health care provider what activities are safe for you.  Do not drive for 24 hours if you were given a sedative during your procedure. General instructions  If your health care provider approves, you may take a warm bath to ease discomfort and burning.  Drink enough fluid to keep your urine pale yellow. Your health care provider may recommend drinking two 8 oz (237 mL) glasses of water per hour  for a few hours after your procedure.  You may be asked to strain your urine to collect any stone fragments that you pass. These fragments may be tested.  Keep all follow-up visits as told by your health care provider. This is important. If you have a stent, you will need to return to your health care provider to have the stent removed. Contact a health care provider if you:  Have pain or a burning feeling that lasts more than 2 days.  Feel nauseous.  Vomit more and more often.  Have difficulty urinating.  Have pain that gets worse or does not get better with medicine. Get help right away if:  You are unable to urinate, even if your bladder  feels full.  You have: ? Bright red blood or blood clots in your urine. ? More blood in your urine. ? Severe pain or discomfort. ? A fever or shaking chills. ? Abdominal pain. ? Difficulty breathing. ? Swelling in your legs. Summary  After the procedure, it is common to have a burning sensation while urinating and small amounts of blood in your urine.  Take over-the-counter and prescription medicines only as told by your health care provider.  Drink enough fluid to keep your urine pale yellow.  Keep all follow-up visits as told by your health care provider. This is important. This information is not intended to replace advice given to you by your health care provider. Make sure you discuss any questions you have with your health care provider. Document Revised: 01/08/2018 Document Reviewed: 01/08/2018 Elsevier Patient Education  [place] Anesthesia, Adult, Care After This sheet gives you information about how to care for yourself after your procedure. Your health care provider may also give you more specific instructions. If you have problems or questions, contact your health care provider. What can I expect after the procedure? After the procedure, the following side effects are common:  Pain or discomfort at the IV site.  Nausea.  Vomiting.  Sore throat.  Trouble concentrating.  Feeling cold or chills.  Weak or tired.  Sleepiness and fatigue.  Soreness and body aches. These side effects can affect parts of the body that were not involved in surgery. Follow these instructions at home:  For at least 24 hours after the procedure:  Have a responsible adult stay with you. It is important to have someone help care for you until you are awake and alert.  Rest as needed.  Do not: ? Participate in activities in which you could fall or become injured. ? Drive. ? Use heavy machinery. ? Drink alcohol. ? Take sleeping pills or medicines that cause  drowsiness. ? Make important decisions or sign legal documents. ? Take care of children on your own. Eating and drinking  Follow any instructions from your health care provider about eating or drinking restrictions.  When you feel hungry, start by eating small amounts of foods that are soft and easy to digest (bland), such as toast. Gradually return to your regular diet.  Drink enough fluid to keep your urine pale yellow.  If you vomit, rehydrate by drinking water, juice, or clear broth. General instructions  If you have sleep apnea, surgery and certain medicines can increase your risk for breathing problems. Follow instructions from your health care provider about wearing your sleep device: ? Anytime you are sleeping, including during daytime naps. ? While taking prescription pain medicines, sleeping medicines, or medicines that make you drowsy.  Return to your normal  activities as told by your health care provider. Ask your health care provider what activities are safe for you.  Take over-the-counter and prescription medicines only as told by your health care provider.  If you smoke, do not smoke without supervision.  Keep all follow-up visits as told by your health care provider. This is important. Contact a health care provider if:  You have nausea or vomiting that does not get better with medicine.  You cannot eat or drink without vomiting.  You have pain that does not get better with medicine.  You are unable to pass urine.  You develop a skin rash.  You have a fever.  You have redness around your IV site that gets worse. Get help right away if:  You have difficulty breathing.  You have chest pain.  You have blood in your urine or stool, or you vomit blood. Summary  After the procedure, it is common to have a sore throat or nausea. It is also common to feel tired.  Have a responsible adult stay with you for the first 24 hours after general anesthesia. It is  important to have someone help care for you until you are awake and alert.  When you feel hungry, start by eating small amounts of foods that are soft and easy to digest (bland), such as toast. Gradually return to your regular diet.  Drink enough fluid to keep your urine pale yellow.  Return to your normal activities as told by your health care provider. Ask your health care provider what activities are safe for you. This information is not intended to replace advice given to you by your health care provider. Make sure you discuss any questions you have with your health care provider. Document Revised: 05/02/2017 Document Reviewed: 12/13/2016 Elsevier Patient Education  [place].

## 2020-03-09 ENCOUNTER — Encounter (HOSPITAL_COMMUNITY): Payer: Self-pay

## 2020-03-09 ENCOUNTER — Other Ambulatory Visit: Payer: Self-pay

## 2020-03-09 ENCOUNTER — Encounter (HOSPITAL_COMMUNITY)
Admission: RE | Admit: 2020-03-09 | Discharge: 2020-03-09 | Disposition: A | Discharge status: Home / Self Care | Payer: Medicare HMO | Source: Ambulatory Visit | Attending: Urology | Admitting: Urology

## 2020-03-09 DIAGNOSIS — Z01818 Encounter for other preprocedural examination: Secondary | ICD-10-CM | POA: Insufficient documentation

## 2020-03-09 LAB — BASIC METABOLIC PANEL
Anion gap: 7 (ref 5–15)
BUN: 17 mg/dL (ref 8–23)
CO2: 25 mmol/L (ref 22–32)
Calcium: 8.9 mg/dL (ref 8.9–10.3)
Chloride: 104 mmol/L (ref 98–111)
Creatinine, Ser: 1.28 mg/dL — ABNORMAL HIGH (ref 0.61–1.24)
GFR, Estimated: 57 mL/min — ABNORMAL LOW (ref >60)
Glucose, Bld: 93 mg/dL (ref 70–99)
Potassium: 3.8 mmol/L (ref 3.5–5.1)
Sodium: 136 mmol/L (ref 135–145)

## 2020-03-10 ENCOUNTER — Other Ambulatory Visit (HOSPITAL_COMMUNITY)
Admission: RE | Admit: 2020-03-10 | Discharge: 2020-03-10 | Disposition: A | Discharge status: Home / Self Care | Payer: Medicare HMO | Source: Ambulatory Visit | Attending: Urology | Admitting: Urology

## 2020-03-10 NOTE — Progress Notes (Signed)
Short Stay called and said patient canceled his procedure. No need for covid screening. Nothing further needed.

## 2020-03-13 ENCOUNTER — Ambulatory Visit: Admission: RE | Admit: 2020-03-13 | Payer: Medicare HMO | Source: Home / Self Care | Admitting: Urology

## 2020-03-13 ENCOUNTER — Encounter: Admission: RE | Payer: Self-pay | Source: Home / Self Care

## 2020-03-13 SURGERY — CYSTOSCOPY, WITH BLADDER CALCULUS LITHOLAPAXY
Anesthesia: General

## 2020-03-20 ENCOUNTER — Ambulatory Visit: Payer: Medicare HMO | Admitting: Urology

## 2020-04-04 ENCOUNTER — Emergency Department (HOSPITAL_COMMUNITY): Payer: Medicare HMO

## 2020-04-04 ENCOUNTER — Emergency Department (HOSPITAL_COMMUNITY)
Admission: EM | Admit: 2020-04-04 | Discharge: 2020-04-04 | Disposition: A | Discharge status: Home / Self Care | Payer: Medicare HMO | Attending: Emergency Medicine | Admitting: Emergency Medicine

## 2020-04-04 ENCOUNTER — Encounter (HOSPITAL_COMMUNITY): Payer: Self-pay

## 2020-04-04 ENCOUNTER — Other Ambulatory Visit: Payer: Self-pay

## 2020-04-04 DIAGNOSIS — Z85828 Personal history of other malignant neoplasm of skin: Secondary | ICD-10-CM | POA: Diagnosis not present

## 2020-04-04 DIAGNOSIS — R0602 Shortness of breath: Secondary | ICD-10-CM | POA: Insufficient documentation

## 2020-04-04 DIAGNOSIS — Z79899 Other long term (current) drug therapy: Secondary | ICD-10-CM | POA: Insufficient documentation

## 2020-04-04 DIAGNOSIS — Y92410 Unspecified street and highway as the place of occurrence of the external cause: Secondary | ICD-10-CM | POA: Insufficient documentation

## 2020-04-04 DIAGNOSIS — R0789 Other chest pain: Secondary | ICD-10-CM | POA: Insufficient documentation

## 2020-04-04 DIAGNOSIS — R079 Chest pain, unspecified: Secondary | ICD-10-CM

## 2020-04-04 DIAGNOSIS — Z87891 Personal history of nicotine dependence: Secondary | ICD-10-CM | POA: Diagnosis not present

## 2020-04-04 DIAGNOSIS — I2510 Atherosclerotic heart disease of native coronary artery without angina pectoris: Secondary | ICD-10-CM | POA: Insufficient documentation

## 2020-04-04 DIAGNOSIS — R10819 Abdominal tenderness, unspecified site: Secondary | ICD-10-CM | POA: Diagnosis not present

## 2020-04-04 DIAGNOSIS — Z7982 Long term (current) use of aspirin: Secondary | ICD-10-CM | POA: Insufficient documentation

## 2020-04-04 LAB — URINALYSIS, ROUTINE W REFLEX MICROSCOPIC
Bilirubin Urine: NEGATIVE
Glucose, UA: NEGATIVE mg/dL
Ketones, ur: NEGATIVE mg/dL
Nitrite: NEGATIVE
Protein, ur: 100 mg/dL — AB
Specific Gravity, Urine: 1.016 (ref 1.005–1.030)
WBC, UA: >50 WBC/hpf — ABNORMAL HIGH (ref 0–5)
pH: 6 (ref 5.0–8.0)

## 2020-04-04 LAB — COMPREHENSIVE METABOLIC PANEL
ALT: 10 U/L (ref 0–44)
AST: 13 U/L — ABNORMAL LOW (ref 15–41)
Albumin: 3.8 g/dL (ref 3.5–5.0)
Alkaline Phosphatase: 57 U/L (ref 38–126)
Anion gap: 8 (ref 5–15)
BUN: 18 mg/dL (ref 8–23)
CO2: 24 mmol/L (ref 22–32)
Calcium: 8.6 mg/dL — ABNORMAL LOW (ref 8.9–10.3)
Chloride: 104 mmol/L (ref 98–111)
Creatinine, Ser: 1.4 mg/dL — ABNORMAL HIGH (ref 0.61–1.24)
GFR, Estimated: 51 mL/min — ABNORMAL LOW (ref >60)
Glucose, Bld: 95 mg/dL (ref 70–99)
Potassium: 4.4 mmol/L (ref 3.5–5.1)
Sodium: 136 mmol/L (ref 135–145)
Total Bilirubin: 0.9 mg/dL (ref 0.3–1.2)
Total Protein: 6.9 g/dL (ref 6.5–8.1)

## 2020-04-04 LAB — ETHANOL: Alcohol, Ethyl (B): <10 mg/dL (ref <10)

## 2020-04-04 LAB — PROTIME-INR
INR: 1 (ref 0.8–1.2)
Prothrombin Time: 13.1 seconds (ref 11.4–15.2)

## 2020-04-04 LAB — CBC
HCT: 49.6 % (ref 39.0–52.0)
Hemoglobin: 16.9 g/dL (ref 13.0–17.0)
MCH: 31.2 pg (ref 26.0–34.0)
MCHC: 34.1 g/dL (ref 30.0–36.0)
MCV: 91.5 fL (ref 80.0–100.0)
Platelets: 165 10*3/uL (ref 150–400)
RBC: 5.42 MIL/uL (ref 4.22–5.81)
RDW: 13.8 % (ref 11.5–15.5)
WBC: 9.4 10*3/uL (ref 4.0–10.5)
nRBC: 0 % (ref 0.0–0.2)

## 2020-04-04 LAB — SAMPLE TO BLOOD BANK

## 2020-04-04 LAB — LACTIC ACID, PLASMA: Lactic Acid, Venous: 1 mmol/L (ref 0.5–1.9)

## 2020-04-04 MED ORDER — MORPHINE SULFATE (PF) 2 MG/ML IV SOLN
2.0000 mg | Freq: Once | INTRAVENOUS | Status: AC
  Administered 2020-04-04: 2 mg via INTRAVENOUS
  Filled 2020-04-04: qty 1

## 2020-04-04 MED ORDER — ACETAMINOPHEN 500 MG PO TABS
1000.0000 mg | ORAL_TABLET | Freq: Once | ORAL | Status: AC
  Administered 2020-04-04: 1000 mg via ORAL
  Filled 2020-04-04: qty 2

## 2020-04-04 MED ORDER — IOHEXOL 300 MG/ML  SOLN
100.0000 mL | Freq: Once | INTRAMUSCULAR | Status: AC | PRN
  Administered 2020-04-04: 100 mL via INTRAVENOUS

## 2020-04-04 MED ORDER — DICLOFENAC SODIUM 1 % EX GEL: 4.0000 g | Freq: Four times a day (QID) | CUTANEOUS | 0 refills | Status: DC
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
## 2020-04-04 NOTE — ED Notes (Signed)
Contact #'s Wife Faiz Weber [phone], Son [phone]
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
## 2020-07-27 ENCOUNTER — Other Ambulatory Visit: Payer: Self-pay | Admitting: Cardiology

## 2020-08-02 ENCOUNTER — Encounter: Payer: Self-pay | Admitting: Urology

## 2020-08-02 ENCOUNTER — Other Ambulatory Visit: Payer: Self-pay

## 2020-08-02 ENCOUNTER — Ambulatory Visit (INDEPENDENT_AMBULATORY_CARE_PROVIDER_SITE_OTHER): Payer: Medicare HMO | Admitting: Urology

## 2020-08-02 VITALS — BP 146/56 | HR 69 | Temp 97.8°F | Ht 70.0 in | Wt 170.0 lb

## 2020-08-02 DIAGNOSIS — N3000 Acute cystitis without hematuria: Secondary | ICD-10-CM

## 2020-08-02 DIAGNOSIS — N210 Calculus in bladder: Secondary | ICD-10-CM

## 2020-08-02 DIAGNOSIS — N2889 Other specified disorders of kidney and ureter: Secondary | ICD-10-CM | POA: Diagnosis not present

## 2020-08-02 LAB — MICROSCOPIC EXAMINATION
Renal Epithel, UA: NONE SEEN /hpf
WBC, UA: >30 /hpf — AB (ref 0–5)

## 2020-08-02 LAB — URINALYSIS, ROUTINE W REFLEX MICROSCOPIC
Bilirubin, UA: NEGATIVE
Glucose, UA: NEGATIVE
Ketones, UA: NEGATIVE
Nitrite, UA: NEGATIVE
Specific Gravity, UA: 1.015 (ref 1.005–1.030)
Urobilinogen, Ur: 1 mg/dL (ref 0.2–1.0)
pH, UA: 5.5 (ref 5.0–7.5)

## 2020-08-02 NOTE — Progress Notes (Signed)
08/02/2020 11:53 AM   Angel [name] [birth year] [id number]  Referring provider: Bernerd Limbo, MD [address] [place],  [place] [id number]  followup BPH  HPI: [name] is a 80yo here for followup for bladder calculi, BPH and chronic cystitis. NO UTIs since last visit. He has not passed any bladder calculi since last visit. He continues to have moderate LUTS but does not want to proceed with TURP or cystolithalopaxy. He denies any recent gross hematuria. No other complaints today   PMH: Past Medical History:  Diagnosis Date  . Bladder stones   . CAD (coronary artery disease)    BMS RCA 8/11; LVEF 50-55%  . Cancer ([place])   . Deep vein thrombosis (DVT) ([place])   . Gall bladder stones   . GERD (gastroesophageal reflux disease)   . Gout   . History of kidney stones   . HLD (hyperlipidemia)   . HTN (hypertension)   . MI (myocardial infarction) ([place])    MSTEMI 8/11  . Pneumonia 2013  . Rash, skin    on lower legs  . Renal disorder    kidney growth  . 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 COLONOSCOPY    . COLONOSCOPY    . CORONARY ANGIOPLASTY  2010   Dr Johnsie Cancel  . CYSTOSCOPY WITH LITHOLAPAXY N/A 02/24/2017   Procedure: CYSTOSCOPY WITH LITHOLAPAXY;  Surgeon: Cleon Gustin, MD;  Location: [facility];  Service: Urology;  Laterality: N/A;  . CYSTOSCOPY WITH URETHRAL DILATATION  02/24/2017   Procedure: CYSTOSCOPY WITH URETHRAL DILATATION;  Surgeon: Cleon Gustin, MD;  Location: [facility];  Service: Urology;;  . Blenda Peals    . EAR CYST EXCISION Left 07/23/2012   Procedure: EXCISION LEFT EAR LOBE;  Surgeon: Melissa Montane, MD;  Location: [facility];  Service: ENT;  Laterality: Left;  . HOLMIUM LASER APPLICATION N/A 11/06/9483   Procedure: HOLMIUM LASER APPLICATION OF BLADDER CALCULI;  Surgeon: Cleon Gustin, MD;  Location: [facility];  Service: Urology;  Laterality: N/A;  . IR RADIOLOGIST EVAL & MGMT  11/28/2016    Home Medications:  Allergies as of 08/02/2020      Reactions   Ciprofloxacin Other (See Comments)   Crestor [rosuvastatin] Other (See Comments)   Unknown    Penicillins Other (See Comments)   Per patient causes gi upset with high doses. He says he does OK with shots but gets GI upset with PO penicillins Can take small doses with no issues   Tramadol Other (See Comments)   Confusion   Lisinopril Rash, Cough   Patient says it caused bp to increase and caused a rash on his legs    Macrodantin [nitrofurantoin] Rash      Medication List       Accurate as of August 02, 2020 11:53 AM. If you have any questions, ask your nurse or doctor.        STOP taking these medications   acetaminophen 500 MG tablet Commonly known as: TYLENOL Stopped by: Nicolette Bang, MD   ampicillin 500 MG capsule Commonly known as: PRINCIPEN Stopped by: Nicolette Bang, MD   diclofenac Sodium 1 % Gel Commonly known as: VOLTAREN Stopped by: Nicolette Bang, MD   silodosin 4 MG Caps capsule Commonly known as: Rapaflo Stopped by: Nicolette Bang, MD  TAKE these medications   amLODipine 5 MG tablet Commonly known as: NORVASC Take 5 mg by mouth daily.   aspirin EC 81 MG tablet Take 81 mg by mouth daily.   ibuprofen 200 MG tablet Commonly known as: ADVIL Take 400 mg by mouth every 6 (six) hours as needed for moderate pain.   nitroGLYCERIN 0.4 MG SL tablet Commonly known as: NITROSTAT PLACE 1 TAB UNDER TONGUE EVERY 5 MIN IF NEEDED FOR CHEST PAIN. MAY USE 3 TIMES.[employer] 911.       Allergies:  Allergies  Allergen Reactions  . Ciprofloxacin Other (See Comments)  . Crestor [Rosuvastatin] Other (See Comments)    Unknown   . Penicillins Other (See Comments)    Per patient causes gi upset with high doses. He says he does OK with shots but gets GI upset with PO  penicillins Can take small doses with no issues  . Tramadol Other (See Comments)    Confusion  . Lisinopril Rash and Cough    Patient says it caused bp to increase and caused a rash on his legs   . Macrodantin [Nitrofurantoin] Rash    Family History: History reviewed. No pertinent family history.  Social History:  reports that he quit smoking about 21 years ago. His smoking use included cigarettes. He has a 45.00 pack-year smoking history. He has never used smokeless tobacco. He reports that he does not drink alcohol and does not use drugs.  ROS: All other review of systems were reviewed and are negative except what is noted above in HPI  Physical Exam: BP (!) 146/56   Pulse 69   Temp 97.8 F (36.6 C)   Ht 5\' 10"  (1.778 m)   Wt 170 lb (77.1 kg)   BMI 24.39 kg/m   Constitutional:  Alert and oriented, No acute distress. HEENT: [place] AT, moist mucus membranes.  Trachea midline, no masses. Cardiovascular: No clubbing, cyanosis, or edema. Respiratory: Normal respiratory effort, no increased work of breathing. GI: Abdomen is soft, nontender, nondistended, no abdominal masses GU: No CVA tenderness.  Lymph: No cervical or inguinal lymphadenopathy. Skin: No rashes, bruises or suspicious lesions. Neurologic: Grossly intact, no focal deficits, moving all 4 extremities. Psychiatric: Normal mood and affect.  Laboratory Data: Lab Results  Component Value Date   WBC 9.4 04/04/2020   HGB 16.9 04/04/2020   HCT 49.6 04/04/2020   MCV 91.5 04/04/2020   PLT 165 04/04/2020    Lab Results  Component Value Date   CREATININE 1.40 (H) 04/04/2020    No results found for: PSA  No results found for: TESTOSTERONE  No results found for: HGBA1C  Urinalysis    Component Value Date/Time   COLORURINE YELLOW 04/04/2020 1925   APPEARANCEUR CLOUDY (A) 04/04/2020 1925   APPEARANCEUR Clear 02/07/2020 1037   LABSPEC 1.016 04/04/2020 1925   PHURINE 6.0 04/04/2020 1925   GLUCOSEU NEGATIVE  04/04/2020 1925   HGBUR MODERATE (A) 04/04/2020 1925   BILIRUBINUR NEGATIVE 04/04/2020 1925   BILIRUBINUR Negative 02/07/2020 1037   KETONESUR NEGATIVE 04/04/2020 1925   PROTEINUR 100 (A) 04/04/2020 1925   UROBILINOGEN negative (A) 07/07/2019 0913   UROBILINOGEN 0.2 07/25/2012 0446   NITRITE NEGATIVE 04/04/2020 1925   LEUKOCYTESUR LARGE (A) 04/04/2020 1925    Lab Results  Component Value Date   LABMICR See below: 02/07/2020   WBCUA >30 (A) 02/07/2020   LABEPIT None seen 02/07/2020   BACTERIA RARE (A) 04/04/2020    Pertinent Imaging:  No results found for this or  any previous visit.  No results found for this or any previous visit.  No results found for this or any previous visit.  No results found for this or any previous visit.  Results for orders placed during the hospital encounter of 09/22/18  US RENAL  Narrative CLINICAL DATA:  80 year old male with follow-up of right renal mass. Cystoscopy on October 2018.  EXAM: RENAL / URINARY TRACT ULTRASOUND COMPLETE  COMPARISON:  Renal ultrasound dated 02/03/2018 and CT dated 07/23/2017  FINDINGS: Right Kidney:  Renal measurements: 2.5 x 5.6 x 5.7 cm = volume: 209 mL. The right kidney demonstrates a normal echogenicity. No hydronephrosis or shadowing stone. There is a 4.8 x 3.7 x 4.9 cm (previously 4.3 x 3.9 x 4.4 cm) solid mass with increased echogenicity compared to the renal parenchyma arising from the inferior pole of the right kidney. Doppler images demonstrate some flow within this mass.  Left Kidney:  Renal measurements: 10.6 x 5.6 x 5.6 cm = volume: 173 mL. Normal echogenicity. There is a 6 mm nonobstructing inferior pole calculus. No hydronephrosis.  Bladder:  The urinary bladder is only partially distended and grossly unremarkable for the degree of distention. Ureteral jets are not visualized.  The prostate gland is enlarged measuring 5.7 x 5.2 x 6.0 cm for a volume of 95 cc.  IMPRESSION: 1.  Similar-sized or minimally increased size of the right renal inferior pole mass compared to the prior ultrasound. 2. A 6 mm nonobstructing left renal inferior pole calculus. No hydronephrosis or obstructing stone. 3. Enlarged prostate gland.   Electronically Signed By: Anner Crete M.D. On: 09/22/2018 20:38  No results found for this or any previous visit.  No results found for this or any previous visit.  No results found for this or any previous visit.   Assessment & Plan:    1. Acute cystitis without hematuria -urine for culture, will call with results - Urinalysis, Routine w reflex microscopic  2. Calculus of bladder -patient defers treatment at this time  3. Right renal mass -renal US in 6 months   No follow-ups on file.  Nicolette Bang, MD  [facility] [place]

## 2020-08-02 NOTE — Progress Notes (Signed)
Urological Symptom Review  Patient is experiencing the following symptoms: Frequent urination Get up at night to urinate   Review of Systems  Gastrointestinal (upper)  : Negative for upper GI symptoms  Gastrointestinal (lower) : Constipation  Constitutional : Negative for symptoms  Skin: Negative for skin symptoms  Eyes: Blurred vision  Ear/Nose/Throat : Negative for Ear/Nose/Throat symptoms  Hematologic/Lymphatic: Negative for Hematologic/Lymphatic symptoms  Cardiovascular : Negative for cardiovascular symptoms  Respiratory : Shortness of breath  Endocrine: Negative for endocrine symptoms  Musculoskeletal: Back pain  Neurological: Negative for neurological symptoms  Psychologic: Negative for psychiatric symptoms\ 3

## 2020-08-02 NOTE — Patient Instructions (Signed)
Bladder Stone  A bladder stone is a buildup of crystals made from the proteins and minerals found in urine. These substances build up when urine becomes too concentrated. Urine is concentrated when there is less water and more proteins and minerals in it. Bladder stones usually develop when a person has another medical condition that prevents the bladder from emptying completely. Crystals can form in the small amount of urine that is left in the bladder. Bladder stones that grow large can become painful and may block the flow of urine. What are the causes? This condition may be caused by:  An enlarged prostate, which prevents the bladder from emptying well.  An infection of a part of your urinary system (urinary tract infection, or UTI). This includes the: ? Kidneys. ? Bladder. ? Ureters. These are the tubes that carry urine to your bladder. ? Urethra. This is the tube that drains urine from your bladder.  A weak spot in the bladder that creates a small pouch (bladder diverticulum).  Nerve damage that may interfere with the signals from your brain to your bladder muscles (neurogenic bladder). This can result from conditions such as Parkinson's disease or spinal cord injuries. What increases the risk? This condition is more likely to develop in people who:  Get frequent UTIs.  Have another medical condition that affects the bladder.  Have a history of bladder surgery.  Have a spinal cord injury.  Have an abnormal shape of the bladder (deformity). What are the signs or symptoms? Common symptoms of this condition include:  Pain in the abdomen.  A need to urinate more often.  Difficulty or pain when urinating.  Blood in the urine.  Cloudy urine or urine that is dark in color.  Pain in the penis or testicles in men. Small bladder stones do not always cause symptoms. How is this diagnosed? This condition may be diagnosed based on your symptoms, medical history, and physical  exam. The physical exam will check for tenderness in your abdomen. For men, an exam in the rectum may be done to check the prostate gland.  You may have tests, such as: ? A urine test (urinalysis). ? A urine sample test to check for other infections (culture). ? Blood tests, including tests to look for a certain substance (creatinine). A creatinine level that is higher than normal could indicate a blockage. ? A procedure to check your bladder using a scope with a camera (cystoscopy).  You may also have imaging studies, such as: ? CT scan or ultrasound of your abdomen and the area between your hip bones (pelvis or pelvic area). ? An X-ray of your urinary system. How is this treated? This condition may be treated with:  Cystolitholapaxy. This procedure uses a laser, ultrasound, or other device to break the stone into smaller pieces. Fluids are used to flush the small pieces from the area.  Surgery to remove the stone.  A stent. This is a small mesh tube that is threaded into your ureter to make urine flow.  Medicines to treat pain. Follow these instructions at home: Medicines  Take over-the-counter and prescription medicines only as told by your health care provider.  Ask your health care provider if the medicine prescribed to you: ? Requires you to avoid driving or using heavy machinery. ? Can cause constipation. You may need to take these actions to prevent or treat constipation:  Take over-the-counter or prescription medicines.  Eat foods that are high in fiber, such as beans, whole  grains, and fresh fruits and vegetables.  Limit foods that are high in fat and processed sugars, such as fried or sweet foods. Alcohol use  Do not drink alcohol if: ? Your health care provider tells you not to drink. ? You are pregnant, may be pregnant, or are planning to become pregnant.  If you drink alcohol: ? Limit how much you drink to:  0-1 drink a day for women.  0-2 drinks a day for  men. ? Be aware of how much alcohol is in your drink. In the [place] one drink equals one 12 oz bottle of beer (355 mL), one 5 oz glass of wine (148 mL), or one 1 oz glass of hard liquor (44 mL). Activity  Rest as told by your health care provider.  Return to your normal activities as told by your health care provider. Ask your health care provider what activities are safe for you. General instructions  Drink enough fluid to keep your urine pale yellow.  Tell your health care provider about any unusual symptoms related to urinating. Early diagnosis of an enlarged prostate and other bladder conditions may reduce your risk of getting bladder stones.  Do not use any products that contain nicotine or tobacco, such as cigarettes, e-cigarettes, or chewing tobacco. If you need help quitting, ask your health care provider.  Do not use drugs.   Where to find more information [facility] [place] [facility]): www.urologyhealth.org Contact a health care provider if you:  Have a fever.  Feel nauseous or vomit.  Are unable to urinate.  Have a large amount of blood in your urine. Get help right away if you:  Have severe back pain or pain in the lower part of your abdomen.  Cannot eat or drink without vomiting.  Vomit after taking your medicine. Summary  A bladder stone is a buildup of crystals made from the proteins and minerals found in urine. These substances build up when urine becomes too concentrated.  Bladder stones that grow large can become painful and may block the flow of urine.  Bladder stones may be treated with a laser, a stent, surgery, or pain medicines. This information is not intended to replace advice given to you by your health care provider. Make sure you discuss any questions you have with your health care provider. Document Revised: 11/19/2018 Document Reviewed: 11/19/2018 Elsevier Patient Education  [place].

## 2020-08-06 LAB — URINE CULTURE
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
## 2020-08-08 ENCOUNTER — Other Ambulatory Visit: Payer: Self-pay

## 2020-08-08 ENCOUNTER — Ambulatory Visit (INDEPENDENT_AMBULATORY_CARE_PROVIDER_SITE_OTHER): Payer: Medicare HMO | Admitting: Family Medicine

## 2020-08-08 ENCOUNTER — Telehealth: Payer: Self-pay

## 2020-08-08 ENCOUNTER — Encounter: Payer: Self-pay | Admitting: Family Medicine

## 2020-08-08 VITALS — BP 134/62 | HR 55 | Ht 70.0 in | Wt 164.4 lb

## 2020-08-08 DIAGNOSIS — R0602 Shortness of breath: Secondary | ICD-10-CM | POA: Diagnosis not present

## 2020-08-08 DIAGNOSIS — N2889 Other specified disorders of kidney and ureter: Secondary | ICD-10-CM

## 2020-08-08 DIAGNOSIS — I2510 Atherosclerotic heart disease of native coronary artery without angina pectoris: Secondary | ICD-10-CM

## 2020-08-08 DIAGNOSIS — I10 Essential (primary) hypertension: Secondary | ICD-10-CM | POA: Diagnosis not present

## 2020-08-08 NOTE — Telephone Encounter (Signed)
-----   Message from Cleon Gustin, MD sent at 08/08/2020 10:38 AM EDT ----- Macrobid 100mg  BID for 7 days ----- Message ----- From: Valentina Lucks, LPN Sent: 8/59/0931   9:00 AM EDT To: Cleon Gustin, MD  Pls review.

## 2020-08-08 NOTE — Patient Instructions (Signed)
Medication Instructions:  Continue all current medications.  Labwork: none  Testing/Procedures:  Your physician has requested that you have an echocardiogram. Echocardiography is a painless test that uses sound waves to create images of your heart. It provides your doctor with information about the size and shape of your heart and how well your heart's chambers and valves are working. This procedure takes approximately one hour. There are no restrictions for this procedure.  Office will contact with results via phone or letter.    Follow-Up: 4-6 weeks   Any Other Special Instructions Will Be Listed Below (If Applicable).  If you need a refill on your cardiac medications before your next appointment, please call your pharmacy.

## 2020-08-09 ENCOUNTER — Other Ambulatory Visit: Payer: Self-pay

## 2020-08-09 DIAGNOSIS — N390 Urinary tract infection, site not specified: Secondary | ICD-10-CM

## 2020-08-09 DIAGNOSIS — N3000 Acute cystitis without hematuria: Secondary | ICD-10-CM

## 2020-08-09 MED ORDER — AMOXICILLIN 500 MG PO TABS: 500.0000 mg | ORAL_TABLET | Freq: Two times a day (BID) | ORAL | 0 refills | Status: DC

## 2020-08-09 MED ORDER — DOXYCYCLINE HYCLATE 100 MG PO CAPS: 100.0000 mg | ORAL_CAPSULE | Freq: Two times a day (BID) | ORAL | 0 refills | Status: DC

## 2020-08-09 NOTE — Progress Notes (Signed)
Pt called back about his urine culture result. I had sent a task to Dr. Alyson Ingles because he had prescribed Macrobid and pt had allergy. I spoke with him and he prescribed Doxycycline instead. Prescription sent and pt notified.

## 2020-08-09 NOTE — Progress Notes (Signed)
See prior task.

## 2020-08-09 NOTE — Progress Notes (Signed)
Pt came into office with filled doxycycline prescription saying he read side effects and doesn't want to take it. I spoke with Dr. Alyson Ingles and he said to send Amoxicillin instead. Pt was notified and new prescription sent.

## 2020-08-10 NOTE — Telephone Encounter (Signed)
Please give amoxicillin 500mg  BID for 7 days

## 2020-08-11 NOTE — Telephone Encounter (Signed)
This has been done.

## 2020-08-29 ENCOUNTER — Ambulatory Visit (HOSPITAL_COMMUNITY)
Admission: RE | Admit: 2020-08-29 | Discharge: 2020-08-29 | Disposition: A | Discharge status: Home / Self Care | Payer: Medicare HMO | Source: Ambulatory Visit | Attending: Family Medicine | Admitting: Family Medicine

## 2020-08-29 ENCOUNTER — Other Ambulatory Visit: Payer: Self-pay

## 2020-08-29 DIAGNOSIS — R0602 Shortness of breath: Secondary | ICD-10-CM | POA: Diagnosis not present

## 2020-08-29 DIAGNOSIS — I351 Nonrheumatic aortic (valve) insufficiency: Secondary | ICD-10-CM

## 2020-08-29 DIAGNOSIS — I361 Nonrheumatic tricuspid (valve) insufficiency: Secondary | ICD-10-CM

## 2020-08-29 LAB — ECHOCARDIOGRAM COMPLETE
AR max vel: 2.27 cm2
AV Area VTI: 2.47 cm2
AV Area mean vel: 2.04 cm2
AV Mean grad: 4.1 mmHg
AV Peak grad: 8.4 mmHg
Ao pk vel: 1.45 m/s
Area-P 1/2: 2.53 cm2
P 1/2 time: 584 msec
S' Lateral: 3.52 cm

## 2020-08-29 NOTE — Progress Notes (Signed)
  Echocardiogram 2D Echocardiogram has been performed.  Angel [name] 08/29/2020, 10:49 AM
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
## 2020-08-31 ENCOUNTER — Encounter: Payer: Self-pay | Admitting: Family Medicine

## 2020-08-31 ENCOUNTER — Other Ambulatory Visit (HOSPITAL_COMMUNITY)
Admission: RE | Admit: 2020-08-31 | Discharge: 2020-08-31 | Disposition: A | Discharge status: Home / Self Care | Payer: Medicare HMO | Source: Ambulatory Visit | Attending: Family Medicine | Admitting: Family Medicine

## 2020-08-31 ENCOUNTER — Other Ambulatory Visit: Payer: Self-pay

## 2020-08-31 ENCOUNTER — Ambulatory Visit (INDEPENDENT_AMBULATORY_CARE_PROVIDER_SITE_OTHER): Payer: Medicare HMO | Admitting: Family Medicine

## 2020-08-31 VITALS — BP 132/68 | HR 70 | Ht 70.0 in | Wt 160.0 lb

## 2020-08-31 DIAGNOSIS — R0602 Shortness of breath: Secondary | ICD-10-CM | POA: Diagnosis present

## 2020-08-31 DIAGNOSIS — I272 Pulmonary hypertension, unspecified: Secondary | ICD-10-CM

## 2020-08-31 DIAGNOSIS — I10 Essential (primary) hypertension: Secondary | ICD-10-CM | POA: Diagnosis not present

## 2020-08-31 DIAGNOSIS — I2510 Atherosclerotic heart disease of native coronary artery without angina pectoris: Secondary | ICD-10-CM

## 2020-08-31 LAB — D-DIMER, QUANTITATIVE: D-Dimer, Quant: 0.74 ug/mL-FEU — ABNORMAL HIGH (ref 0.00–0.50)

## 2020-08-31 NOTE — Patient Instructions (Addendum)
Medication Instructions:  Continue all current medications.  Labwork:  D-Dimer - order given today.   Office will contact with results via phone or letter.    Testing/Procedures: none  Follow-Up: 3 months   Any Other Special Instructions Will Be Listed Below (If Applicable). You have been referred to:  CHF clinic  If you need a refill on your cardiac medications before your next appointment, please call your pharmacy.

## 2020-09-04 ENCOUNTER — Telehealth: Payer: Self-pay | Admitting: *Deleted

## 2020-09-04 NOTE — Telephone Encounter (Signed)
Laurine Blazer, LPN  9/43/2761 4:70 PM EDT Back to Top     Notified, copy to pcp.    Laurine Blazer, LPN  02/08/5746 3:40 PM EDT      Left message to return call.   Verta Ellen., NP  08/31/2020 5:06 PM EDT      His D-dimer is elevated but based on age-adjusted range it is within normal range. Given his recent improvement in symptoms he does not need a CT to evaluate for PE at this time. Make sure he sees advanced heart failure clinic for severe pulmonary hypertension. Thank you

## 2020-09-25 ENCOUNTER — Emergency Department (HOSPITAL_COMMUNITY): Payer: Medicare HMO

## 2020-09-25 ENCOUNTER — Encounter (HOSPITAL_COMMUNITY): Payer: Self-pay | Admitting: *Deleted

## 2020-09-25 ENCOUNTER — Other Ambulatory Visit: Payer: Self-pay

## 2020-09-25 ENCOUNTER — Emergency Department (HOSPITAL_COMMUNITY)
Admission: EM | Admit: 2020-09-25 | Discharge: 2020-09-25 | Disposition: A | Discharge status: Home / Self Care | Payer: Medicare HMO | Attending: Emergency Medicine | Admitting: Emergency Medicine

## 2020-09-25 DIAGNOSIS — R0789 Other chest pain: Secondary | ICD-10-CM | POA: Insufficient documentation

## 2020-09-25 DIAGNOSIS — R0989 Other specified symptoms and signs involving the circulatory and respiratory systems: Secondary | ICD-10-CM | POA: Diagnosis present

## 2020-09-25 DIAGNOSIS — I2510 Atherosclerotic heart disease of native coronary artery without angina pectoris: Secondary | ICD-10-CM | POA: Insufficient documentation

## 2020-09-25 DIAGNOSIS — Z87891 Personal history of nicotine dependence: Secondary | ICD-10-CM | POA: Diagnosis not present

## 2020-09-25 DIAGNOSIS — I10 Essential (primary) hypertension: Secondary | ICD-10-CM | POA: Insufficient documentation

## 2020-09-25 DIAGNOSIS — X58XXXA Exposure to other specified factors, initial encounter: Secondary | ICD-10-CM | POA: Diagnosis not present

## 2020-09-25 DIAGNOSIS — Z79899 Other long term (current) drug therapy: Secondary | ICD-10-CM | POA: Diagnosis not present

## 2020-09-25 DIAGNOSIS — Z20822 Contact with and (suspected) exposure to covid-19: Secondary | ICD-10-CM | POA: Diagnosis not present

## 2020-09-25 DIAGNOSIS — Z7982 Long term (current) use of aspirin: Secondary | ICD-10-CM | POA: Diagnosis not present

## 2020-09-25 DIAGNOSIS — T18128A Food in esophagus causing other injury, initial encounter: Secondary | ICD-10-CM | POA: Insufficient documentation

## 2020-09-25 LAB — CBC WITH DIFFERENTIAL/PLATELET
Abs Immature Granulocytes: 0.04 10*3/uL (ref 0.00–0.07)
Basophils Absolute: 0 10*3/uL (ref 0.0–0.1)
Basophils Relative: 1 %
Eosinophils Absolute: 0.1 10*3/uL (ref 0.0–0.5)
Eosinophils Relative: 1 %
HCT: 53 % — ABNORMAL HIGH (ref 39.0–52.0)
Hemoglobin: 17.9 g/dL — ABNORMAL HIGH (ref 13.0–17.0)
Immature Granulocytes: 1 %
Lymphocytes Relative: 18 %
Lymphs Abs: 1.3 10*3/uL (ref 0.7–4.0)
MCH: 30.5 pg (ref 26.0–34.0)
MCHC: 33.8 g/dL (ref 30.0–36.0)
MCV: 90.3 fL (ref 80.0–100.0)
Monocytes Absolute: 0.3 10*3/uL (ref 0.1–1.0)
Monocytes Relative: 4 %
Neutro Abs: 5.6 10*3/uL (ref 1.7–7.7)
Neutrophils Relative %: 75 %
Platelets: 163 10*3/uL (ref 150–400)
RBC: 5.87 MIL/uL — ABNORMAL HIGH (ref 4.22–5.81)
RDW: 14 % (ref 11.5–15.5)
WBC: 7.4 10*3/uL (ref 4.0–10.5)
nRBC: 0 % (ref 0.0–0.2)

## 2020-09-25 LAB — COMPREHENSIVE METABOLIC PANEL
ALT: 8 U/L (ref 0–44)
AST: 14 U/L — ABNORMAL LOW (ref 15–41)
Albumin: 4.2 g/dL (ref 3.5–5.0)
Alkaline Phosphatase: 71 U/L (ref 38–126)
Anion gap: 8 (ref 5–15)
BUN: 17 mg/dL (ref 8–23)
CO2: 22 mmol/L (ref 22–32)
Calcium: 9.3 mg/dL (ref 8.9–10.3)
Chloride: 107 mmol/L (ref 98–111)
Creatinine, Ser: 1.65 mg/dL — ABNORMAL HIGH (ref 0.61–1.24)
GFR, Estimated: 42 mL/min — ABNORMAL LOW (ref >60)
Glucose, Bld: 100 mg/dL — ABNORMAL HIGH (ref 70–99)
Potassium: 4.3 mmol/L (ref 3.5–5.1)
Sodium: 137 mmol/L (ref 135–145)
Total Bilirubin: 1.7 mg/dL — ABNORMAL HIGH (ref 0.3–1.2)
Total Protein: 8 g/dL (ref 6.5–8.1)

## 2020-09-25 LAB — RESP PANEL BY RT-PCR (FLU A&B, COVID) ARPGX2
Influenza A by PCR: NEGATIVE
Influenza B by PCR: NEGATIVE
SARS Coronavirus 2 by RT PCR: NEGATIVE

## 2020-09-25 LAB — CBG MONITORING, ED: Glucose-Capillary: 96 mg/dL (ref 70–99)

## 2020-09-25 LAB — TROPONIN I (HIGH SENSITIVITY)
Troponin I (High Sensitivity): 7 ng/L (ref <18)
Troponin I (High Sensitivity): 8 ng/L (ref <18)

## 2020-09-25 LAB — LIPASE, BLOOD: Lipase: 35 U/L (ref 11–51)

## 2020-09-25 MED ORDER — GLUCAGON HCL RDNA (DIAGNOSTIC) 1 MG IJ SOLR
1.0000 mg | Freq: Once | INTRAMUSCULAR | Status: AC
  Administered 2020-09-25: 1 mg via INTRAVENOUS
  Filled 2020-09-25: qty 1

## 2020-09-25 NOTE — ED Triage Notes (Signed)
Pt states today after eating around 1230-1p he has been having a choking sensation and nothing will go down.  If he drinks something it comes right back up.  Pt was eating a hamburger.  Pt states hurts down in his mid chest. Pt has history of cardiac stent and had similar symptoms. Pt is having some drooling and speaking in normal sentences

## 2020-09-25 NOTE — Discharge Instructions (Addendum)
Continue taking home medications as prescribed.  Follow-up with your primary care doctor for recheck of your symptoms. Return to the emergency room if you develop chest pain, choking sensation, persistent vomiting, increased shortness of breath, or any new, sudden, concerning symptoms

## 2020-09-25 NOTE — ED Provider Notes (Signed)
[facility] EMERGENCY DEPARTMENT Provider Note   CSN: [id number] Arrival date & time: 09/25/20  1624     History Chief Complaint  Patient presents with  . Chest Pain  . Choking    Angel [name] is a 80 y.o. male presenting for evaluation of feeling like he is being choked. Patient states around 1:00 this afternoon he was eating a sandwich when he felt something get stuck in his throat.  Since then, he has had the feeling that he is being choked.  He has been unable to tolerate any p.o. since then.  He reports he has had similar symptoms before, but never anything that has lasted this long.  He has had previous heart problems, but states this feels different.  He has chronic shortness of breath, unchanged today.  He states that as soon as he eats or drinks, he immediately has vomiting, it is not delayed.  No fevers or chills.  No urinary symptoms or abnormal bowel movements.  He has seen a GI doctor in [facility] before for colonoscopy, had no other GI evaluation.  Additional history obtained from chart review.  Patient with a history of CAD, GERD, hypertension, hyperlipidemia  HPI     Past Medical History:  Diagnosis Date  . Bladder stones   . CAD (coronary artery disease)    BMS RCA 8/11; LVEF 50-55%  . Cancer ([place])   . Deep vein thrombosis (DVT) ([place])   . Gall bladder stones   . GERD (gastroesophageal reflux disease)   . Gout   . History of kidney stones   . HLD (hyperlipidemia)   . HTN (hypertension)   . MI (myocardial infarction) ([place])    MSTEMI 8/11  . Pneumonia 2013  . Rash, skin    on lower legs  . Renal disorder    kidney growth  . 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 Acute cystitis without hematuria 07/07/2019  . Calculus of bladder 07/07/2019  . BPH (benign prostatic hyperplasia) 02/24/2017  . ABDOMINAL  PAIN-GENERALIZED 02/16/2010  . ESSENTIAL HYPERTENSION, BENIGN 02/02/2010  . ELEVATED BLOOD PRESSURE 01/31/2010  . MIXED HYPERLIPIDEMIA 01/18/2010  . SMOKER 01/18/2010  . CORONARY ATHEROSCLEROSIS NATIVE CORONARY ARTERY 01/18/2010  . FATIGUE 01/18/2010    Past Surgical History:  Procedure Laterality Date  . COLONOSCOPY    . COLONOSCOPY    . CORONARY ANGIOPLASTY  2010   Dr Johnsie Cancel  . CYSTOSCOPY WITH LITHOLAPAXY N/A 02/24/2017   Procedure: CYSTOSCOPY WITH LITHOLAPAXY;  Surgeon: Cleon Gustin, MD;  Location: [facility];  Service: Urology;  Laterality: N/A;  . CYSTOSCOPY WITH URETHRAL DILATATION  02/24/2017   Procedure: CYSTOSCOPY WITH URETHRAL DILATATION;  Surgeon: Cleon Gustin, MD;  Location: [facility];  Service: Urology;;  . Blenda Peals    . EAR CYST EXCISION Left 07/23/2012   Procedure: EXCISION LEFT EAR LOBE;  Surgeon: Melissa Montane, MD;  Location: [place];  Service: ENT;  Laterality: Left;  . HOLMIUM LASER APPLICATION N/A 05/15/7251   Procedure: HOLMIUM LASER APPLICATION OF BLADDER CALCULI;  Surgeon: Cleon Gustin, MD;  Location: [facility];  Service: Urology;  Laterality: N/A;  . IR RADIOLOGIST EVAL & MGMT  11/28/2016       No family history on file.  Social History   Tobacco Use  .  Smoking status: Former Smoker    Packs/day: 1.50    Years: 30.00    Pack years: 45.00    Types: Cigarettes    Quit date: 05/14/1999    Years since quitting: 21.3  . Smokeless tobacco: Never Used  . Tobacco comment: quit 9/11  Vaping Use  . Vaping Use: Never used  Substance Use Topics  . Alcohol use: No  . Drug use: No    Home Medications Prior to Admission medications   Medication Sig Start Date End Date Taking? Authorizing Provider  acetaminophen (TYLENOL) 500 MG tablet Take 250 mg by mouth every 6 (six) hours as needed for moderate pain or headache.   Yes [provider]  amLODipine (NORVASC) 5 MG tablet Take 5 mg by mouth daily.   Yes [provider]   aspirin EC 81 MG tablet Take 81 mg by mouth daily.   Yes [provider]  ibuprofen (ADVIL) 200 MG tablet Take 200 mg by mouth every 6 (six) hours as needed for moderate pain.   Yes [provider]  nitroGLYCERIN (NITROSTAT) 0.4 MG SL tablet PLACE 1 TAB UNDER TONGUE EVERY 5 MIN IF NEEDED FOR CHEST PAIN. MAY USE 3 TIMES.[place] 911. Patient taking differently: Place 0.4 mg under the tongue every 5 (five) minutes as needed for chest pain. 07/27/20  Yes BranchAlphonse Guild, MD    Allergies    Ciprofloxacin, Crestor [rosuvastatin], Penicillins, Tramadol, Lisinopril, and Macrodantin [nitrofurantoin]  Review of Systems   Review of Systems  HENT: Positive for trouble swallowing.   Cardiovascular: Positive for chest pain.  Gastrointestinal: Positive for nausea and vomiting.  All other systems reviewed and are negative.   Physical Exam Updated Vital Signs BP (!) 152/67 (BP Location: Right Arm)   Pulse 68   Temp (!) 97.5 F (36.4 C) (Oral)   Resp 19   SpO2 91%   Physical Exam Vitals and nursing note reviewed.  Constitutional:      General: He is not in acute distress.    Appearance: He is well-developed.     Comments: Appears nontoxic  HENT:     Head: Normocephalic and atraumatic.     Comments: OP clear.  Handling secretions without difficulty. Eyes:     Conjunctiva/sclera: Conjunctivae normal.     Pupils: Pupils are equal, round, and reactive to light.  Cardiovascular:     Rate and Rhythm: Normal rate and regular rhythm.     Pulses: Normal pulses.  Pulmonary:     Effort: Pulmonary effort is normal. No respiratory distress.     Breath sounds: Normal breath sounds. No wheezing.  Abdominal:     General: There is no distension.     Palpations: Abdomen is soft. There is no mass.     Tenderness: There is no abdominal tenderness. There is no guarding or rebound.     Comments: No TTP of the abdomen  Musculoskeletal:        General: Normal range of motion.      Cervical back: Normal range of motion and neck supple.  Skin:    General: Skin is warm and dry.     Capillary Refill: Capillary refill takes less than 2 seconds.  Neurological:     Mental Status: He is alert and oriented to person, place, and time.     ED Results / Procedures / Treatments   Labs (all labs ordered are listed, but only abnormal results are displayed) Labs Reviewed  CBC WITH DIFFERENTIAL/PLATELET - Abnormal;  Notable for the following components:      Result Value   RBC 5.87 (*)    Hemoglobin 17.9 (*)    HCT 53.0 (*)    All other components within normal limits  COMPREHENSIVE METABOLIC PANEL - Abnormal; Notable for the following components:   Glucose, Bld 100 (*)    Creatinine, Ser 1.65 (*)    AST 14 (*)    Total Bilirubin 1.7 (*)    GFR, Estimated 42 (*)    All other components within normal limits  RESP PANEL BY RT-PCR (FLU A&B, COVID) ARPGX2  LIPASE, BLOOD  CBG MONITORING, ED  TROPONIN I (HIGH SENSITIVITY)  TROPONIN I (HIGH SENSITIVITY)    EKG EKG Interpretation  Date/Time:  Monday Sep 25 2020 19:34:20 EDT Ventricular Rate:  74 PR Interval:  142 QRS Duration: 102 QT Interval:  412 QTC Calculation: 458 R Axis:   73 Text Interpretation: Sinus rhythm LAE, consider biatrial enlargement Confirmed by Lavenia Atlas [phone]) on 09/25/2020 7:49:29 PM   Radiology DG Chest Portable 1 View  Result Date: 09/25/2020 CLINICAL DATA:  Chest pain and shortness of breath EXAM: PORTABLE CHEST 1 VIEW COMPARISON:  04/05/2019 FINDINGS: Cardiac shadow is stable. The lungs are well aerated bilaterally. No focal infiltrate or sizable effusion is seen. No bony abnormality is noted. IMPRESSION: No active disease. Electronically Signed   By: Inez Catalina M.D.   On: 09/25/2020 20:07    Procedures Procedures   Medications Ordered in ED Medications  glucagon (human recombinant) (GLUCAGEN) injection 1 mg (1 mg Intravenous Given 09/25/20 2041)    ED Course  I have reviewed  the triage vital signs and the nursing notes.  Pertinent labs & imaging results that were available during my care of the patient were reviewed by me and considered in my medical decision making (see chart for details).    MDM Rules/Calculators/A&P                          Patient presenting for evaluation of feeling like he is being choked by food.  On exam, patient appears nontoxic.  He is handling secretions.  He does report a cardiac history, but states this is different than his previous.  He has been unable to tolerate p.o. since this happened.  Will obtain labs, EKG, chest x-ray and trial glucagon for likely food bolus.  Labs interpreted by me, overall reassuring.  Initial troponin is negative.  Will obtain repeat as patient does have significant cardiac history.  Initial EKG difficult to interpret due to artifact, question biphasic T waves.  However on repeat, this has resolved and is much more reassuring without signs of acute ischemia.  On reevaluation after glucagon, patient reports improvement of his symptoms.  Will trial p.o.  Patient able to tolerate p.o. without difficulty.  He was able to swallow one of his home amlodipine pills.  Repeat troponin negative.  Patient continues to report improvement of symptoms.  I have low suspicion for cardiac cause, likely food bolus which is passed.  Discussed continued close monitoring and follow-up with PCP for recheck of symptoms.  At this time, patient appears safe for discharge.  Return precautions given.  Patient states he understands and agrees to plan.  Final Clinical Impression(s) / ED Diagnoses Final diagnoses:  Food impaction of esophagus, initial encounter  Atypical chest pain    Rx / DC Orders ED Discharge Orders    None  Franchot Heidelberg, PA-C 09/25/20 2321    Lorelle Gibbs, DO 09/26/20 2337

## 2020-09-25 NOTE — ED Notes (Signed)
Pts O2 saturation stable on room air at time of discharge. No difficulty breathing noted.

## 2020-09-25 NOTE — ED Notes (Signed)
RN aware of O2 sat.

## 2020-09-25 NOTE — ED Notes (Signed)
Pt found to be hypoxic in the low 80's. Placed on 2L O2. Sophia, PA made aware.

## 2020-10-12 ENCOUNTER — Other Ambulatory Visit: Payer: Self-pay

## 2020-10-12 ENCOUNTER — Ambulatory Visit (HOSPITAL_COMMUNITY)
Admission: RE | Admit: 2020-10-12 | Discharge: 2020-10-12 | Disposition: A | Discharge status: Home / Self Care | Payer: Medicare HMO | Source: Ambulatory Visit | Attending: Internal Medicine | Admitting: Internal Medicine

## 2020-10-12 ENCOUNTER — Encounter (HOSPITAL_COMMUNITY): Payer: Self-pay | Admitting: Internal Medicine

## 2020-10-12 VITALS — BP 130/50 | HR 61 | Wt 156.8 lb

## 2020-10-12 DIAGNOSIS — Z791 Long term (current) use of non-steroidal anti-inflammatories (NSAID): Secondary | ICD-10-CM | POA: Diagnosis not present

## 2020-10-12 DIAGNOSIS — Z7982 Long term (current) use of aspirin: Secondary | ICD-10-CM | POA: Diagnosis not present

## 2020-10-12 DIAGNOSIS — Z79899 Other long term (current) drug therapy: Secondary | ICD-10-CM | POA: Diagnosis not present

## 2020-10-12 DIAGNOSIS — I252 Old myocardial infarction: Secondary | ICD-10-CM | POA: Diagnosis not present

## 2020-10-12 DIAGNOSIS — I10 Essential (primary) hypertension: Secondary | ICD-10-CM | POA: Insufficient documentation

## 2020-10-12 DIAGNOSIS — J9611 Chronic respiratory failure with hypoxia: Secondary | ICD-10-CM | POA: Diagnosis not present

## 2020-10-12 DIAGNOSIS — J449 Chronic obstructive pulmonary disease, unspecified: Secondary | ICD-10-CM | POA: Diagnosis not present

## 2020-10-12 DIAGNOSIS — N2889 Other specified disorders of kidney and ureter: Secondary | ICD-10-CM | POA: Diagnosis not present

## 2020-10-12 DIAGNOSIS — Z86718 Personal history of other venous thrombosis and embolism: Secondary | ICD-10-CM | POA: Insufficient documentation

## 2020-10-12 DIAGNOSIS — R918 Other nonspecific abnormal finding of lung field: Secondary | ICD-10-CM | POA: Diagnosis not present

## 2020-10-12 DIAGNOSIS — I2510 Atherosclerotic heart disease of native coronary artery without angina pectoris: Secondary | ICD-10-CM | POA: Insufficient documentation

## 2020-10-12 DIAGNOSIS — Z87891 Personal history of nicotine dependence: Secondary | ICD-10-CM | POA: Diagnosis not present

## 2020-10-12 DIAGNOSIS — Z955 Presence of coronary angioplasty implant and graft: Secondary | ICD-10-CM | POA: Diagnosis not present

## 2020-10-12 DIAGNOSIS — E785 Hyperlipidemia, unspecified: Secondary | ICD-10-CM | POA: Diagnosis not present

## 2020-10-12 DIAGNOSIS — I272 Pulmonary hypertension, unspecified: Secondary | ICD-10-CM | POA: Insufficient documentation

## 2020-10-12 NOTE — Patient Instructions (Signed)
Your physician recommends you have a VQ Scan and chest x-ray  Your physician has recommended that you have a pulmonary function test. Pulmonary Function Tests are a group of tests that measure how well air moves in and out of your lungs.  You have been referred to [facility] in [place], they will call you for an appointment  Please call our office in November 2022 to schedule your follow up appointment  If you have any questions or concerns before your next appointment please send [place] a message through [place] or call our office at [phone].    TO LEAVE A MESSAGE FOR THE NURSE SELECT OPTION 2, PLEASE LEAVE A MESSAGE INCLUDING: . YOUR NAME . DATE OF BIRTH . CALL BACK NUMBER . REASON FOR CALL**this is important as we prioritize the call backs  [place] AS LONG AS YOU CALL BEFORE 4:00 PM  At the [facility], you and your health needs are our priority. As part of our continuing mission to provide you with exceptional heart care, we have created designated Provider Care Teams. These Care Teams include your primary Cardiologist (physician) and Advanced Practice Providers (APPs- Physician Assistants and Nurse Practitioners) who all work together to provide you with the care you need, when you need it.   You may see any of the following providers on your designated Care Team at your next follow up: Marland Kitchen Dr [name] . Dr Loralie Champagne . Dr Vickki Muff . Darrick Grinder, NP . Lyda Jester, [place] . Audry Riles, PharmD   Please be sure to bring in all your medications bottles to every appointment.
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
## 2020-10-16 ENCOUNTER — Other Ambulatory Visit (HOSPITAL_COMMUNITY)
Admission: RE | Admit: 2020-10-16 | Discharge: 2020-10-16 | Disposition: A | Discharge status: Home / Self Care | Payer: Medicare HMO | Source: Ambulatory Visit | Attending: Internal Medicine | Admitting: Internal Medicine

## 2020-10-16 ENCOUNTER — Other Ambulatory Visit: Payer: Self-pay

## 2020-10-16 DIAGNOSIS — Z01812 Encounter for preprocedural laboratory examination: Secondary | ICD-10-CM | POA: Diagnosis present

## 2020-10-16 DIAGNOSIS — Z20822 Contact with and (suspected) exposure to covid-19: Secondary | ICD-10-CM | POA: Diagnosis not present

## 2020-10-17 ENCOUNTER — Ambulatory Visit (HOSPITAL_COMMUNITY)
Admission: RE | Admit: 2020-10-17 | Discharge: 2020-10-17 | Disposition: A | Discharge status: Home / Self Care | Payer: Medicare HMO | Source: Ambulatory Visit | Attending: Internal Medicine | Admitting: Internal Medicine

## 2020-10-17 DIAGNOSIS — I272 Pulmonary hypertension, unspecified: Secondary | ICD-10-CM | POA: Insufficient documentation

## 2020-10-17 LAB — SARS CORONAVIRUS 2 (TAT 6-24 HRS): SARS Coronavirus 2: NEGATIVE

## 2020-10-17 MED ORDER — TECHNETIUM TO 99M ALBUMIN AGGREGATED
4.0000 | Freq: Once | INTRAVENOUS | Status: AC | PRN
  Administered 2020-10-17: 4 via INTRAVENOUS

## 2020-10-18 ENCOUNTER — Other Ambulatory Visit: Payer: Self-pay

## 2020-10-18 ENCOUNTER — Ambulatory Visit (HOSPITAL_COMMUNITY)
Admission: RE | Admit: 2020-10-18 | Discharge: 2020-10-18 | Disposition: A | Discharge status: Home / Self Care | Payer: Medicare HMO | Source: Ambulatory Visit | Attending: Internal Medicine | Admitting: Internal Medicine

## 2020-10-18 DIAGNOSIS — I272 Pulmonary hypertension, unspecified: Secondary | ICD-10-CM | POA: Insufficient documentation

## 2020-10-18 LAB — PULMONARY FUNCTION TEST
DL/VA % pred: 41 %
DL/VA: 1.62 ml/min/mmHg/L
DLCO unc % pred: 33 %
DLCO unc: 8.26 ml/min/mmHg
FEF 25-75 Post: 2.31 L/s
FEF 25-75 Pre: 2.89 L/s
FEF2575-%Change-Post: -19 %
FEF2575-%Pred-Post: 117 %
FEF2575-%Pred-Pre: 146 %
FEV1-%Change-Post: -4 %
FEV1-%Pred-Post: 76 %
FEV1-%Pred-Pre: 80 %
FEV1-Post: 2.2 L
FEV1-Pre: 2.31 L
FEV1FVC-%Change-Post: -8 %
FEV1FVC-%Pred-Pre: 121 %
FEV6-%Change-Post: 1 %
FEV6-%Pred-Post: 71 %
FEV6-%Pred-Pre: 69 %
FEV6-Post: 2.68 L
FEV6-Pre: 2.63 L
FEV6FVC-%Pred-Post: 107 %
FEV6FVC-%Pred-Pre: 107 %
FVC-%Change-Post: 4 %
FVC-%Pred-Post: 68 %
FVC-%Pred-Pre: 66 %
FVC-Post: 2.78 L
FVC-Pre: 2.66 L
Post FEV1/FVC ratio: 79 %
Post FEV6/FVC ratio: 100 %
Pre FEV1/FVC ratio: 87 %
Pre FEV6/FVC Ratio: 100 %

## 2020-10-18 MED ORDER — ALBUTEROL SULFATE (2.5 MG/3ML) 0.083% IN NEBU
2.5000 mg | INHALATION_SOLUTION | Freq: Once | RESPIRATORY_TRACT | Status: AC
  Administered 2020-10-18: 2.5 mg via RESPIRATORY_TRACT

## 2020-10-19 ENCOUNTER — Other Ambulatory Visit: Payer: Self-pay

## 2020-10-19 ENCOUNTER — Encounter: Payer: Self-pay | Admitting: Internal Medicine

## 2020-10-19 ENCOUNTER — Ambulatory Visit (INDEPENDENT_AMBULATORY_CARE_PROVIDER_SITE_OTHER): Payer: Medicare HMO | Admitting: Internal Medicine

## 2020-10-19 DIAGNOSIS — J9611 Chronic respiratory failure with hypoxia: Secondary | ICD-10-CM | POA: Insufficient documentation

## 2020-10-19 DIAGNOSIS — R0902 Hypoxemia: Secondary | ICD-10-CM | POA: Diagnosis not present
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
## 2020-10-19 NOTE — Patient Instructions (Signed)
Make sure you check your oxygen saturation  at your highest level of activity  to be sure it stays over 90% and adjust  02 flow upward to maintain this level if needed but remember to turn it back to previous settings when you stop (to conserve your supply).   We will order your an overnight 02 test and a high resolution CT chest then call you back with the results   Please schedule a follow up visit in 3 months but call sooner if needed in [place]
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
## 2020-10-20 ENCOUNTER — Telehealth: Payer: Self-pay | Admitting: Internal Medicine

## 2020-10-20 DIAGNOSIS — J9611 Chronic respiratory failure with hypoxia: Secondary | ICD-10-CM

## 2020-10-23 ENCOUNTER — Telehealth: Payer: Self-pay | Admitting: Internal Medicine

## 2020-10-23 NOTE — Telephone Encounter (Signed)
Need a new order to send to [facility]

## 2020-10-23 NOTE — Telephone Encounter (Signed)
New order for [employer] was sent

## 2020-10-23 NOTE — Telephone Encounter (Signed)
Called and spoke with patient, advised that he did walk in the office, however, he walked on a regular tank and [employer] needs to make sure he can tolerate the POC.  He also said someone called him to do an overnight test as well.  I advised him to schedule that test because that will tell us if he needs to wear oxygen at night as well.  I let him know that I would call [employer] and find out what is needed as far as a walk.  He verbalized understanding.  Called and spoke with [facility], advised that as of this morning, the order is on hold for the POC because the RT needs to be available to do a best fit POC.  Depending on if he needs pulsed or continuous will determine which poc he will need.  Advised I would call the patient and let her know.  Called patient back and advised of the above per [facility].  He  verbalized understanding.  Nothing further needed.

## 2020-11-14 ENCOUNTER — Other Ambulatory Visit: Payer: Self-pay

## 2020-11-14 ENCOUNTER — Ambulatory Visit (HOSPITAL_COMMUNITY)
Admission: RE | Admit: 2020-11-14 | Discharge: 2020-11-14 | Disposition: A | Discharge status: Home / Self Care | Payer: Medicare HMO | Source: Ambulatory Visit | Attending: Internal Medicine | Admitting: Internal Medicine

## 2020-11-14 DIAGNOSIS — R0902 Hypoxemia: Secondary | ICD-10-CM | POA: Diagnosis present

## 2020-11-14 DIAGNOSIS — I2510 Atherosclerotic heart disease of native coronary artery without angina pectoris: Secondary | ICD-10-CM | POA: Insufficient documentation

## 2020-11-14 DIAGNOSIS — K8020 Calculus of gallbladder without cholecystitis without obstruction: Secondary | ICD-10-CM | POA: Diagnosis not present

## 2020-11-14 DIAGNOSIS — J432 Centrilobular emphysema: Secondary | ICD-10-CM | POA: Diagnosis not present

## 2020-11-14 DIAGNOSIS — I700 Atherosclerosis of aorta: Secondary | ICD-10-CM | POA: Insufficient documentation

## 2020-11-16 ENCOUNTER — Encounter: Payer: Self-pay | Admitting: Internal Medicine

## 2020-11-16 DIAGNOSIS — J84112 Idiopathic pulmonary fibrosis: Secondary | ICD-10-CM | POA: Insufficient documentation

## 2020-11-16 DIAGNOSIS — R9389 Abnormal findings on diagnostic imaging of other specified body structures: Secondary | ICD-10-CM | POA: Insufficient documentation

## 2020-11-16 DIAGNOSIS — J849 Interstitial pulmonary disease, unspecified: Secondary | ICD-10-CM | POA: Insufficient documentation

## 2020-11-16 NOTE — Progress Notes (Signed)
Spoke with pt and notified of results per Dr. Wert. Pt verbalized understanding.

## 2020-11-30 ENCOUNTER — Encounter: Payer: Self-pay | Admitting: Cardiology

## 2020-11-30 ENCOUNTER — Ambulatory Visit (INDEPENDENT_AMBULATORY_CARE_PROVIDER_SITE_OTHER): Payer: Medicare HMO | Admitting: Cardiology

## 2020-11-30 VITALS — BP 134/64 | HR 82 | Ht 70.0 in | Wt 155.0 lb

## 2020-11-30 DIAGNOSIS — I272 Pulmonary hypertension, unspecified: Secondary | ICD-10-CM | POA: Diagnosis not present

## 2020-11-30 DIAGNOSIS — I2510 Atherosclerotic heart disease of native coronary artery without angina pectoris: Secondary | ICD-10-CM | POA: Diagnosis not present

## 2020-11-30 DIAGNOSIS — I10 Essential (primary) hypertension: Secondary | ICD-10-CM | POA: Diagnosis not present

## 2020-11-30 NOTE — Progress Notes (Signed)
Clinical Summary Mr. Ybanez is a 80 y.o.male seen today for follow up of the following medical problems.      1. CAD - history of prior stent  in 9/ 2011 to RCA  He has refused statin. Some renal dysfunction, not on ARB. Has ACE allergy   No recent chest pains   2. HTN - higher doses of norvasc caused some leg swelling and dizziness - bp at clinic appts 120/70s - compliant with meds  3. SOB/Pulmonary HTN - 08/2020 echo LVEF 55-60%, grade I dd, normal RV function, severe pulm HTN PASP 72 - PA Leonides Sake had referred to CHF clinic for pulmonary HTN - 10/2020 VQ no PE - 11/2020 CT chest: early/mild interstitial lung disease,  10/2020 PFTs sevre diffusion defect - he is on 2 to 4 L [place]. Has f/u coming up with Dr Melvyn Novas     4. Possible ILD - ongoing workup by pulmonary - PFTs with severe diffusion defect, recent abnormal high res CT    5. 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 He says he does OK with shots but gets GI upset with PO penicillins Can take small doses with no issues   Tramadol Other (See Comments)     Confusion   Lisinopril Rash and Cough    Patient says it caused bp to increase and caused a rash on his legs    Macrodantin [Nitrofurantoin] Rash     Current Outpatient Medications  Medication Sig Dispense Refill   acetaminophen (TYLENOL) 500 MG tablet Take 250 mg by mouth every 6 (six) hours as needed for moderate pain or headache.     amLODipine (NORVASC) 5 MG tablet Take 5 mg by mouth daily.     aspirin EC 81 MG tablet Take 81 mg by mouth daily.     ibuprofen (ADVIL) 200 MG tablet Take 200 mg by mouth every 6 (six) hours as needed for moderate pain.     nitroGLYCERIN (NITROSTAT) 0.4 MG SL tablet PLACE 1 TAB UNDER TONGUE EVERY 5 MIN IF NEEDED FOR CHEST PAIN. MAY USE 3 TIMES.NORELIEF CALL 911. 25 tablet 3   No current facility-administered medications for this visit.     Past Surgical History:  Procedure Laterality Date   COLONOSCOPY     COLONOSCOPY     CORONARY ANGIOPLASTY  2010   Dr Johnsie Cancel  CYSTOSCOPY WITH LITHOLAPAXY N/A 02/24/2017   Procedure: CYSTOSCOPY WITH LITHOLAPAXY;  Surgeon: Cleon Gustin, MD;  Location: [facility];  Service: Urology;  Laterality: N/A;   CYSTOSCOPY WITH URETHRAL DILATATION  02/24/2017   Procedure: CYSTOSCOPY WITH URETHRAL DILATATION;  Surgeon: Cleon Gustin, MD;  Location: [facility];  Service: Urology;;   DOPPLER ECHOCARDIOGRAPHY     EAR CYST EXCISION Left 07/23/2012   Procedure: EXCISION LEFT EAR LOBE;  Surgeon: Melissa Montane, MD;  Location: [place];  Service: ENT;  Laterality: Left;   HOLMIUM LASER APPLICATION N/A 63/14/9702   Procedure: HOLMIUM LASER APPLICATION OF BLADDER CALCULI;  Surgeon: Cleon Gustin, MD;  Location: [facility];  Service: Urology;  Laterality: N/A;   IR RADIOLOGIST EVAL & MGMT  11/28/2016     Allergies  Allergen Reactions   Ciprofloxacin Other (See Comments)   Crestor [Rosuvastatin] Other (See Comments)    Unknown    Penicillins Other (See Comments)    Per patient causes gi upset with high doses. He says he does OK with  shots but gets GI upset with PO penicillins Can take small doses with no issues   Tramadol Other (See Comments)    Confusion   Lisinopril Rash and Cough    Patient says it caused bp to increase and caused a rash on his legs    Macrodantin [Nitrofurantoin] Rash      No family history on file.   Social History Mr. Gamblin reports that he quit smoking about 10 years ago. His smoking use included cigarettes. He has a 67.50 pack-year smoking history. He has never used smokeless tobacco. Mr. Barsky reports no history of alcohol use.   Review of Systems CONSTITUTIONAL: No weight loss, fever, chills, weakness or fatigue.  HEENT: Eyes: No visual loss, blurred vision, double vision or yellow sclerae.No hearing loss, sneezing, congestion, runny nose or sore throat.  SKIN: No rash or itching.  CARDIOVASCULAR: per hpi RESPIRATORY: No shortness of breath, cough or sputum.  GASTROINTESTINAL: No anorexia, nausea, vomiting or diarrhea. No abdominal pain or blood.  GENITOURINARY: No burning on urination, no polyuria NEUROLOGICAL: No headache, dizziness, syncope, paralysis, ataxia, numbness or tingling in the extremities. No change in bowel or bladder control.  MUSCULOSKELETAL: No muscle, back pain, joint pain or stiffness.  LYMPHATICS: No enlarged nodes. No history of splenectomy.  PSYCHIATRIC: No history of depression or anxiety.  ENDOCRINOLOGIC: No reports of sweating, cold or heat intolerance. No polyuria or polydipsia.  Marland Kitchen   Physical Examination Today's Vitals   11/30/20 1348  BP: 134/64  Pulse: 82  SpO2: 94%  Weight: 155 lb (70.3 kg)  Height: 5\' 10"  (1.778 m)   Body mass index is 22.24 kg/m.  Gen: resting comfortably, no acute distress HEENT: no scleral icterus, pupils equal round and reactive, no palptable cervical adenopathy,  CV: RRR, no m/r/g, no jvd Resp: Clear to auscultation bilaterally GI: abdomen is soft, non-tender, non-distended, normal bowel sounds, no  hepatosplenomegaly MSK: extremities are warm, no edema.  Skin: warm, no rash Neuro:  no focal deficits Psych: appropriate affect   Diagnostic Studies  01/2017 echo Study Conclusions   - Left ventricle: The cavity size was normal. Wall thickness was   increased in a pattern of mild LVH. Systolic function was normal.   The estimated ejection fraction was in the range of 60% to 65%.   Wall motion was normal; there were no regional wall motion   abnormalities. Doppler parameters are consistent with abnormal   left ventricular  relaxation (grade 1 diastolic dysfunction). - Aortic valve: There was mild regurgitation. - Mitral valve: There was mild regurgitation. - Tricuspid valve: There was moderate regurgitation. - Pulmonary arteries: PA peak pressure: 38 mm Hg (S).   Assessment and Plan  1. CAD -denies any symptoms,continue current meds     2. HTN - bp at goal, continue current meds   3. 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 Defer any further workup to CHF clinic. Given likely etiology primary management would be treat hypoxia and lung issues        Arnoldo Lenis, M.D

## 2020-11-30 NOTE — Patient Instructions (Signed)
Medication Instructions:  Continue all current medications.   Labwork: none  Testing/Procedures: none  Follow-Up: 6 months   Any Other Special Instructions Will Be Listed Below (If Applicable).   If you need a refill on your cardiac medications before your next appointment, please call your pharmacy.  

## 2020-12-03 ENCOUNTER — Encounter: Payer: Self-pay | Admitting: Emergency Medicine

## 2020-12-03 ENCOUNTER — Ambulatory Visit
Admission: EM | Admit: 2020-12-03 | Discharge: 2020-12-03 | Disposition: A | Discharge status: Home / Self Care | Payer: Medicare HMO | Attending: Physician Assistant | Admitting: Physician Assistant

## 2020-12-03 ENCOUNTER — Other Ambulatory Visit: Payer: Self-pay

## 2020-12-03 DIAGNOSIS — R197 Diarrhea, unspecified: Secondary | ICD-10-CM

## 2020-12-03 DIAGNOSIS — N390 Urinary tract infection, site not specified: Secondary | ICD-10-CM | POA: Diagnosis not present

## 2020-12-03 DIAGNOSIS — R319 Hematuria, unspecified: Secondary | ICD-10-CM | POA: Diagnosis not present

## 2020-12-03 LAB — POCT URINALYSIS DIP (MANUAL ENTRY)
Glucose, UA: NEGATIVE mg/dL
Nitrite, UA: NEGATIVE
Protein Ur, POC: 100 mg/dL — AB
Spec Grav, UA: >=1.03 — AB (ref 1.010–1.025)
Urobilinogen, UA: 0.2 E.U./dL
pH, UA: 5.5 (ref 5.0–8.0)

## 2020-12-03 MED ORDER — ALIGN 4 MG PO CAPS: ORAL_CAPSULE | ORAL | 0 refills | Status: DC

## 2020-12-03 MED ORDER — CEPHALEXIN 500 MG PO CAPS: 500.0000 mg | ORAL_CAPSULE | Freq: Four times a day (QID) | ORAL | 0 refills | Status: AC

## 2020-12-03 NOTE — ED Triage Notes (Signed)
Diarrhea x 5 days after taking amoxicillin x 1 day  to try to clear up a UTI.,  states he is having lower abd pain and hx of bladder stones.  Burning on urination x 1 week

## 2020-12-03 NOTE — Discharge Instructions (Addendum)
Call Dr. Alyson Ingles to schedule to be seen.  Take tylenol for pain.  Avoid ibuprofen.  Take imodium one dose today

## 2020-12-04 ENCOUNTER — Telehealth: Payer: Self-pay

## 2020-12-04 NOTE — Telephone Encounter (Signed)
Patient would like to reschedule surgery for bladder stones. Please advise.

## 2020-12-04 NOTE — Telephone Encounter (Signed)
Patient went to urgent care with UTI symptoms and diarrhea.  Was given Cephalexin 500 mg  Pt thinking he has Bladder Stones.  Wants an appt with [facility] asap.  Call back  [phone]  Thanks, Helene Kelp

## 2020-12-04 NOTE — ED Provider Notes (Signed)
[facility]    CSN: [id number] Arrival date & time: 12/03/20  1014      History   Chief Complaint No chief complaint on file.   HPI Angel [name] is a 80 y.o. male.   Pt reports pain with urination.  Pt took a dose of amoxicillin.  No relief and he had diarrhea afterwards.    The history is provided by the patient. No language interpreter was used.  Dysuria Presenting symptoms: dysuria   Relieved by:  Nothing Worsened by:  Nothing Associated symptoms: abdominal pain and nausea   Associated symptoms: no fever and no flank pain   Risk factors: bladder surgery    Past Medical History:  Diagnosis Date   Bladder stones    CAD (coronary artery disease)    BMS RCA 8/11; LVEF 50-55%   Cancer (HCC)    Deep vein thrombosis (DVT) ([place])    Gall bladder stones    GERD (gastroesophageal reflux disease)    Gout    History of kidney stones    HLD (hyperlipidemia)    HTN (hypertension)    MI (myocardial infarction) ([place])    MSTEMI 8/11   Pneumonia 2013   Rash, skin    on lower legs   Renal disorder    kidney growth   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    Patient Active Problem List   Diagnosis Date Noted   Abnormal CT of the chest 11/16/2020   Exercise hypoxemia 10/19/2020   Acute cystitis without hematuria 07/07/2019   Calculus of bladder 07/07/2019   BPH (benign prostatic hyperplasia) 02/24/2017   ABDOMINAL PAIN-GENERALIZED 02/16/2010   ESSENTIAL HYPERTENSION, BENIGN 02/02/2010   ELEVATED BLOOD PRESSURE 01/31/2010   MIXED HYPERLIPIDEMIA 01/18/2010   SMOKER 01/18/2010   CORONARY ATHEROSCLEROSIS NATIVE CORONARY ARTERY 01/18/2010   FATIGUE 01/18/2010    Past Surgical History:  Procedure Laterality Date   COLONOSCOPY     COLONOSCOPY     CORONARY ANGIOPLASTY  2010   Dr Johnsie Cancel   CYSTOSCOPY WITH LITHOLAPAXY N/A 02/24/2017   Procedure: CYSTOSCOPY WITH LITHOLAPAXY;  Surgeon: Cleon Gustin, MD;  Location: [facility];  Service: Urology;  Laterality: N/A;   CYSTOSCOPY WITH URETHRAL DILATATION  02/24/2017   Procedure: CYSTOSCOPY WITH URETHRAL DILATATION;  Surgeon: Cleon Gustin, MD;  Location: [facility];  Service: Urology;;   DOPPLER ECHOCARDIOGRAPHY     EAR CYST EXCISION Left 07/23/2012   Procedure: EXCISION LEFT EAR LOBE;  Surgeon: Melissa Montane, MD;  Location: [place];  Service: ENT;  Laterality: Left;   HOLMIUM LASER APPLICATION N/A [id number]   Procedure: HOLMIUM LASER APPLICATION OF BLADDER CALCULI;  Surgeon: Cleon Gustin, MD;  Location: [facility];  Service: Urology;  Laterality: N/A;   IR RADIOLOGIST EVAL & MGMT  11/28/2016       Home Medications    Prior to Admission medications   Medication Sig Start Date End Date Taking? Authorizing Provider  cephALEXin (KEFLEX) 500 MG capsule Take 1 capsule (500 mg total) by mouth 4 (four) times daily for 10 days. 12/03/20 12/13/20 Yes Fransico Meadow, PA-C  Probiotic Product (ALIGN) 4 MG CAPS As directed 12/03/20  Yes Fransico Meadow, PA-C  acetaminophen (TYLENOL) 500 MG tablet Take 250 mg by mouth every 6 (six) hours as needed for moderate pain or headache.    [provider]  amLODipine (NORVASC) 5  MG tablet Take 5 mg by mouth daily.    [provider]  aspirin EC 81 MG tablet Take 81 mg by mouth daily.    [provider]  ibuprofen (ADVIL) 200 MG tablet Take 200 mg by mouth every 6 (six) hours as needed for moderate pain.    [provider]  nitroGLYCERIN (NITROSTAT) 0.4 MG SL tablet PLACE 1 TAB UNDER TONGUE EVERY 5 MIN IF NEEDED FOR CHEST PAIN. MAY USE 3 TIMES.NORELIEF CALL 911. 07/27/20   Arnoldo Lenis, MD    Family History History reviewed. No pertinent family history.  Social History Social History   Tobacco Use   Smoking status: Former    Packs/day: 1.50    Years: 45.00    Pack years: 67.50    Types: Cigarettes    Quit date: 12/11/2009    Years since quitting: 10.9    Smokeless tobacco: Never  Vaping Use   Vaping Use: Never used  Substance Use Topics   Alcohol use: No   Drug use: No     Allergies   Ciprofloxacin, Crestor [rosuvastatin], Penicillins, Tramadol, Lisinopril, and Macrodantin [nitrofurantoin]   Review of Systems Review of Systems  Constitutional:  Negative for fever.  Gastrointestinal:  Positive for abdominal pain and nausea.  Genitourinary:  Positive for dysuria. Negative for flank pain.  All other systems reviewed and are negative.   Physical Exam Triage Vital Signs ED Triage Vitals  Enc Vitals Group     BP 12/03/20 1049 138/66     Pulse Rate 12/03/20 1049 61     Resp 12/03/20 1049 16     Temp 12/03/20 1049 97.6 F (36.4 C)     Temp Source 12/03/20 1049 Temporal     SpO2 12/03/20 1049 90 %     Weight --      Height --      Head Circumference --      Peak Flow --      Pain Score 12/03/20 1053 7     Pain Loc --      Pain Edu? --      Excl. in [place]? --    No data found.  Updated Vital Signs BP 138/66 (BP Location: Right Arm)   Pulse 61   Temp 97.6 F (36.4 C) (Temporal)   Resp 16   SpO2 90%   Visual Acuity Right Eye Distance:   Left Eye Distance:   Bilateral Distance:    Right Eye Near:   Left Eye Near:    Bilateral Near:     Physical Exam Vitals and nursing note reviewed.  Constitutional:      Appearance: He is well-developed.  HENT:     Head: Normocephalic and atraumatic.  Eyes:     Conjunctiva/sclera: Conjunctivae normal.  Cardiovascular:     Rate and Rhythm: Normal rate and regular rhythm.     Heart sounds: No murmur heard. Pulmonary:     Effort: Pulmonary effort is normal. No respiratory distress.     Breath sounds: Normal breath sounds.  Abdominal:     Palpations: Abdomen is soft.     Tenderness: There is no abdominal tenderness.  Musculoskeletal:     Cervical back: Neck supple.  Skin:    General: Skin is warm and dry.  Neurological:     General: No focal deficit present.     Mental  Status: He is alert. He is disoriented.     UC Treatments / Results  Labs (all labs ordered are  listed, but only abnormal results are displayed) Labs Reviewed  POCT URINALYSIS DIP (MANUAL ENTRY) - Abnormal; Notable for the following components:      Result Value   Color, UA straw (*)    Clarity, UA cloudy (*)    Bilirubin, UA small (*)    Ketones, POC UA trace (5) (*)    Spec Grav, UA >=1.030 (*)    Blood, UA moderate (*)    Protein Ur, POC =100 (*)    Leukocytes, UA Large (3+) (*)    All other components within normal limits    EKG   Radiology No results found.  Procedures Procedures (including critical care time)  Medications Ordered in UC Medications - No data to display  Initial Impression / Assessment and Plan / UC Course  I have reviewed the triage vital signs and the nursing notes.  Pertinent labs & imaging results that were available during my care of the patient were reviewed by me and considered in my medical decision making (see chart for details).     MDM:  Ua shows leukocytes.  Rx for keflex and probiotic Final Clinical Impressions(s) / UC Diagnoses   Final diagnoses:  Urinary tract infection with hematuria, site unspecified  Diarrhea, unspecified type     Discharge Instructions      Call Dr. Alyson Ingles to schedule to be seen.  Take tylenol for pain.  Avoid ibuprofen.  Take imodium one dose today    ED Prescriptions     Medication Sig Dispense Auth. Provider   cephALEXin (KEFLEX) 500 MG capsule Take 1 capsule (500 mg total) by mouth 4 (four) times daily for 10 days. 40 capsule Fransico Meadow, [place]   Probiotic Product (ALIGN) 4 MG CAPS As directed 30 capsule Fransico Meadow, [place]      PDMP not reviewed this encounter.   Fransico Meadow, [place] 12/04/20 1300

## 2020-12-06 ENCOUNTER — Telehealth: Payer: Self-pay | Admitting: Internal Medicine

## 2020-12-06 DIAGNOSIS — J9611 Chronic respiratory failure with hypoxia: Secondary | ICD-10-CM

## 2020-12-06 NOTE — Telephone Encounter (Signed)
ONO on RA 11/22/20 done by [facility]- Per MW, pos for desat- needs to start o2 2lpm with sleep and repeat ONO on 2lpm .   Tried calling the pt and there was no answer- LMTCB.

## 2020-12-15 NOTE — Telephone Encounter (Signed)
Spoke with pt and notified of results per Dr. Melvyn Novas. Pt verbalized understanding and denied any questions. Orders sent to [facility] to add that he needs o2 with sleep as well as ONO on 2lpm.

## 2020-12-18 DIAGNOSIS — N1831 Chronic kidney disease, stage 3a: Secondary | ICD-10-CM | POA: Insufficient documentation

## 2020-12-18 DIAGNOSIS — Z9981 Dependence on supplemental oxygen: Secondary | ICD-10-CM | POA: Insufficient documentation

## 2020-12-29 ENCOUNTER — Encounter: Payer: Self-pay | Admitting: Internal Medicine

## 2021-01-03 ENCOUNTER — Telehealth: Payer: Self-pay | Admitting: Internal Medicine

## 2021-01-03 DIAGNOSIS — J9611 Chronic respiratory failure with hypoxia: Secondary | ICD-10-CM

## 2021-01-03 NOTE — Telephone Encounter (Signed)
ONO on 2lpm done by [facility] on 12/29/20- pos for desat on 2lpm, needs to increase o2 to 3lpm now and have ONO repeated on the 3lpm. Called pt to discuss and had to [facility].

## 2021-01-05 ENCOUNTER — Ambulatory Visit (INDEPENDENT_AMBULATORY_CARE_PROVIDER_SITE_OTHER): Payer: Medicare HMO | Admitting: Urology

## 2021-01-05 ENCOUNTER — Other Ambulatory Visit: Payer: Self-pay

## 2021-01-05 ENCOUNTER — Encounter: Payer: Self-pay | Admitting: Urology

## 2021-01-05 DIAGNOSIS — N390 Urinary tract infection, site not specified: Secondary | ICD-10-CM | POA: Diagnosis not present

## 2021-01-05 DIAGNOSIS — N210 Calculus in bladder: Secondary | ICD-10-CM

## 2021-01-05 MED ORDER — AMOXICILLIN 250 MG PO CAPS: 250.0000 mg | ORAL_CAPSULE | Freq: Every day | ORAL | 11 refills | Status: DC

## 2021-01-05 NOTE — Progress Notes (Signed)
01/05/2021 11:34 AM   Angel [name] Feb 25, 1941 [id number]  Referring provider: Bernerd Limbo, MD [address] [place],   [id number]  Followup UTI and bladder calculus   HPI: Angel [name] is a 80yo here for bladder calculi and recurrent UTI. He was diagnosed with a UTI in 11/2020. He is currently on home O2 and uses 2-3L. He passed 4 bladder calculi since last visit. He has gross hematuria when passing the calculi. He denies any dysuria. He moderate LUTS on no BPH therapy. No other complaints. He was previously on '250mg'$  amoxicillin daily which worked well to prevent hsi UTIs.    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    Surgical History: Past Surgical History:  Procedure Laterality Date   COLONOSCOPY     COLONOSCOPY     CORONARY ANGIOPLASTY  2010   Dr Johnsie Cancel   CYSTOSCOPY WITH LITHOLAPAXY N/A 02/24/2017   Procedure: CYSTOSCOPY WITH LITHOLAPAXY;  Surgeon: Cleon Gustin, MD;  Location: [facility];  Service: Urology;  Laterality: N/A;   CYSTOSCOPY WITH URETHRAL DILATATION  02/24/2017   Procedure: CYSTOSCOPY WITH URETHRAL DILATATION;  Surgeon: Cleon Gustin, MD;  Location: [facility];  Service: Urology;;   DOPPLER ECHOCARDIOGRAPHY     EAR CYST EXCISION Left 07/23/2012   Procedure: EXCISION LEFT EAR LOBE;  Surgeon: Melissa Montane, MD;  Location: [facility];  Service: ENT;  Laterality: Left;   HOLMIUM LASER APPLICATION N/A [id number]    Procedure: HOLMIUM LASER APPLICATION OF BLADDER CALCULI;  Surgeon: Cleon Gustin, MD;  Location: [facility];  Service: Urology;  Laterality: N/A;   IR RADIOLOGIST EVAL & MGMT  11/28/2016    Home Medications:  Allergies as of 01/05/2021       Reactions   Ciprofloxacin Other (See Comments)   Crestor [rosuvastatin] Other (See Comments)   Unknown    Penicillins Other (See Comments)   Per patient causes gi upset with high doses. He says he does OK with shots but gets GI upset with PO penicillins Can take small doses with no issues   Tramadol Other (See Comments)   Confusion   Lisinopril Rash, Cough   Patient says it caused bp to increase and caused a rash on his legs    Macrodantin [nitrofurantoin] Rash        Medication List        Accurate as of January 05, 2021 11:34 AM. If you have any questions, ask your nurse or doctor.          acetaminophen 500 MG tablet Commonly known as: TYLENOL Take 250 mg by mouth every 6 (six) hours as needed for moderate pain or headache.   Align 4 MG Caps As directed   amLODipine 5 MG tablet  Commonly known as: NORVASC Take 5 mg by mouth daily.   aspirin EC 81 MG tablet Take 81 mg by mouth daily.   cephALEXin 250 MG capsule Commonly known as: KEFLEX Take 250 mg by mouth 3 (three) times daily.   ibuprofen 200 MG tablet Commonly known as: ADVIL Take 200 mg by mouth every 6 (six) hours as needed for moderate pain.   nitroGLYCERIN 0.4 MG SL tablet Commonly known as: NITROSTAT PLACE 1 TAB UNDER TONGUE EVERY 5 MIN IF NEEDED FOR CHEST PAIN. MAY USE 3 TIMES.[place] 911.   phenazopyridine 200 MG tablet Commonly known as: PYRIDIUM Take 200 mg by mouth 3 (three) times daily as needed.        Allergies:  Allergies  Allergen Reactions   Ciprofloxacin Other (See Comments)   Crestor [Rosuvastatin] Other (See Comments)    Unknown    Penicillins Other (See Comments)    Per patient causes gi upset with high doses. He says he does  OK with shots but gets GI upset with PO penicillins Can take small doses with no issues   Tramadol Other (See Comments)    Confusion   Lisinopril Rash and Cough    Patient says it caused bp to increase and caused a rash on his legs    Macrodantin [Nitrofurantoin] Rash    Family History: No family history on file.  Social History:  reports that he quit smoking about 11 years ago. His smoking use included cigarettes. He has a 67.50 pack-year smoking history. He has never used smokeless tobacco. He reports that he does not drink alcohol and does not use drugs.  ROS: All other review of systems were reviewed and are negative except what is noted above in HPI  Physical Exam: BP (!) 151/61   Pulse 76   Constitutional:  Alert and oriented, No acute distress. HEENT: [place] AT, moist mucus membranes.  Trachea midline, no masses. Cardiovascular: No clubbing, cyanosis, or edema. Respiratory: Normal respiratory effort, no increased work of breathing. GI: Abdomen is soft, nontender, nondistended, no abdominal masses GU: No CVA tenderness.  Lymph: No cervical or inguinal lymphadenopathy. Skin: No rashes, bruises or suspicious lesions. Neurologic: Grossly intact, no focal deficits, moving all 4 extremities. Psychiatric: Normal mood and affect.  Laboratory Data: Lab Results  Component Value Date   WBC 7.4 09/25/2020   HGB 17.9 (H) 09/25/2020   HCT 53.0 (H) 09/25/2020   MCV 90.3 09/25/2020   PLT 163 09/25/2020    Lab Results  Component Value Date   CREATININE 1.65 (H) 09/25/2020    No results found for: PSA  No results found for: TESTOSTERONE  No results found for: HGBA1C  Urinalysis    Component Value Date/Time   COLORURINE YELLOW 04/04/2020 1925   APPEARANCEUR Cloudy (A) 08/02/2020 1138   LABSPEC 1.016 04/04/2020 1925   PHURINE 6.0 04/04/2020 1925   GLUCOSEU Negative 08/02/2020 1138   HGBUR MODERATE (A) 04/04/2020 1925   BILIRUBINUR small (A) 12/03/2020 1104   BILIRUBINUR  Negative 08/02/2020 1138   KETONESUR trace (5) (A) 12/03/2020 1104   KETONESUR NEGATIVE 04/04/2020 1925   PROTEINUR =100 (A) 12/03/2020 1104   PROTEINUR 1+ (A) 08/02/2020 1138   PROTEINUR 100 (A) 04/04/2020 1925   UROBILINOGEN 0.2 12/03/2020 1104   UROBILINOGEN 0.2 07/25/2012 0446   NITRITE Negative 12/03/2020 1104   NITRITE Negative 08/02/2020 1138   NITRITE NEGATIVE 04/04/2020 1925   LEUKOCYTESUR Large (3+) (A) 12/03/2020 1104   LEUKOCYTESUR 3+ (A) 08/02/2020 1138  LEUKOCYTESUR LARGE (A) 04/04/2020 1925    Lab Results  Component Value Date   LABMICR See below: 08/02/2020   WBCUA >30 (A) 08/02/2020   LABEPIT 0-10 08/02/2020   BACTERIA Moderate (A) 08/02/2020    Pertinent Imaging:  No results found for this or any previous visit.  No results found for this or any previous visit.  No results found for this or any previous visit.  No results found for this or any previous visit.  Results for orders placed during the hospital encounter of 09/22/18  US RENAL  Narrative CLINICAL DATA:  80 year old male with follow-up of right renal mass. Cystoscopy on October 2018.  EXAM: RENAL / URINARY TRACT ULTRASOUND COMPLETE  COMPARISON:  Renal ultrasound dated 02/03/2018 and CT dated 07/23/2017  FINDINGS: Right Kidney:  Renal measurements: 2.5 x 5.6 x 5.7 cm = volume: 209 mL. The right kidney demonstrates a normal echogenicity. No hydronephrosis or shadowing stone. There is a 4.8 x 3.7 x 4.9 cm (previously 4.3 x 3.9 x 4.4 cm) solid mass with increased echogenicity compared to the renal parenchyma arising from the inferior pole of the right kidney. Doppler images demonstrate some flow within this mass.  Left Kidney:  Renal measurements: 10.6 x 5.6 x 5.6 cm = volume: 173 mL. Normal echogenicity. There is a 6 mm nonobstructing inferior pole calculus. No hydronephrosis.  Bladder:  The urinary bladder is only partially distended and grossly unremarkable for the degree  of distention. Ureteral jets are not visualized.  The prostate gland is enlarged measuring 5.7 x 5.2 x 6.0 cm for a volume of 95 cc.  IMPRESSION: 1. Similar-sized or minimally increased size of the right renal inferior pole mass compared to the prior ultrasound. 2. A 6 mm nonobstructing left renal inferior pole calculus. No hydronephrosis or obstructing stone. 3. Enlarged prostate gland.   Electronically Signed By: Anner Crete M.D. On: 09/22/2018 20:38  No results found for this or any previous visit.  No results found for this or any previous visit.  No results found for this or any previous visit.   Assessment & Plan:    1. Urinary tract infection without hematuria, site unspecified -We will restart amoxicillin '250mg'$  daily  2. Calculus of bladder -Bladder US, will call with results - Urinalysis, Routine w reflex microscopic   No follow-ups on file.  Nicolette Bang, MD  [facility] [place]

## 2021-01-05 NOTE — Progress Notes (Signed)
Urological Symptom Review  Patient is experiencing the following symptoms: Frequent urination Hard to postpone urination Get up at night to urinate   Review of Systems  Gastrointestinal (upper)  : Negative for upper GI symptoms  Gastrointestinal (lower) : Negative for lower GI symptoms  Constitutional : Negative for symptoms  Skin: Negative for skin symptoms  Eyes: Negative for eye symptoms  Ear/Nose/Throat : Negative for Ear/Nose/Throat symptoms  Hematologic/Lymphatic: Negative for Hematologic/Lymphatic symptoms  Cardiovascular : Negative for cardiovascular symptoms  Respiratory : Shortness of breath  Endocrine: Negative for endocrine symptoms  Musculoskeletal: Negative for musculoskeletal symptoms  Neurological: Negative for neurological symptoms  Psychologic: Negative for psychiatric symptoms

## 2021-01-05 NOTE — Telephone Encounter (Signed)
Spoke with pt and notified of results per Dr. Melvyn Novas. Pt verbalized understanding and denied any questions. Pt okay to proceed with ONO 3lpm and new order for o2 updated

## 2021-01-05 NOTE — Patient Instructions (Signed)
Textbook of Natural Medicine (5th ed., pp. 1518-1527.e3). [place], [place]: Elsevier.">  Dietary Guidelines to Help Prevent Kidney Stones Kidney stones are deposits of minerals and salts that form inside your kidneys. Your risk of developing kidney stones may be greater depending on your diet, your lifestyle, the medicines you take, and whether you have certain medical conditions. Most people can lower their chances of developing kidney stones by following the instructions below. Your dietitian may give you more specific instructions depending on your overall health and the type of kidney stones youtend to develop. What are tips for following this plan? Reading food labels  Choose foods with "no salt added" or "low-salt" labels. Limit your salt (sodium) intake to less than 1,500 mg a day. Choose foods with calcium for each meal and snack. Try to eat about 300 mg of calcium at each meal. Foods that contain 200-500 mg of calcium a serving include: 8 oz (237 mL) of milk, calcium-fortifiednon-dairy milk, and calcium-fortifiedfruit juice. Calcium-fortified means that calcium has been added to these drinks. 8 oz (237 mL) of kefir, yogurt, and soy yogurt. 4 oz (114 g) of tofu. 1 oz (28 g) of cheese. 1 cup (150 g) of dried figs. 1 cup (91 g) of cooked broccoli. One 3 oz (85 g) can of sardines or mackerel. Most people need 1,000-1,500 mg of calcium a day. Talk to your dietitian abouthow much calcium is recommended for you. Shopping Buy plenty of fresh fruits and vegetables. Most people do not need to avoid fruits and vegetables, even if these foods contain nutrients that may contribute to kidney stones. When shopping for convenience foods, choose: Whole pieces of fruit. Pre-made salads with dressing on the side. Low-fat fruit and yogurt smoothies. Avoid buying frozen meals or prepared deli foods. These can be high in sodium. Look for foods with live cultures, such as yogurt and kefir. Choose high-fiber  grains, such as whole-wheat breads, oat bran, and wheat cereals. Cooking Do not add salt to food when cooking. Place a salt shaker on the table and allow each person to add his or her own salt to taste. Use vegetable protein, such as beans, textured vegetable protein (TVP), or tofu, instead of meat in pasta, casseroles, and soups. Meal planning Eat less salt, if told by your dietitian. To do this: Avoid eating processed or pre-made food. Avoid eating fast food. Eat less animal protein, including cheese, meat, poultry, or fish, if told by your dietitian. To do this: Limit the number of times you have meat, poultry, fish, or cheese each week. Eat a diet free of meat at least 2 days a week. Eat only one serving each day of meat, poultry, fish, or seafood. When you prepare animal protein, cut pieces into small portion sizes. For most meat and fish, one serving is about the size of the palm of your hand. Eat at least five servings of fresh fruits and vegetables each day. To do this: Keep fruits and vegetables on hand for snacks. Eat one piece of fruit or a handful of berries with breakfast. Have a salad and fruit at lunch. Have two kinds of vegetables at dinner. Limit foods that are high in a substance called oxalate. These include: Spinach (cooked), rhubarb, beets, sweet potatoes, and Swiss chard. Peanuts. Potato chips, french fries, and baked potatoes with skin on. Nuts and nut products. Chocolate. If you regularly take a diuretic medicine, make sure to eat at least 1 or 2 servings of fruits or vegetables that are   high in potassium each day. These include: Avocado. Banana. Orange, prune, carrot, or tomato juice. Baked potato. Cabbage. Beans and split peas. Lifestyle  Drink enough fluid to keep your urine pale yellow. This is the most important thing you can do. Spread your fluid intake throughout the day. If you drink alcohol: Limit how much you use to: 0-1 drink a day for women who  are not pregnant. 0-2 drinks a day for men. Be aware of how much alcohol is in your drink. In the [place] one drink equals one 12 oz bottle of beer (355 mL), one 5 oz glass of wine (148 mL), or one 1 oz glass of hard liquor (44 mL). Lose weight if told by your health care provider. Work with your dietitian to find an eating plan and weight loss strategies that work best for you.  General information Talk to your health care provider and dietitian about taking daily supplements. You may be told the following depending on your health and the cause of your kidney stones: Not to take supplements with vitamin C. To take a calcium supplement. To take a daily probiotic supplement. To take other supplements such as magnesium, fish oil, or vitamin B6. Take over-the-counter and prescription medicines only as told by your health care provider. These include supplements. What foods should I limit? Limit your intake of the following foods, or eat them as told by your dietitian. Vegetables Spinach. Rhubarb. Beets. Canned vegetables. Pickles. Olives. Baked potatoeswith skin. Grains Wheat bran. Baked goods. Salted crackers. Cereals high in sugar. Meats and other proteins Nuts. Nut butters. Large portions of meat, poultry, or fish. Salted, precooked,or cured meats, such as sausages, meat loaves, and hot dogs. Dairy Cheese. Beverages Regular soft drinks. Regular vegetable juice. Seasonings and condiments Seasoning blends with salt. Salad dressings. Soy sauce. Ketchup. Barbecue sauce. Other foods Canned soups. Canned pasta sauce. Casseroles. Pizza. Lasagna. Frozen meals.Potato chips. French fries. The items listed above may not be a complete list of foods and beverages you should limit. Contact a dietitian for more information. What foods should I avoid? Talk to your dietitian about specific foods you should avoid based on the typeof kidney stones you have and your overall health. Fruits Grapefruit. The  item listed above may not be a complete list of foods and beverages you should avoid. Contact a dietitian for more information. Summary Kidney stones are deposits of minerals and salts that form inside your kidneys. You can lower your risk of kidney stones by making changes to your diet. The most important thing you can do is drink enough fluid. Drink enough fluid to keep your urine pale yellow. Talk to your dietitian about how much calcium you should have each day, and eat less salt and animal protein as told by your dietitian. This information is not intended to replace advice given to you by your health care provider. Make sure you discuss any questions you have with your healthcare provider. Document Revised: 04/22/2019 Document Reviewed: 04/22/2019 Elsevier Patient Education  2022 Elsevier Inc.  

## 2021-01-11 ENCOUNTER — Encounter: Payer: Self-pay | Admitting: *Deleted

## 2021-01-11 ENCOUNTER — Ambulatory Visit (INDEPENDENT_AMBULATORY_CARE_PROVIDER_SITE_OTHER): Payer: Medicare HMO | Admitting: Internal Medicine

## 2021-01-11 ENCOUNTER — Other Ambulatory Visit: Payer: Self-pay

## 2021-01-11 ENCOUNTER — Encounter: Payer: Self-pay | Admitting: Internal Medicine

## 2021-01-11 DIAGNOSIS — R0902 Hypoxemia: Secondary | ICD-10-CM | POA: Diagnosis not present
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
## 2021-01-11 NOTE — Patient Instructions (Addendum)
Make sure you check your oxygen saturation  at your highest level of activity  to be sure it stays over 90% and adjust  02 flow upward to maintain this level if needed but remember to turn it back to previous settings when you stop (to conserve your supply).   We will repeat your 02 on 3lpm at bedime   Please schedule a follow up visit in 6 months but call sooner if needed     Add: needs cbc/bmet

## 2021-01-11 NOTE — Addendum Note (Signed)
Addended by: [name] B on: 01/11/2021 02:06 PM   Modules accepted: Orders
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
## 2021-01-18 ENCOUNTER — Ambulatory Visit (HOSPITAL_COMMUNITY)
Admission: RE | Admit: 2021-01-18 | Discharge: 2021-01-18 | Disposition: A | Discharge status: Home / Self Care | Payer: Medicare HMO | Source: Ambulatory Visit | Attending: Urology | Admitting: Urology

## 2021-01-18 ENCOUNTER — Other Ambulatory Visit: Payer: Self-pay

## 2021-01-18 ENCOUNTER — Other Ambulatory Visit (HOSPITAL_COMMUNITY): Payer: Medicare HMO

## 2021-01-18 DIAGNOSIS — N2889 Other specified disorders of kidney and ureter: Secondary | ICD-10-CM | POA: Diagnosis present

## 2021-01-18 DIAGNOSIS — N210 Calculus in bladder: Secondary | ICD-10-CM | POA: Diagnosis present

## 2021-01-30 ENCOUNTER — Ambulatory Visit (HOSPITAL_COMMUNITY): Payer: Medicaid Other

## 2021-02-05 ENCOUNTER — Ambulatory Visit: Payer: Medicare HMO | Admitting: Urology

## 2021-02-14 ENCOUNTER — Telehealth: Payer: Self-pay | Admitting: Internal Medicine

## 2021-02-14 NOTE — Telephone Encounter (Signed)
Pt notified of response and nothing further needed

## 2021-02-14 NOTE — Telephone Encounter (Signed)
Fine can discuss at next ov, just keep wearing 02 as is and look for any daytime drowsiness or AM ha which would indicate need for sleep eval / referral to sleep medicine

## 2021-02-14 NOTE — Telephone Encounter (Signed)
ONO done on 3lpm reviewed by MW- done by [facility] on 01/18/21 Per MW after d/w Dr Ander Slade- pt needs split night sleep study and appt with sleep md in [place]  I called and spoke with the pt and he does not want to do either of these things, states he sleeps well  He says he never gasps for air or feels SOB when he lies down or early am

## 2021-05-16 ENCOUNTER — Ambulatory Visit
Admission: EM | Admit: 2021-05-16 | Discharge: 2021-05-16 | Disposition: A | Discharge status: Home / Self Care | Payer: Medicare HMO | Attending: Family Medicine | Admitting: Family Medicine

## 2021-05-16 ENCOUNTER — Other Ambulatory Visit: Payer: Self-pay

## 2021-05-16 DIAGNOSIS — J069 Acute upper respiratory infection, unspecified: Secondary | ICD-10-CM

## 2021-05-16 MED ORDER — FLUTICASONE PROPIONATE 50 MCG/ACT NA SUSP
1.0000 | Freq: Two times a day (BID) | NASAL | 2 refills | Status: DC
Start: 2021-05-16 — End: 2022-07-02

## 2021-05-16 MED ORDER — ALBUTEROL SULFATE HFA 108 (90 BASE) MCG/ACT IN AERS: 2.0000 | INHALATION_SPRAY | Freq: Four times a day (QID) | RESPIRATORY_TRACT | 0 refills | Status: DC | PRN

## 2021-05-16 MED ORDER — PROMETHAZINE-DM 6.25-15 MG/5ML PO SYRP: 5.0000 mL | ORAL_SOLUTION | Freq: Four times a day (QID) | ORAL | 0 refills | Status: DC | PRN

## 2021-05-16 NOTE — ED Provider Notes (Signed)
[facility]    CSN: [id number] Arrival date & time: 05/16/21  1401      History   Chief Complaint Chief Complaint  Patient presents with   Sore Throat    Cough and sore throat    HPI Angel [name] is a 81 y.o. male.   Patient presenting today with 3-day history of sore throat, nasal congestion, cough.  He states he had a low-grade fever the first day or so but that has gone away.  He denies chest pain, shortness of breath beyond baseline, abdominal pain, nausea vomiting diarrhea, body aches.  No new sick contacts recently.  Has not been taking anything over-the-counter for symptoms.  He states his home oxygen has been around 89% on room air which is near baseline for him, he has an oxygen tank that he is able to wear as needed from his pulmonologist for dyspnea on exertion but he has not been wearing it up slightly as he feels well.  Is a long-term former cigarette smoker but does not have any diagnosed COPD based on pulmonology records.   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    Patient Active Problem List   Diagnosis Date Noted   Abnormal CT of the chest 11/16/2020   Exercise hypoxemia 10/19/2020   Acute cystitis without hematuria 07/07/2019   Calculus of bladder 07/07/2019   BPH (benign prostatic hyperplasia) 02/24/2017   ABDOMINAL PAIN-GENERALIZED 02/16/2010   ESSENTIAL HYPERTENSION, BENIGN 02/02/2010   ELEVATED BLOOD PRESSURE 01/31/2010    MIXED HYPERLIPIDEMIA 01/18/2010   SMOKER 01/18/2010   CORONARY ATHEROSCLEROSIS NATIVE CORONARY ARTERY 01/18/2010   FATIGUE 01/18/2010    Past Surgical History:  Procedure Laterality Date   COLONOSCOPY     COLONOSCOPY     CORONARY ANGIOPLASTY  2010   Dr Johnsie Cancel   CYSTOSCOPY WITH LITHOLAPAXY N/A 02/24/2017   Procedure: CYSTOSCOPY WITH LITHOLAPAXY;  Surgeon: Cleon Gustin, MD;  Location: [facility];  Service: Urology;  Laterality: N/A;   CYSTOSCOPY WITH URETHRAL DILATATION  02/24/2017   Procedure: CYSTOSCOPY WITH URETHRAL DILATATION;  Surgeon: Cleon Gustin, MD;  Location: [facility];  Service: Urology;;   DOPPLER ECHOCARDIOGRAPHY     EAR CYST EXCISION Left 07/23/2012   Procedure: EXCISION LEFT EAR LOBE;  Surgeon: Melissa Montane, MD;  Location: [facility];  Service: ENT;  Laterality: Left;   HOLMIUM LASER APPLICATION N/A 89/21/1941   Procedure: HOLMIUM LASER APPLICATION OF BLADDER CALCULI;  Surgeon: Cleon Gustin, MD;  Location: [facility];  Service: Urology;  Laterality: N/A;   IR RADIOLOGIST EVAL & MGMT  11/28/2016       Home Medications    Prior to Admission medications   Medication Sig Start Date End Date Taking? Authorizing Provider  albuterol (VENTOLIN HFA)  108 (90 Base) MCG/ACT inhaler Inhale 2 puffs into the lungs every 6 (six) hours as needed for wheezing or shortness of breath. 05/16/21  Yes Volney American, PA-C  fluticasone [facility]) 50 MCG/ACT nasal spray Place 1 spray into both nostrils 2 (two) times daily. 05/16/21  Yes Volney American, PA-C  promethazine-dextromethorphan (PROMETHAZINE-DM) 6.25-15 MG/5ML syrup Take 5 mLs by mouth 4 (four) times daily as needed. 05/16/21  Yes Volney American, PA-C  acetaminophen (TYLENOL) 500 MG tablet Take 250 mg by mouth every 6 (six) hours as needed for moderate pain or headache.    [provider]  amLODipine (NORVASC) 5 MG tablet Take 5 mg by mouth daily.    [provider]  amoxicillin (AMOXIL) 250 MG  capsule Take 1 capsule (250 mg total) by mouth daily. 01/05/21   McKenzie, Candee Furbish, MD  aspirin EC 81 MG tablet Take 81 mg by mouth daily.    [provider]  ibuprofen (ADVIL) 200 MG tablet Take 200 mg by mouth every 6 (six) hours as needed for moderate pain.    [provider]  nitroGLYCERIN (NITROSTAT) 0.4 MG SL tablet PLACE 1 TAB UNDER TONGUE EVERY 5 MIN IF NEEDED FOR CHEST PAIN. MAY USE 3 TIMES.NORELIEF CALL 911. 07/27/20   Arnoldo Lenis, MD  Probiotic Product (ALIGN) 4 MG CAPS As directed 12/03/20   Sidney Ace    Family History History reviewed. No pertinent family history.  Social History Social History   Tobacco Use   Smoking status: Former    Packs/day: 1.50    Years: 45.00    Pack years: 67.50    Types: Cigarettes    Quit date: 12/11/2009    Years since quitting: 11.4   Smokeless tobacco: Never  Vaping Use   Vaping Use: Never used  Substance Use Topics   Alcohol use: No   Drug use: No     Allergies   Ciprofloxacin, Crestor [rosuvastatin], Penicillins, Tramadol, Lisinopril, and Macrodantin [nitrofurantoin]   Review of Systems Review of Systems Per HPI  Physical Exam Triage Vital Signs ED Triage Vitals  Enc Vitals Group     BP 05/16/21 1418 100/60     Pulse Rate 05/16/21 1418 95     Resp 05/16/21 1418 16     Temp 05/16/21 1418 (!) 97.1 F (36.2 C)     Temp Source 05/16/21 1418 Oral     SpO2 05/16/21 1418 (!) 89 %     Weight --      Height --      Head Circumference --      Peak Flow --      Pain Score 05/16/21 1415 7     Pain Loc --      Pain Edu? --      Excl. in [place]? --    No data found.  Updated Vital Signs BP 100/60 (BP Location: Right Arm)    Pulse 95    Temp (!) 97.1 F (36.2 C) (Oral)    Resp 16    SpO2 90%   Visual Acuity Right Eye Distance:   Left Eye Distance:   Bilateral Distance:    Right Eye Near:   Left Eye Near:    Bilateral Near:     Physical Exam Vitals and nursing note reviewed.   Constitutional:      Appearance: He is well-developed.  HENT:     Head: Atraumatic.     Right Ear: External ear normal.  Left Ear: External ear normal.     Nose: Rhinorrhea present.     Mouth/Throat:     Mouth: Mucous membranes are moist.     Pharynx: Posterior oropharyngeal erythema present. No oropharyngeal exudate.  Eyes:     Conjunctiva/sclera: Conjunctivae normal.     Pupils: Pupils are equal, round, and reactive to light.  Cardiovascular:     Rate and Rhythm: Normal rate and regular rhythm.  Pulmonary:     Effort: Pulmonary effort is normal. No respiratory distress.     Breath sounds: No wheezing or rales.     Comments: Decreased breath sounds throughout.  No wheezes, rales on auscultation Musculoskeletal:        General: Normal range of motion.     Cervical back: Normal range of motion and neck supple.  Lymphadenopathy:     Cervical: No cervical adenopathy.  Skin:    General: Skin is warm and dry.  Neurological:     Mental Status: He is alert and oriented to person, place, and time.  Psychiatric:        Behavior: Behavior normal.     UC Treatments / Results  Labs (all labs ordered are listed, but only abnormal results are displayed) Labs Reviewed  COVID-19, FLU A+B NAA    EKG   Radiology No results found.  Procedures Procedures (including critical care time)  Medications Ordered in UC Medications - No data to display  Initial Impression / Assessment and Plan / UC Course  I have reviewed the triage vital signs and the nursing notes.  Pertinent labs & imaging results that were available during my care of the patient were reviewed by me and considered in my medical decision making (see chart for details).     Oxygen saturation on room air today ranging between 89% to 90% which is near his baseline per record review.  He states he has a home oxygen machine and is able to wear it, discussed wearing nearly continuously to help support his oxygen  levels.  He is in no acute distress, speaking in full sentences and breathing comfortably on room air on exam.  COVID and flu testing pending given new upper respiratory symptoms.  Symptomatic management with Phenergan DM, Flonase, Coricidin HBP, supportive home care.  Strict return precautions given for acutely worsening symptoms at any time.  Final Clinical Impressions(s) / UC Diagnoses   Final diagnoses:  Viral URI with cough     Discharge Instructions      Use your oxygen continuously throughout the day to keep your oxygen levels up.  Continue to monitor your home oxygen levels and go to the emergency department if your symptoms worsen at any time.  We will be in touch with your COVID and flu testing results.  I have sent over some medications to help with your symptoms.    ED Prescriptions     Medication Sig Dispense Auth. Provider   fluticasone (FLONASE) 50 MCG/ACT nasal spray Place 1 spray into both nostrils 2 (two) times daily. [place], [place]   promethazine-dextromethorphan (PROMETHAZINE-DM) 6.25-15 MG/5ML syrup Take 5 mLs by mouth 4 (four) times daily as needed. 100 mL Volney American, PA-C   albuterol (VENTOLIN HFA) 108 (90 Base) MCG/ACT inhaler Inhale 2 puffs into the lungs every 6 (six) hours as needed for wheezing or shortness of breath. 18 g Volney American, [place]      PDMP not reviewed this encounter.   Volney American, [place] 05/16/21  1529 ° °

## 2021-05-16 NOTE — Discharge Instructions (Addendum)
Use your oxygen continuously throughout the day to keep your oxygen levels up.  Continue to monitor your home oxygen levels and go to the emergency department if your symptoms worsen at any time.  We will be in touch with your COVID and flu testing results.  I have sent over some medications to help with your symptoms.

## 2021-05-16 NOTE — ED Triage Notes (Signed)
Patient states that on Sunday his throat was hurting and his voice was raspy.  Patient states he took 2 amoxicillin on Sunday and it gave him diarrhea so he decided to stop the amoxicillin.   He states he called the doctor back and he ws changed to Doxycycline but he hasn't filled the prescription yet.   He states that his lower back is hurting from the diarrhea   Denies Fever.

## 2021-05-17 LAB — COVID-19, FLU A+B NAA
Influenza A, NAA: NOT DETECTED
Influenza B, NAA: NOT DETECTED
SARS-CoV-2, NAA: DETECTED — AB

## 2021-06-07 ENCOUNTER — Encounter: Payer: Self-pay | Admitting: Cardiology

## 2021-06-07 ENCOUNTER — Encounter: Payer: Self-pay | Admitting: *Deleted

## 2021-06-07 ENCOUNTER — Ambulatory Visit (INDEPENDENT_AMBULATORY_CARE_PROVIDER_SITE_OTHER): Payer: Medicare HMO | Admitting: Cardiology

## 2021-06-07 VITALS — BP 132/72 | HR 72 | Ht 70.0 in | Wt 144.6 lb

## 2021-06-07 DIAGNOSIS — I2510 Atherosclerotic heart disease of native coronary artery without angina pectoris: Secondary | ICD-10-CM

## 2021-06-07 DIAGNOSIS — I10 Essential (primary) hypertension: Secondary | ICD-10-CM | POA: Diagnosis not present

## 2021-06-07 NOTE — Patient Instructions (Signed)

## 2021-06-07 NOTE — Progress Notes (Signed)
Clinical Summary [name] is a 81 y.o.male seen today for follow up of the following medical problems.      1. CAD - history of prior stent  in 9/ 2011 to RCA  He has refused statin. Some renal dysfunction, not on ARB. Has ACE allergy   - no recent chest pain - compliant with meds  2. HTN - higher doses of norvasc caused some leg swelling and dizziness - bp at clinic appts 120/70s - compliant with meds      3. SOB/Pulmonary HTN - 08/2020 echo LVEF 55-60%, grade I dd, normal RV function, severe pulm HTN PASP 72 - PA Leonides Sake had referred to CHF clinic for pulmonary HTN - 10/2020 VQ no PE - 11/2020 CT chest: early/mild interstitial lung disease,  10/2020 PFTs sevre diffusion defect - he is on 2 to 4 L [place]. Has f/u coming up with Dr Melvyn Novas   - uses home O2 as needed       4. Possible ILD - ongoing workup by pulmonary - PFTs with severe diffusion defect, recent abnormal high res CT     5. 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 He says he does OK with shots but gets GI upset with PO penicillins Can take small  doses with no issues   Tramadol Other (See Comments)    Confusion   Lisinopril Rash and Cough    Patient says it caused bp to increase and caused a rash on his legs    Macrodantin [Nitrofurantoin] Rash     Current Outpatient Medications  Medication Sig Dispense Refill   acetaminophen (TYLENOL) 500 MG tablet Take 250 mg by mouth every 6 (six) hours as needed for moderate pain or headache.     albuterol (VENTOLIN HFA) 108 (90 Base) MCG/ACT inhaler Inhale 2 puffs into the lungs every 6 (six) hours as needed for wheezing or shortness of breath. 18 g 0   amLODipine (NORVASC) 5 MG tablet Take 5 mg by mouth daily.     amoxicillin (AMOXIL) 250 MG capsule Take 1 capsule (250 mg total) by mouth daily. 30 capsule 11   aspirin EC 81 MG tablet Take 81 mg by mouth daily.     fluticasone (FLONASE) 50 MCG/ACT nasal spray Place 1 spray into both nostrils 2 (two) times daily. 16 g 2   ibuprofen (  ADVIL) 200 MG tablet Take 200 mg by mouth every 6 (six) hours as needed for moderate pain.     nitroGLYCERIN (NITROSTAT) 0.4 MG SL tablet PLACE 1 TAB UNDER TONGUE EVERY 5 MIN IF NEEDED FOR CHEST PAIN. MAY USE 3 TIMES.NORELIEF CALL 911. 25 tablet 3   Probiotic Product (ALIGN) 4 MG CAPS As directed 30 capsule 0   promethazine-dextromethorphan (PROMETHAZINE-DM) 6.25-15 MG/5ML syrup Take 5 mLs by mouth 4 (four) times daily as needed. 100 mL 0   No current facility-administered medications for this visit.     Past Surgical History:  Procedure Laterality Date   COLONOSCOPY     COLONOSCOPY     CORONARY ANGIOPLASTY  2010   Dr Johnsie Cancel   CYSTOSCOPY WITH LITHOLAPAXY N/A 02/24/2017   Procedure: CYSTOSCOPY WITH LITHOLAPAXY;  Surgeon: Cleon Gustin, MD;  Location: [facility];  Service: Urology;  Laterality: N/A;   CYSTOSCOPY WITH URETHRAL DILATATION  02/24/2017   Procedure: CYSTOSCOPY WITH URETHRAL DILATATION;  Surgeon: Cleon Gustin, MD;  Location: [facility];  Service: Urology;;   DOPPLER ECHOCARDIOGRAPHY     EAR  CYST EXCISION Left 07/23/2012   Procedure: EXCISION LEFT EAR LOBE;  Surgeon: Melissa Montane, MD;  Location: [facility];  Service: ENT;  Laterality: Left;   HOLMIUM LASER APPLICATION N/A 28/41/3244   Procedure: HOLMIUM LASER APPLICATION OF BLADDER CALCULI;  Surgeon: Cleon Gustin, MD;  Location[facility];  Service: Urology;  Laterality: N/A;   IR RADIOLOGIST EVAL & MGMT  11/28/2016     Allergies  Allergen Reactions   Ciprofloxacin Other (See Comments)   Crestor [Rosuvastatin] Other (See Comments)    Unknown    Penicillins Other (See Comments)    Per patient causes gi upset with high doses. He says he does OK with shots but gets GI upset with PO penicillins Can take small doses with no issues   Tramadol Other (See Comments)    Confusion   Lisinopril Rash and Cough    Patient says it caused bp to increase and caused a rash on his legs    Macrodantin [Nitrofurantoin] Rash      No family history on file.   Social History Mr. Kohlmann reports that he quit smoking about 11 years ago. His smoking use included cigarettes. He has a 67.50 pack-year smoking history. He has never used smokeless tobacco. Mr. Cuffe reports no history of alcohol use.   Review of Systems CONSTITUTIONAL: No weight loss, fever, chills, weakness or fatigue.  HEENT: Eyes: No visual loss, blurred vision, double vision or yellow sclerae.No hearing loss, sneezing, congestion, runny nose or sore throat.  SKIN: No rash or itching.  CARDIOVASCULAR: per hpi RESPIRATORY: No shortness of breath, cough or sputum.  GASTROINTESTINAL: No anorexia, nausea, vomiting or diarrhea. No abdominal pain or blood.  GENITOURINARY: No burning on urination, no polyuria NEUROLOGICAL: No headache, dizziness, syncope, paralysis, ataxia, numbness or tingling in the extremities. No change in bowel or bladder control.  MUSCULOSKELETAL: No muscle, back pain, joint pain or stiffness.  LYMPHATICS: No enlarged nodes. No history of splenectomy.   PSYCHIATRIC: No history of depression or anxiety.  ENDOCRINOLOGIC: No reports of sweating, cold or heat intolerance. No polyuria or polydipsia.  Marland Kitchen   Physical Examination Today's Vitals   06/07/21 1333  BP: 132/72  Pulse: 72  SpO2: (!) 88%  Weight: 144 lb 9.6 oz (65.6 kg)  Height: 5\' 10"  (1.778 m)   Body mass index is 20.75 kg/m.  Gen: resting comfortably, no acute distress  HEENT: no scleral icterus, pupils equal round and reactive, no palptable cervical adenopathy,  CV: RRR, no m/r/g, no jvd Resp: Clear to auscultation bilaterally GI: abdomen is soft, non-tender, non-distended, normal bowel sounds, no hepatosplenomegaly MSK: extremities are warm, no edema.  Skin: warm, no rash Neuro:  no focal deficits Psych: appropriate affect   Diagnostic Studies 01/2017 echo Study Conclusions   - Left ventricle: The cavity size was normal. Wall thickness was   increased in a pattern of mild LVH. Systolic function was normal.   The estimated ejection fraction was in the range of 60% to 65%.   Wall motion was normal; there were no regional wall motion   abnormalities. Doppler parameters are consistent with abnormal   left ventricular relaxation (grade 1 diastolic dysfunction). - Aortic valve: There was mild regurgitation. - Mitral valve: There was mild regurgitation. - Tricuspid valve: There was moderate regurgitation. - Pulmonary arteries: PA peak pressure: 38 mm Hg (S).    Assessment and Plan  1. CAD - no symptoms, continue current meds - he has refused statin     2. HTN -he is at goal, continue current meds     3. 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 Defer any further workup to CHF clinic. Given likely etiology primary management would be  treat hypoxia and lung issues      Arnoldo Lenis, M.D.

## 2021-07-02 ENCOUNTER — Ambulatory Visit (HOSPITAL_COMMUNITY)
Admission: RE | Admit: 2021-07-02 | Discharge: 2021-07-02 | Disposition: A | Discharge status: Home / Self Care | Payer: Medicare HMO | Source: Ambulatory Visit | Attending: Urology | Admitting: Urology

## 2021-07-02 ENCOUNTER — Other Ambulatory Visit: Payer: Self-pay

## 2021-07-02 DIAGNOSIS — N2889 Other specified disorders of kidney and ureter: Secondary | ICD-10-CM | POA: Diagnosis not present

## 2021-07-02 DIAGNOSIS — N210 Calculus in bladder: Secondary | ICD-10-CM | POA: Insufficient documentation

## 2021-07-05 ENCOUNTER — Ambulatory Visit (INDEPENDENT_AMBULATORY_CARE_PROVIDER_SITE_OTHER): Payer: Medicare HMO | Admitting: Internal Medicine

## 2021-07-05 ENCOUNTER — Other Ambulatory Visit: Payer: Self-pay

## 2021-07-05 ENCOUNTER — Encounter: Payer: Self-pay | Admitting: Internal Medicine

## 2021-07-05 DIAGNOSIS — R0609 Other forms of dyspnea: Secondary | ICD-10-CM | POA: Diagnosis not present

## 2021-07-05 DIAGNOSIS — R0902 Hypoxemia: Secondary | ICD-10-CM
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
## 2021-07-05 NOTE — Assessment & Plan Note (Signed)
>>>   Check cbc/ bmet today for hct and hc03 levels   F/u in 6 m unless being followed regularly anyway in this clinic by one of our sleep medicine pulmonary docs         Each maintenance medication was reviewed in detail including emphasizing most importantly the difference between maintenance and prns and under what circumstances the prns are to be triggered using an action plan format where appropriate.  Total time for H and P, chart review, counseling, reviewing 02  device(s) and generating customized AVS unique to this office visit / same day charting > 30 min
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
## 2021-07-05 NOTE — Patient Instructions (Addendum)
Please remember to go to the lab department   for your tests - we will call you with the results when they are available.  Make sure you check your oxygen saturation  AT  your highest level of activity (not after you stop)   to be sure it stays over 90% and adjust  02 flow upward to maintain this level if needed but remember to turn it back to previous settings when you stop (to conserve your supply).   Please schedule a follow up visit in 6  months but call sooner if needed  Add: never had sleep medicine eval > re -referred

## 2021-07-06 ENCOUNTER — Telehealth: Payer: Self-pay

## 2021-07-06 LAB — CBC WITH DIFFERENTIAL/PLATELET
Basophils Absolute: 0 10*3/uL (ref 0.0–0.2)
Basos: 1 %
EOS (ABSOLUTE): 0.2 10*3/uL (ref 0.0–0.4)
Eos: 4 %
Hematocrit: 48.1 % (ref 37.5–51.0)
Hemoglobin: 16.4 g/dL (ref 13.0–17.7)
Immature Grans (Abs): 0 10*3/uL (ref 0.0–0.1)
Immature Granulocytes: 1 %
Lymphocytes Absolute: 1.5 10*3/uL (ref 0.7–3.1)
Lymphs: 25 %
MCH: 31 pg (ref 26.6–33.0)
MCHC: 34.1 g/dL (ref 31.5–35.7)
MCV: 91 fL (ref 79–97)
Monocytes Absolute: 0.4 10*3/uL (ref 0.1–0.9)
Monocytes: 7 %
Neutrophils Absolute: 3.8 10*3/uL (ref 1.4–7.0)
Neutrophils: 62 %
Platelets: 180 10*3/uL (ref 150–450)
RBC: 5.29 x10E6/uL (ref 4.14–5.80)
RDW: 13.6 % (ref 11.6–15.4)
WBC: 6.1 10*3/uL (ref 3.4–10.8)

## 2021-07-06 LAB — BASIC METABOLIC PANEL
BUN/Creatinine Ratio: 10 (ref 10–24)
BUN: 13 mg/dL (ref 8–27)
CO2: 25 mmol/L (ref 20–29)
Calcium: 9.2 mg/dL (ref 8.6–10.2)
Chloride: 104 mmol/L (ref 96–106)
Creatinine, Ser: 1.34 mg/dL — ABNORMAL HIGH (ref 0.76–1.27)
Glucose: 80 mg/dL (ref 70–99)
Potassium: 4.8 mmol/L (ref 3.5–5.2)
Sodium: 140 mmol/L (ref 134–144)
eGFR: 53 mL/min/{1.73_m2} — ABNORMAL LOW (ref >59)

## 2021-07-06 NOTE — Telephone Encounter (Signed)
-----   Message from Tanda Rockers, MD sent at 07/05/2021  4:28 PM EST ----- Needs sleep medicine eval with alva/sood first available and in meantime wear his 02 at 2lpm qhs for his heart's benefit

## 2021-07-06 NOTE — Telephone Encounter (Signed)
Spoke to patient when giving lab results and patient states he will continue using the 2lpm at night but is unsure about setting up a sleep consult at this time. He states he will call back to make an appt with one of the sleep doctors.

## 2021-07-09 ENCOUNTER — Ambulatory Visit (INDEPENDENT_AMBULATORY_CARE_PROVIDER_SITE_OTHER): Payer: Medicare HMO | Admitting: Urology

## 2021-07-09 ENCOUNTER — Other Ambulatory Visit: Payer: Self-pay

## 2021-07-09 ENCOUNTER — Encounter: Payer: Self-pay | Admitting: Urology

## 2021-07-09 VITALS — BP 146/68 | HR 102 | Ht 70.0 in | Wt 146.0 lb

## 2021-07-09 DIAGNOSIS — N2889 Other specified disorders of kidney and ureter: Secondary | ICD-10-CM | POA: Diagnosis not present

## 2021-07-09 DIAGNOSIS — N390 Urinary tract infection, site not specified: Secondary | ICD-10-CM

## 2021-07-09 DIAGNOSIS — N210 Calculus in bladder: Secondary | ICD-10-CM

## 2021-07-09 LAB — URINALYSIS, ROUTINE W REFLEX MICROSCOPIC
Bilirubin, UA: NEGATIVE
Glucose, UA: NEGATIVE
Ketones, UA: NEGATIVE
Nitrite, UA: NEGATIVE
Specific Gravity, UA: 1.02 (ref 1.005–1.030)
Urobilinogen, Ur: 1 mg/dL (ref 0.2–1.0)
pH, UA: 5.5 (ref 5.0–7.5)

## 2021-07-09 LAB — MICROSCOPIC EXAMINATION
Renal Epithel, UA: NONE SEEN /hpf
WBC, UA: >30 /hpf — AB (ref 0–5)

## 2021-07-09 NOTE — Progress Notes (Signed)
07/09/2021 11:00 AM   ROXY FILLER [birth year] [id number]  Referring provider: Bernerd Limbo, MD [address] [place],  [place] [address]  Followup UTI and right renal mass   HPI: [name] is a 81yo here for followup for a right renal mass and UTI. No UTI since last visit. Renal mass increased in size to 7.cm on renal US from 07/02/2021. He has lost 30lbs since last visit. He is on O2 at 2-3L. 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    Surgical History: Past Surgical History:  Procedure Laterality Date   COLONOSCOPY     COLONOSCOPY     CORONARY ANGIOPLASTY  2010   Dr Johnsie Cancel   CYSTOSCOPY WITH LITHOLAPAXY N/A 02/24/2017   Procedure: CYSTOSCOPY WITH LITHOLAPAXY;  Surgeon: Cleon Gustin, MD;  Location: [facility];  Service: Urology;  Laterality: N/A;   CYSTOSCOPY WITH URETHRAL DILATATION  02/24/2017   Procedure: CYSTOSCOPY WITH URETHRAL DILATATION;  Surgeon: Cleon Gustin, MD;  Location: [facility];  Service: Urology;;   DOPPLER ECHOCARDIOGRAPHY     EAR CYST EXCISION Left 07/23/2012   Procedure: EXCISION LEFT EAR LOBE;  Surgeon: Melissa Montane, MD;  Location: [facility];  Service: ENT;  Laterality: Left;   HOLMIUM LASER APPLICATION N/A 58/85/0277   Procedure: HOLMIUM LASER APPLICATION OF BLADDER CALCULI;  Surgeon: Cleon Gustin, MD;  Location: [facility];  Service: Urology;  Laterality: N/A;   IR RADIOLOGIST  EVAL & MGMT  11/28/2016    Home Medications:  Allergies as of 07/09/2021       Reactions   Ciprofloxacin Other (See Comments)   Crestor [rosuvastatin] Other (See Comments)   Unknown    Penicillins Other (See Comments)   Per patient causes gi upset with high doses. He says he does OK with shots but gets GI upset with PO penicillins Can take small doses with no issues   Tramadol Other (See Comments)   Confusion   Lisinopril Rash, Cough   Patient says it caused bp to increase and caused a rash on his legs    Macrodantin [nitrofurantoin] Rash        Medication List        Accurate as of July 09, 2021 11:00 AM. If you have any questions, ask your nurse or doctor.          acetaminophen 500 MG tablet Commonly known as: TYLENOL Take 250 mg by mouth every 6 (six) hours as needed for moderate pain or headache.   albuterol 108 (90 Base) MCG/ACT inhaler Commonly known as: VENTOLIN HFA Inhale 2 puffs into the lungs every 6 (six) hours as needed for wheezing or shortness of breath.   Align 4 MG Caps As  directed   amLODipine 5 MG tablet Commonly known as: NORVASC Take 1 tablet by mouth daily.   amLODipine 5 MG tablet Commonly known as: NORVASC Take 5 mg by mouth daily.   amoxicillin 250 MG capsule Commonly known as: AMOXIL Take 1 capsule (250 mg total) by mouth daily.   aspirin EC 81 MG tablet Take 81 mg by mouth daily.   fluticasone 50 MCG/ACT nasal spray Commonly known as: FLONASE Place 1 spray into both nostrils 2 (two) times daily.   ibuprofen 200 MG tablet Commonly known as: ADVIL Take 200 mg by mouth every 6 (six) hours as needed for moderate pain.   nitroGLYCERIN 0.4 MG SL tablet Commonly known as: NITROSTAT PLACE 1 TAB UNDER TONGUE EVERY 5 MIN IF NEEDED FOR CHEST PAIN. MAY USE 3 TIMES.[place] 911.   promethazine-dextromethorphan 6.25-15 MG/5ML syrup Commonly known as: PROMETHAZINE-DM Take 5 mLs by mouth 4 (four) times daily as needed.         Allergies:  Allergies  Allergen Reactions   Ciprofloxacin Other (See Comments)   Crestor [Rosuvastatin] Other (See Comments)    Unknown    Penicillins Other (See Comments)    Per patient causes gi upset with high doses. He says he does OK with shots but gets GI upset with PO penicillins Can take small doses with no issues   Tramadol Other (See Comments)    Confusion   Lisinopril Rash and Cough    Patient says it caused bp to increase and caused a rash on his legs    Macrodantin [Nitrofurantoin] Rash    Family History: No family history on file.  Social History:  reports that he quit smoking about 11 years ago. His smoking use included cigarettes. He has a 67.50 pack-year smoking history. He has never used smokeless tobacco. He reports that he does not drink alcohol and does not use drugs.  ROS: All other review of systems were reviewed and are negative except what is noted above in HPI  Physical Exam: BP (!) 146/68    Pulse (!) 102    Ht 5\' 10"  (1.778 m)    Wt 146 lb (66.2 kg)    BMI 20.95 kg/m   Constitutional:  Alert and oriented, No acute distress. HEENT: [place] AT, moist mucus membranes.  Trachea midline, no masses. Cardiovascular: No clubbing, cyanosis, or edema. Respiratory: Normal respiratory effort, no increased work of breathing. GI: Abdomen is soft, nontender, nondistended, no abdominal masses GU: No CVA tenderness.  Lymph: No cervical or inguinal lymphadenopathy. Skin: No rashes, bruises or suspicious lesions. Neurologic: Grossly intact, no focal deficits, moving all 4 extremities. Psychiatric: Normal mood and affect.  Laboratory Data: Lab Results  Component Value Date   WBC 6.1 07/05/2021   HGB 16.4 07/05/2021   HCT 48.1 07/05/2021   MCV 91 07/05/2021   PLT 180 07/05/2021    Lab Results  Component Value Date   CREATININE 1.34 (H) 07/05/2021    No results found for: PSA  No results found for: TESTOSTERONE  No results found for:  HGBA1C  Urinalysis    Component Value Date/Time   COLORURINE YELLOW 04/04/2020 1925   APPEARANCEUR Cloudy (A) 08/02/2020 1138   LABSPEC 1.016 04/04/2020 1925   PHURINE 6.0 04/04/2020 1925   GLUCOSEU Negative 08/02/2020 1138   HGBUR MODERATE (A) 04/04/2020 1925   BILIRUBINUR small (A) 12/03/2020 1104   BILIRUBINUR Negative 08/02/2020 1138   KETONESUR trace (5) (A) 12/03/2020 1104   KETONESUR NEGATIVE 04/04/2020 1925  PROTEINUR =100 (A) 12/03/2020 1104   PROTEINUR 1+ (A) 08/02/2020 1138   PROTEINUR 100 (A) 04/04/2020 1925   UROBILINOGEN 0.2 12/03/2020 1104   UROBILINOGEN 0.2 07/25/2012 0446   NITRITE Negative 12/03/2020 1104   NITRITE Negative 08/02/2020 1138   NITRITE NEGATIVE 04/04/2020 1925   LEUKOCYTESUR Large (3+) (A) 12/03/2020 1104   LEUKOCYTESUR 3+ (A) 08/02/2020 1138   LEUKOCYTESUR LARGE (A) 04/04/2020 1925    Lab Results  Component Value Date   LABMICR See below: 08/02/2020   WBCUA >30 (A) 08/02/2020   LABEPIT 0-10 08/02/2020   BACTERIA Moderate (A) 08/02/2020    Pertinent Imaging: Renal US 07/02/2021: Images reviewed and discussed with the patient  No results found for this or any previous visit.  No results found for this or any previous visit.  No results found for this or any previous visit.  No results found for this or any previous visit.  Results for orders placed during the hospital encounter of 01/18/21  Ultrasound renal complete  Narrative CLINICAL DATA:  Right renal mass  EXAM: RENAL / URINARY TRACT ULTRASOUND COMPLETE  COMPARISON:  CT 04/05/2019  FINDINGS: Right Kidney:  Renal measurements: 12.3 x 4.8 x 5 cm = volume: 152.8 mL. Echogenicity within normal limits. No hydronephrosis. Solid mass at the lower pole of the right kidney measures approximately 6.2 by 5.4 x 4.6 cm, previous measurements of 5.4 x 4.1 x 5.3 cm on CT.  Left Kidney:  Renal measurements: 10.2 x 4.7 x 4.8 cm = volume: 120.2 mL. Echogenicity within normal  limits. No mass or hydronephrosis visualized.  Bladder:  Bladder appears slightly thick-walled but is incompletely distended. Moderate layering debris within the bladder  Other:  None.  IMPRESSION: 1. Solid mass at the lower pole right kidney measuring up to 6.2 cm in size, likely enlarged as compared with prior CT allowing for limitations of inter modality comparison 2. Slightly thick-walled urinary bladder with moderate debris   Electronically Signed By: Donavan Foil M.D. On: 01/20/2021 21:55  No results found for this or any previous visit.  No results found for this or any previous visit.  No results found for this or any previous visit.   Assessment & Plan:    1. Urinary tract infection without hematuria, site unspecified -resolved  2. Renal mass MRI abd. RTC 4 weeks   No follow-ups on file.  Nicolette Bang, MD  [facility] [place]

## 2021-07-09 NOTE — Patient Instructions (Signed)
Renal Mass  A renal mass is an abnormal growth in the kidney. It may be found while performing an MRI, CT scan, or ultrasound to evaluate other problems of the abdomen. A renal mass that is cancerous (malignant) may grow or spread quickly. Others are not cancerous (benign). Renal masses include: Tumors. These may be malignant or benign. The most common type of kidney cancer in adults is renal cell carcinoma. In children, the most common type of kidney cancer is Wilms tumor. The most common benign tumors of the kidney include renal adenomas, oncocytomas, and angiomyolipoma (AML). Cysts. These are fluid-filled sacs that form on or in the kidney. What are the causes? Certain types of cancers, infections, or injuries can cause a renal mass. It isnot always known what causes a cyst to develop in or on the kidney. What are the signs or symptoms? Often, a renal mass does not cause any signs or symptoms; most kidney cysts donot cause symptoms. How is this diagnosed? Your health care provider may recommend tests to diagnose the cause of your renal mass. These tests may be done if a renal mass is found: Physical exam. Blood tests. Urine tests. Imaging tests, such as ultrasound, CT scan, or MRI. Biopsy. This is a small sample that is removed from the renal mass and tested in a lab. The exact tests and how often they are done will depend on: The size and appearance of the renal mass. Risk factors or medical conditions that increase your risk for problems. Any symptoms associated with the renal mass, or concerns that you have about it. Tests and physical exams may be done once, or they may be done regularly for a period of time. Tests and exams that are done regularly will help monitorwhether the mass is growing and beginning to cause problems. How is this treated? Treatment is not always needed for this condition. Your health care provider may recommend careful monitoring and regular tests and exams.  Treatment willdepend on the cause of the mass. Treatment for a cancerous renal mass may include surgical removal,chemotherapy, radiation, or immunotherapy. Most kidney cysts do not need to be treated. Follow these instructions at home: What you need to do at home will depend on the cause of the mass. Follow the instructions that your health care provider gives to you. In general: Take over-the-counter and prescription medicines only as told by your health care provider. If you were prescribed an antibiotic medicine, take it as told by your health care provider. Do not stop taking the antibiotic even if you start to feel better. Follow any restrictions that are given to you by your health care provider. Keep all follow-up visits. This is important. You may need to see your health care provider once or twice a year to have CT scans and ultrasounds. These tests will show if your renal mass has changed or grown. Contact a health care provider if you: Have pain in your side or back (flank pain). Have a fever. Feel full soon after eating. Have pain or swelling in the abdomen. Lose weight. Get help right away if: Your pain gets worse. There is blood in your urine. You cannot urinate. You have chest pain. You have trouble breathing. These symptoms may represent a serious problem that is an emergency. Do not wait to see if the symptoms will go away. Get medical help right away. Call your local emergency services (911 in the [place] Summary A renal mass is an abnormal growth in the kidney.   It may be cancerous (malignant) and grow or spread quickly, or it may not be cancerous (benign). Renal masses often do not have any signs or symptoms. Renal masses may be found while performing an MRI, CT scan, or ultrasound for other problems of the abdomen. Your health care provider may recommend that you have tests to diagnose the cause of your renal mass. These may include a physical exam, blood tests, urine  tests, imaging, or a biopsy. Treatment is not always needed for this condition. Careful monitoring may be recommended. This information is not intended to replace advice given to you by your health care provider. Make sure you discuss any questions you have with your healthcare provider. Document Revised: 10/25/2019 Document Reviewed: 10/25/2019 Elsevier Patient Education  2022 [employer]  

## 2021-07-17 DIAGNOSIS — J432 Centrilobular emphysema: Secondary | ICD-10-CM | POA: Insufficient documentation

## 2021-07-30 ENCOUNTER — Ambulatory Visit (HOSPITAL_COMMUNITY)
Admission: RE | Admit: 2021-07-30 | Discharge: 2021-07-30 | Disposition: A | Discharge status: Home / Self Care | Payer: Medicare HMO | Source: Ambulatory Visit | Attending: Urology | Admitting: Urology

## 2021-07-30 ENCOUNTER — Other Ambulatory Visit: Payer: Self-pay

## 2021-07-30 DIAGNOSIS — N2889 Other specified disorders of kidney and ureter: Secondary | ICD-10-CM | POA: Diagnosis present

## 2021-07-30 MED ORDER — GADOBUTROL 1 MMOL/ML IV SOLN
7.0000 mL | Freq: Once | INTRAVENOUS | Status: AC | PRN
  Administered 2021-07-30: 7 mL via INTRAVENOUS

## 2021-08-14 ENCOUNTER — Ambulatory Visit: Payer: Medicare HMO | Admitting: Urology

## 2021-08-27 ENCOUNTER — Encounter: Payer: Self-pay | Admitting: Urology

## 2021-08-27 ENCOUNTER — Ambulatory Visit (INDEPENDENT_AMBULATORY_CARE_PROVIDER_SITE_OTHER): Payer: Medicare HMO | Admitting: Urology

## 2021-08-27 VITALS — BP 128/55 | HR 97

## 2021-08-27 DIAGNOSIS — N210 Calculus in bladder: Secondary | ICD-10-CM

## 2021-08-27 DIAGNOSIS — N2889 Other specified disorders of kidney and ureter: Secondary | ICD-10-CM

## 2021-08-27 MED ORDER — AMOXICILLIN 250 MG PO CAPS: 250.0000 mg | ORAL_CAPSULE | Freq: Every day | ORAL | 11 refills | Status: DC

## 2021-08-27 NOTE — Patient Instructions (Signed)

## 2021-08-27 NOTE — Progress Notes (Signed)
? ?08/27/2021 ?9:57 AM  ? ?Angel [name] ?[birth year] ?[id number] ? ?Referring provider: Bernerd Limbo, MD ?[address] ?[address] ?[place],  [place] [address] ? ?Followup right renal mass ? ? ?HPI: ?Angel [name] is a 81yo here for followup for a right renal mass. He underwent MRI abd 07/30/2021 which showed a heterogenous right 6cm renal mass abutting the renal sinus fat. He has multiple pulmonary nodules 5-48m. The patient has a poor performance status and is on 2-3L of home O2. His urination has improved since last visit. Urine stream stronger. No dysuria. No other complaints today ? ? ?PMH: ?Past Medical History:  ?Diagnosis Date  ? Bladder stones   ? CAD (coronary artery disease)   ? BMS RCA 8/11; LVEF 50-55%  ? Cancer ([facility]   ? Deep vein thrombosis (DVT) (HCC)   ? Gall bladder stones   ? GERD (gastroesophageal reflux disease)   ? Gout   ? History of kidney stones   ? HLD (hyperlipidemia)   ? HTN (hypertension)   ? MI ([facility] infarction) ([facility]   ? MSTEMI 8/11  ? Pneumonia 2013  ? Rash, skin   ? on lower legs  ? Renal disorder   ? kidney growth  ? 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 left ear  ? Varicose veins of leg with swelling   ? right leg  ? ? ?Surgical History: ?Past Surgical History:  ?Procedure Laterality Date  ? COLONOSCOPY    ? COLONOSCOPY    ? CORONARY ANGIOPLASTY  2010  ? Dr NJohnsie Cancel ? CYSTOSCOPY WITH LITHOLAPAXY N/A 02/24/2017  ? Procedure: CYSTOSCOPY WITH LITHOLAPAXY;  Surgeon: MCleon Gustin MD;  Location: [facility];  Service: Urology;  Laterality: N/A;  ? CYSTOSCOPY WITH URETHRAL DILATATION  02/24/2017  ? Procedure: CYSTOSCOPY WITH URETHRAL DILATATION;  Surgeon: MCleon Gustin MD;  Location: [facility];  Service: Urology;;  ? DOPPLER ECHOCARDIOGRAPHY    ? EAR CYST EXCISION Left 07/23/2012  ? Procedure: EXCISION LEFT EAR LOBE;  Surgeon: JMelissa Montane MD;  [facility]  Service: ENT;  Laterality: Left;  ? HOLMIUM LASER APPLICATION N/A [id number] ? Procedure: HOLMIUM LASER  APPLICATION OF BLADDER CALCULI;  Surgeon: MCleon Gustin MD;  Location: [facility];  Service: Urology;  Laterality: N/A;  ? IR RADIOLOGIST EVAL & MGMT  11/28/2016  ? ? ?Home Medications:  ?Allergies as of 08/27/2021   ? ?   Reactions  ? Ciprofloxacin Other (See Comments)  ? Crestor [rosuvastatin] Other (See Comments)  ? Unknown   ? Penicillins Other (See Comments)  ? Per patient causes gi upset with high doses. He says he does OK with shots but gets GI upset with PO penicillins ?Can take small doses with no issues  ? Tramadol Other (See Comments)  ? Confusion  ? Lisinopril Rash, Cough  ? Patient says it caused bp to increase and caused a rash on his legs   ? Macrodantin [nitrofurantoin] Rash  ? ?  ? ?  ?Medication List  ?  ? ?  ? Accurate as of August 27, 2021  9:57 AM. If you have any questions, ask your nurse or doctor.  ?  ?  ? ?  ? ?acetaminophen 500 MG tablet ?Commonly known as: TYLENOL ?Take 250 mg by mouth every 6 (six) hours as needed for moderate pain or headache. ?  ?albuterol 108 (90 Base) MCG/ACT inhaler ?Commonly known as: VENTOLIN HFA ?Inhale 2 puffs into the lungs every 6 (  six) hours as needed for wheezing or shortness of breath. ?  ?Align 4 MG Caps ?As directed ?  ?amLODipine 5 MG tablet ?Commonly known as: NORVASC ?Take 1 tablet by mouth daily. ?  ?amLODipine 5 MG tablet ?Commonly known as: NORVASC ?Take 5 mg by mouth daily. ?  ?amoxicillin 250 MG capsule ?Commonly known as: AMOXIL ?Take 1 capsule (250 mg total) by mouth daily. ?  ?aspirin EC 81 MG tablet ?Take 81 mg by mouth daily. ?  ?fluticasone 50 MCG/ACT nasal spray ?Commonly known as: FLONASE ?Place 1 spray into both nostrils 2 (two) times daily. ?  ?ibuprofen 200 MG tablet ?Commonly known as: ADVIL ?Take 200 mg by mouth every 6 (six) hours as needed for moderate pain. ?  ?nitroGLYCERIN 0.4 MG SL tablet ?Commonly known as: NITROSTAT ?PLACE 1 TAB UNDER TONGUE EVERY 5 MIN IF NEEDED FOR CHEST PAIN. MAY USE 3 TIMES.NORELIEF CALL 911. ?   ?promethazine-dextromethorphan 6.25-15 MG/5ML syrup ?Commonly known as: PROMETHAZINE-DM ?Take 5 mLs by mouth 4 (four) times daily as needed. ?  ? ?  ? ? ?Allergies:  ?Allergies  ?Allergen Reactions  ? Ciprofloxacin Other (See Comments)  ? Crestor [Rosuvastatin] Other (See Comments)  ?  Unknown   ? Penicillins Other (See Comments)  ?  Per patient causes gi upset with high doses. He says he does OK with shots but gets GI upset with PO penicillins ?Can take small doses with no issues  ? Tramadol Other (See Comments)  ?  Confusion  ? Lisinopril Rash and Cough  ?  Patient says it caused bp to increase and caused a rash on his legs   ? Macrodantin [Nitrofurantoin] Rash  ? ? ?Family History: ?No family history on file. ? ?Social History:  reports that he quit smoking about 11 years ago. His smoking use included cigarettes. He has a 67.50 pack-year smoking history. He has never used smokeless tobacco. He reports that he does not drink alcohol and does not use drugs. ? ?ROS: ?All other review of systems were reviewed and are negative except what is noted above in HPI ? ?Physical Exam: ?BP (!) 128/55   Pulse 97   ?Constitutional:  Alert and oriented, No acute distress. ?HEENT: [place] AT, moist mucus membranes.  Trachea midline, no masses. ?Cardiovascular: No clubbing, cyanosis, or edema. ?Respiratory: Normal respiratory effort, no increased work of breathing. ?GI: Abdomen is soft, nontender, nondistended, no abdominal masses ?GU: No CVA tenderness.  ?Lymph: No cervical or inguinal lymphadenopathy. ?Skin: No rashes, bruises or suspicious lesions. ?Neurologic: Grossly intact, no focal deficits, moving all 4 extremities. ?Psychiatric: Normal mood and affect. ? ?Laboratory Data: ?Lab Results  ?Component Value Date  ? WBC 6.1 07/05/2021  ? HGB 16.4 07/05/2021  ? HCT 48.1 07/05/2021  ? MCV 91 07/05/2021  ? PLT 180 07/05/2021  ? ? ?Lab Results  ?Component Value Date  ? CREATININE 1.34 (H) 07/05/2021  ? ? ?No results found for:  PSA ? ?No results found for: TESTOSTERONE ? ?No results found for: HGBA1C ? ?Urinalysis ?   ?Component Value Date/Time  ? COLORURINE YELLOW 04/04/2020 1925  ? APPEARANCEUR Cloudy (A) 07/09/2021 1042  ? LABSPEC 1.016 04/04/2020 1925  ? PHURINE 6.0 04/04/2020 1925  ? GLUCOSEU Negative 07/09/2021 1042  ? HGBUR MODERATE (A) 04/04/2020 1925  ? BILIRUBINUR Negative 07/09/2021 1042  ? KETONESUR trace (5) (A) 12/03/2020 1104  ? Benjamin Stain NEGATIVE 04/04/2020 1925  ? PROTEINUR 1+ (A) 07/09/2021 1042  ? PROTEINUR 100 (A) 04/04/2020 1925  ?  UROBILINOGEN 0.2 12/03/2020 1104  ? UROBILINOGEN 0.2 07/25/2012 0446  ? NITRITE Negative 07/09/2021 1042  ? NITRITE NEGATIVE 04/04/2020 1925  ? LEUKOCYTESUR 3+ (A) 07/09/2021 1042  ? LEUKOCYTESUR LARGE (A) 04/04/2020 1925  ? ? ?Lab Results  ?Component Value Date  ? LABMICR See below: 07/09/2021  ? [place] >30 (A) 07/09/2021  ? LABEPIT 0-10 07/09/2021  ? BACTERIA Many (A) 07/09/2021  ? ? ?Pertinent Imaging: ?MRi 07/30/2021: Images reviewed and discussed with the patient ?No results found for this or any previous visit. ? ?No results found for this or any previous visit. ? ?No results found for this or any previous visit. ? ?No results found for this or any previous visit. ? ?Results for orders placed during the hospital encounter of 01/18/21 ? ?Ultrasound renal complete ? ?Narrative ?CLINICAL DATA:  Right renal mass ? ?EXAM: ?RENAL / URINARY TRACT ULTRASOUND COMPLETE ? ?COMPARISON:  CT 04/05/2019 ? ?FINDINGS: ?Right Kidney: ? ?Renal measurements: 12.3 x 4.8 x 5 cm = volume: 152.8 mL. ?Echogenicity within normal limits. No hydronephrosis. Solid mass at ?the lower pole of the right kidney measures approximately 6.2 by 5.4 ?x 4.6 cm, previous measurements of 5.4 x 4.1 x 5.3 cm on CT. ? ?Left Kidney: ? ?Renal measurements: 10.2 x 4.7 x 4.8 cm = volume: 120.2 mL. ?Echogenicity within normal limits. No mass or hydronephrosis ?visualized. ? ?Bladder: ? ?Bladder appears slightly thick-walled but is  incompletely distended. ?Moderate layering debris within the bladder ? ?Other: ? ?None. ? ?IMPRESSION: ?1. Solid mass at the lower pole right kidney measuring up to 6.2 cm ?in size, likely enlarged as compared with prior CT allowing

## 2021-09-25 DIAGNOSIS — R600 Localized edema: Secondary | ICD-10-CM | POA: Insufficient documentation

## 2021-10-26 ENCOUNTER — Telehealth: Payer: Self-pay

## 2021-10-26 NOTE — Telephone Encounter (Signed)
Order form from [employer] faxed over for Dr. Melvyn Novas to sign on Monday. Will fax back once completed.   Dr. Melvyn Novas routing to you so you are aware

## 2021-10-29 NOTE — Telephone Encounter (Signed)
done

## 2021-11-16 ENCOUNTER — Ambulatory Visit: Payer: Medicare HMO | Admitting: Internal Medicine

## 2021-11-16 ENCOUNTER — Ambulatory Visit (INDEPENDENT_AMBULATORY_CARE_PROVIDER_SITE_OTHER): Payer: Medicare HMO | Admitting: Internal Medicine

## 2021-11-16 ENCOUNTER — Telehealth: Payer: Self-pay | Admitting: Internal Medicine

## 2021-11-16 ENCOUNTER — Encounter: Payer: Self-pay | Admitting: Internal Medicine

## 2021-11-16 DIAGNOSIS — J9611 Chronic respiratory failure with hypoxia: Secondary | ICD-10-CM | POA: Diagnosis not present

## 2021-11-16 DIAGNOSIS — R0902 Hypoxemia: Secondary | ICD-10-CM

## 2021-11-16 DIAGNOSIS — I272 Pulmonary hypertension, unspecified: Secondary | ICD-10-CM

## 2021-11-16 DIAGNOSIS — R9389 Abnormal findings on diagnostic imaging of other specified body structures: Secondary | ICD-10-CM | POA: Diagnosis not present
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
## 2021-11-16 NOTE — Patient Instructions (Addendum)
We will refer you to sleep medicine evaluation here   Make sure you check your oxygen saturation  AT  your highest level of activity (not after you stop)   to be sure it stays over 90% and adjust  02 flow upward to maintain this level if needed but remember to turn it back to previous settings when you stop (to conserve your supply).   Call [employer] to see if you can switch to them for both the ambulatory and stationery 02   We will order humidity for your 02 to see if helps your nasal symptoms otherwise will need a mask at bedtime     Please schedule a follow up visit in 6 months but call sooner if needed  Late add: Rec Repeat HRCT dx chronic resp failure hypoxemic Increase noct 02 to 3lpm until seen by sleep medicine

## 2021-11-16 NOTE — Telephone Encounter (Signed)
OV notes from todays visit and demographics sheet sent to fax [phone]. Nothing further needed for now.

## 2021-11-17 ENCOUNTER — Encounter: Payer: Self-pay | Admitting: Internal Medicine

## 2021-11-17 ENCOUNTER — Telehealth: Payer: Self-pay | Admitting: Internal Medicine

## 2021-11-17 DIAGNOSIS — I272 Pulmonary hypertension, unspecified: Secondary | ICD-10-CM | POA: Insufficient documentation

## 2021-11-17 NOTE — Assessment & Plan Note (Signed)
HRCT 11/14/20 >>> non diagnotic for UIP  / mpns - 11/16/2021 rec repeat HRCT >>>  Discussed in detail all the  indications, usual  risks and alternatives  relative to the benefits with patient who agrees to proceed with w/u as outlined.

## 2021-11-17 NOTE — Assessment & Plan Note (Addendum)
Echo 08/29/20 1. LVEF  55 to 60%. With Grade 1 diastolic dysfunction  2. Right ventricular systolic function is normal. The right ventricular  size is normal. There is severely elevated PAS 3. The mitral valve is normal in structure. Trivial mitral valve  regurgitation. No evidence of mitral stenosis.  4. Tricuspid valve regurgitation is mild to moderate.  5. The aortic valve is tricuspid. There is mild calcification of the  aortic valve. MILD AI  There is mild thickening of the aortic valve  No AS    6. 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 With nl LA and RA 7. The inferior vena cava is normal in size with greater than 50%  respiratory variability, suggesting right atrial pressure of 3 mmHg.   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  In meantime need to do more to assure 02 sat stays > 02 at all times           Each maintenance medication was reviewed in detail including emphasizing most importantly the difference between maintenance and prns and under what circumstances the prns are to be triggered using an action plan format where appropriate.  Total time for H and P, chart review, counseling, reviewing 02 device(s) and generating customized AVS unique to this office visit / same day charting > 30 min very complex pt

## 2021-11-17 NOTE — Telephone Encounter (Unsigned)
After chart review needs  Hrct dx chronic hypoxemic resp failure Increase 02 to 3lpm hs until sees sleep med  Ok to do 6 min walk to qualify for POC
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
## 2021-11-20 NOTE — Telephone Encounter (Signed)
Scheduled patient for 6 minute walk in [place] office.

## 2021-11-20 NOTE — Telephone Encounter (Signed)
pt is requesting to do a 57mn walk so he can have a poc.  Dr. WMelvyn Novasare you okay with [place]cheduling patient for 6 min walk?

## 2021-11-23 ENCOUNTER — Ambulatory Visit (INDEPENDENT_AMBULATORY_CARE_PROVIDER_SITE_OTHER): Payer: Medicare HMO | Admitting: Internal Medicine

## 2021-11-23 DIAGNOSIS — J9611 Chronic respiratory failure with hypoxia: Secondary | ICD-10-CM | POA: Diagnosis not present

## 2021-11-27 ENCOUNTER — Telehealth: Payer: Self-pay | Admitting: Internal Medicine

## 2021-11-27 NOTE — Telephone Encounter (Signed)
Walk test documentation sent to [employer]. Nothing further needed

## 2021-11-30 ENCOUNTER — Telehealth: Payer: Self-pay | Admitting: Internal Medicine

## 2021-11-30 NOTE — Telephone Encounter (Signed)
Legrand Como with [employer] called stating that they received the order for pt to receive a POC but states that they do not have the actual qualifying walk that was done.  I tried to see if I could locate this walk but was not able to find it. Order on 7/14 was placed by April Smith, RN. Routing this to April for her to review to see if she can find the walk info. Please advise.

## 2021-11-30 NOTE — Telephone Encounter (Signed)
Faxed walk again. Nothing further needed

## 2021-12-05 ENCOUNTER — Ambulatory Visit (INDEPENDENT_AMBULATORY_CARE_PROVIDER_SITE_OTHER): Payer: Medicare HMO | Admitting: Cardiology

## 2021-12-05 ENCOUNTER — Encounter: Payer: Self-pay | Admitting: Cardiology

## 2021-12-05 VITALS — BP 130/78 | HR 79 | Ht 70.0 in | Wt 149.4 lb

## 2021-12-05 DIAGNOSIS — I10 Essential (primary) hypertension: Secondary | ICD-10-CM | POA: Diagnosis not present

## 2021-12-05 DIAGNOSIS — I2510 Atherosclerotic heart disease of native coronary artery without angina pectoris: Secondary | ICD-10-CM | POA: Diagnosis not present

## 2021-12-05 DIAGNOSIS — I272 Pulmonary hypertension, unspecified: Secondary | ICD-10-CM

## 2021-12-05 NOTE — Progress Notes (Signed)
Clinical Summary Angel [name] is a 81 y.o.male seen today for follow up of the following medical problems.      1. CAD - history of prior stent  in 9/ 2011 to RCA  He has refused statin. Some renal dysfunction, not on ARB. Has ACE allergy as well   - no chest pains    2. HTN - higher doses of norvasc caused some leg swelling and dizziness -compliant with meds         3. SOB/Pulmonary HTN - 08/2020 echo LVEF 55-60%, grade I dd, normal RV function, severe pulm HTN PASP 72 - PA Leonides Sake had referred to CHF clinic for pulmonary HTN - 10/2020 VQ no PE - 11/2020 CT chest: early/mild interstitial lung disease,  10/2020 PFTs sevre diffusion defect - he is on 2 to 4 L . Has f/u coming up with Dr Melvyn Novas    - chronic stable SOB. He is on home O2 2-3 L       4. 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 He says he does OK with shots but gets GI upset with PO penicillins Can take small doses with no issues   Tramadol Other (See Comments)    Confusion   Lisinopril Rash and Cough    Patient says it caused bp to increase and caused a rash  on his legs    Macrodantin [Nitrofurantoin] Rash     Current Outpatient Medications  Medication Sig Dispense Refill   acetaminophen (TYLENOL) 500 MG tablet Take 250 mg by mouth every 6 (six) hours as needed for moderate pain or headache.     albuterol (VENTOLIN HFA) 108 (90 Base) MCG/ACT inhaler Inhale 2 puffs into the lungs every 6 (six) hours as needed for wheezing or shortness of breath. 18 g 0   aspirin EC 81 MG tablet Take 81 mg by mouth daily.     fluticasone (FLONASE) 50 MCG/ACT nasal spray Place 1 spray into both nostrils 2 (two) times daily. 16 g 2   nitroGLYCERIN (NITROSTAT) 0.4 MG SL tablet PLACE 1 TAB UNDER TONGUE EVERY 5 MIN IF NEEDED FOR CHEST PAIN. MAY USE 3 TIMES.NORELIEF CALL 911. 25 tablet 3   No current facility-administered medications for this visit.     Past Surgical History:  Procedure Laterality Date   COLONOSCOPY     COLONOSCOPY  CORONARY ANGIOPLASTY  2010   Dr Johnsie Cancel   CYSTOSCOPY WITH LITHOLAPAXY N/A 02/24/2017   Procedure: CYSTOSCOPY WITH LITHOLAPAXY;  Surgeon: Cleon Gustin, MD;  [facility];  Service: Urology;  Laterality: N/A;   CYSTOSCOPY WITH URETHRAL DILATATION  02/24/2017   Procedure: CYSTOSCOPY WITH URETHRAL DILATATION;  Surgeon: Cleon Gustin, MD;  Location: [facility];  Service: Urology;;   DOPPLER ECHOCARDIOGRAPHY     EAR CYST EXCISION Left 07/23/2012   Procedure: EXCISION LEFT EAR LOBE;  Surgeon: Melissa Montane, MD;  Location: [facility];  Service: ENT;  Laterality: Left;   HOLMIUM LASER APPLICATION N/A 40/81/4481   Procedure: HOLMIUM LASER APPLICATION OF BLADDER CALCULI;  Surgeon: Cleon Gustin, MD;  [facility]: [facility];  Service: Urology;  Laterality: N/A;   IR RADIOLOGIST EVAL & MGMT  11/28/2016     Allergies  Allergen Reactions   Ciprofloxacin Other (See Comments)   Crestor [Rosuvastatin] Other (See Comments)    Unknown    Penicillins Other (See Comments)    Per patient causes gi upset with high doses. He says he does OK  with shots but gets GI upset with PO penicillins Can take small doses with no issues   Tramadol Other (See Comments)    Confusion   Lisinopril Rash and Cough    Patient says it caused bp to increase and caused a rash on his legs    Macrodantin [Nitrofurantoin] Rash      No family history on file.   Social History Angel [name] reports that he quit smoking about 11 years ago. His smoking use included cigarettes. He has a 67.50 pack-year smoking history. He has never used smokeless tobacco. Angel [name] reports no history of alcohol use.   Review of Systems CONSTITUTIONAL: No weight loss, fever, chills, weakness or fatigue.  HEENT: Eyes: No visual loss, blurred vision, double vision or yellow sclerae.No hearing loss, sneezing, congestion, runny nose or sore throat.  SKIN: No rash or itching.  CARDIOVASCULAR: per hpi RESPIRATORY: per hpi GASTROINTESTINAL: No anorexia, nausea, vomiting or diarrhea. No abdominal pain or blood.  GENITOURINARY: No burning on urination, no polyuria NEUROLOGICAL: No headache, dizziness, syncope, paralysis, ataxia, numbness or tingling in the extremities. No change in bowel or bladder control.  MUSCULOSKELETAL: No muscle, back pain, joint pain or stiffness.  LYMPHATICS: No enlarged nodes. No history of splenectomy.  PSYCHIATRIC: No history of depression or anxiety.  ENDOCRINOLOGIC: No reports of sweating, cold or heat intolerance. No polyuria or polydipsia.  Marland Kitchen   Physical Examination Today's Vitals   12/05/21 1348  BP: 130/78  Pulse: 79  SpO2: 95%  Weight: 149 lb 6.4 oz (67.8 kg)  Height: '5\' 10"'$  (1.778 m)   Body mass index is 21.44 kg/m.  Gen: resting comfortably, no acute distress HEENT: no scleral icterus, pupils equal round and reactive, no palptable cervical adenopathy,  CV: RRR, 2/6 systolic murmur LLSB Resp: Clear to auscultation bilaterally GI: abdomen is soft, non-tender, non-distended, normal bowel sounds, no hepatosplenomegaly MSK:  extremities are warm, no edema.  Skin: warm, no rash Neuro:  no focal deficits Psych: appropriate affect   Diagnostic Studies 01/2017 echo Study Conclusions   - Left ventricle: The cavity size was normal. Wall thickness was   increased in a pattern of mild LVH. Systolic function was normal.   The estimated ejection fraction was in the range of 60% to 65%.   Wall motion was normal; there were no regional wall motion   abnormalities. Doppler parameters are consistent with  abnormal   left ventricular relaxation (grade 1 diastolic dysfunction). - Aortic valve: There was mild regurgitation. - Mitral valve: There was mild regurgitation. - Tricuspid valve: There was moderate regurgitation. - Pulmonary arteries: PA peak pressure: 38 mm Hg (S).    Assessment and Plan  1. CAD - no recent symptoms, continue current meds     2. 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 - primary management would be treatment for his chronic lung disease and hypoxia, no clear role for pulmonary vasodilators.       Arnoldo Lenis, M.D.

## 2021-12-05 NOTE — Patient Instructions (Signed)
Medication Instructions:  Continue all current medications.   Labwork: none  Testing/Procedures: none  Follow-Up: 6 months   Any Other Special Instructions Will Be Listed Below (If Applicable).   If you need a refill on your cardiac medications before your next appointment, please call your pharmacy.  

## 2022-01-29 ENCOUNTER — Institutional Professional Consult (permissible substitution): Payer: Medicare HMO | Admitting: Pulmonary Disease

## 2022-02-15 ENCOUNTER — Ambulatory Visit (HOSPITAL_COMMUNITY)
Admission: RE | Admit: 2022-02-15 | Discharge: 2022-02-15 | Disposition: A | Discharge status: Home / Self Care | Payer: Medicare HMO | Source: Ambulatory Visit | Attending: Urology | Admitting: Urology

## 2022-02-15 DIAGNOSIS — N2889 Other specified disorders of kidney and ureter: Secondary | ICD-10-CM | POA: Insufficient documentation

## 2022-03-01 ENCOUNTER — Encounter: Payer: Self-pay | Admitting: Urology

## 2022-03-01 ENCOUNTER — Ambulatory Visit (INDEPENDENT_AMBULATORY_CARE_PROVIDER_SITE_OTHER): Payer: Medicare HMO | Admitting: Urology

## 2022-03-01 VITALS — BP 120/65 | HR 89

## 2022-03-01 DIAGNOSIS — N2889 Other specified disorders of kidney and ureter: Secondary | ICD-10-CM | POA: Diagnosis not present

## 2022-03-01 DIAGNOSIS — N210 Calculus in bladder: Secondary | ICD-10-CM

## 2022-03-01 NOTE — Patient Instructions (Signed)

## 2022-03-01 NOTE — Progress Notes (Unsigned)
03/01/2022 10:59 AM   Angel [name] 1941-02-28 [id number]  Referring provider: Bernerd Limbo, MD [address] [place],  [place] [id number]  No chief complaint on file.   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    Surgical History: Past Surgical History:  Procedure Laterality Date   COLONOSCOPY     COLONOSCOPY     CORONARY ANGIOPLASTY  2010   Dr Johnsie Cancel   CYSTOSCOPY WITH LITHOLAPAXY N/A 02/24/2017   Procedure: CYSTOSCOPY WITH LITHOLAPAXY;  Surgeon: Cleon Gustin, MD;  [facility];  Service: Urology;  Laterality: N/A;   CYSTOSCOPY WITH URETHRAL DILATATION  02/24/2017   Procedure: CYSTOSCOPY WITH URETHRAL DILATATION;  Surgeon: Cleon Gustin, MD;  [facility];  Service: Urology;;   DOPPLER ECHOCARDIOGRAPHY     EAR CYST EXCISION Left 07/23/2012   Procedure: EXCISION LEFT EAR LOBE;  Surgeon: Melissa Montane, MD;  Location: [facility];  Service: ENT;  Laterality: Left;   HOLMIUM LASER APPLICATION N/A 09/73/5329   Procedure: HOLMIUM LASER APPLICATION OF BLADDER CALCULI;  Surgeon: Cleon Gustin, MD;  Location[facility];  Service: Urology;  Laterality: N/A;   IR RADIOLOGIST EVAL & MGMT  11/28/2016    Home Medications:  Allergies as of 03/01/2022       Reactions   Ciprofloxacin Other (See Comments)   Crestor [rosuvastatin] Other (See Comments)   Unknown    Penicillins Other (See Comments)   Per patient causes  gi upset with high doses. He says he does OK with shots but gets GI upset with PO penicillins Can take small doses with no issues   Tramadol Other (See Comments)   Confusion   Lisinopril Rash, Cough   Patient says it caused bp to increase and caused a rash on his legs    Macrodantin [nitrofurantoin] Rash        Medication List        Accurate as of March 01, 2022 10:59 AM. If you have any questions, ask your nurse or doctor.          acetaminophen 500 MG tablet Commonly known as: TYLENOL Take 250 mg by mouth every 6 (six) hours as needed for moderate pain or headache.   albuterol 108 (90 Base) MCG/ACT inhaler Commonly known as: VENTOLIN HFA Inhale 2 puffs into the lungs every 6 (six) hours as needed for wheezing or shortness of breath.   aspirin EC 81 MG tablet Take 81 mg by mouth daily.   fluticasone 50 MCG/ACT nasal spray Commonly known as: FLONASE Place 1 spray into both nostrils 2 (two) times daily.   nitroGLYCERIN 0.4 MG SL tablet Commonly known as: NITROSTAT PLACE 1 TAB UNDER TONGUE EVERY 5 MIN IF NEEDED FOR  CHEST PAIN. MAY USE 3 TIMES.[facility] 911.        Allergies:  Allergies  Allergen Reactions   Ciprofloxacin Other (See Comments)   Crestor [Rosuvastatin] Other (See Comments)    Unknown    Penicillins Other (See Comments)    Per patient causes gi upset with high doses. He says he does OK with shots but gets GI upset with PO penicillins Can take small doses with no issues   Tramadol Other (See Comments)    Confusion   Lisinopril Rash and Cough    Patient says it caused bp to increase and caused a rash on his legs    Macrodantin [Nitrofurantoin] Rash    Family History: No family history on file.  Social History:  reports that he quit smoking about 12 years ago. His smoking use included cigarettes. He has a 67.50 pack-year smoking history. He has never used smokeless tobacco. He reports that he does not drink alcohol and does not use  drugs.  ROS: All other review of systems were reviewed and are negative except what is noted above in HPI  Physical Exam: BP 120/65   Pulse 89   Constitutional:  Alert and oriented, No acute distress. HEENT: [place] AT, moist mucus membranes.  Trachea midline, no masses. Cardiovascular: No clubbing, cyanosis, or edema. Respiratory: Normal respiratory effort, no increased work of breathing. GI: Abdomen is soft, nontender, nondistended, no abdominal masses GU: No CVA tenderness.  Lymph: No cervical or inguinal lymphadenopathy. Skin: No rashes, bruises or suspicious lesions. Neurologic: Grossly intact, no focal deficits, moving all 4 extremities. Psychiatric: Normal mood and affect.  Laboratory Data: Lab Results  Component Value Date   WBC 6.1 07/05/2021   HGB 16.4 07/05/2021   HCT 48.1 07/05/2021   MCV 91 07/05/2021   PLT 180 07/05/2021    Lab Results  Component Value Date   CREATININE 1.34 (H) 07/05/2021    No results found for: "PSA"  No results found for: "TESTOSTERONE"  No results found for: "HGBA1C"  Urinalysis    Component Value Date/Time   COLORURINE YELLOW 04/04/2020 1925   APPEARANCEUR Cloudy (A) 07/09/2021 1042   LABSPEC 1.016 04/04/2020 1925   PHURINE 6.0 04/04/2020 1925   GLUCOSEU Negative 07/09/2021 1042   HGBUR MODERATE (A) 04/04/2020 1925   BILIRUBINUR Negative 07/09/2021 1042   KETONESUR trace (5) (A) 12/03/2020 1104   KETONESUR NEGATIVE 04/04/2020 1925   PROTEINUR 1+ (A) 07/09/2021 1042   PROTEINUR 100 (A) 04/04/2020 1925   UROBILINOGEN 0.2 12/03/2020 1104   UROBILINOGEN 0.2 07/25/2012 0446   NITRITE Negative 07/09/2021 1042   NITRITE NEGATIVE 04/04/2020 1925   LEUKOCYTESUR 3+ (A) 07/09/2021 1042   LEUKOCYTESUR LARGE (A) 04/04/2020 1925    Lab Results  Component Value Date   LABMICR See below: 07/09/2021   WBCUA >30 (A) 07/09/2021   LABEPIT 0-10 07/09/2021   BACTERIA Many (A) 07/09/2021    Pertinent Imaging: *** No results found for  this or any previous visit.  No results found for this or any previous visit.  No results found for this or any previous visit.  No results found for this or any previous visit.  Results for orders placed during the hospital encounter of 02/15/22  Ultrasound renal complete  Narrative CLINICAL DATA:  Known right renal mass.  EXAM: RENAL / URINARY TRACT ULTRASOUND COMPLETE  COMPARISON:  Renal ultrasound stated January 18, 2021 an July 02, 2021. Abdominal MRI July 30, 2021.  FINDINGS: Right Kidney:  Renal measurements: 12.5  x 4.6 x 5.9 cm = volume: 178 mL. The known solid mass in the lower pole of the right kidney measures 8.4 x 5.7 x 8.0 cm today versus 7.7 x 6.7 x 7.3 cm on the previous renal ultrasound from July 02, 2021.  Left Kidney:  Renal measurements: 9.9 x 4.5 x 5.1 cm. = volume: 118 mL. Echogenicity within normal limits. No mass or hydronephrosis visualized.  Bladder:  The bladder is decompressed limiting evaluation.  Other:  None.  IMPRESSION: 1. The known solid mass in the lower pole of the right kidney measures 8.4 x 5.7 x 8.0 cm today versus 7.7 x 6.7 x 7.3 cm on the previous renal ultrasound from July 02, 2021. 2. The left kidney is unremarkable. 3. The bladder is decompressed limiting evaluation.   Electronically Signed By: Dorise Bullion III M.D. On: 02/15/2022 16:55  No valid procedures specified. No results found for this or any previous visit.  No results found for this or any previous visit.   Assessment & Plan:    1. Renal mass -We discussed the natural hx of renal masses and the 80/20 malignant/benign likelihood. We disucssed the treatment options including active surveillance. Renal ablation, partial and radical nephrectomy.  After discussing the options the patient elects for continued surveillance. RTC 6 months with renal US  2. Calculus of bladder ***   No follow-ups on file.  Nicolette Bang, MD  [facility] [place]

## 2022-03-20 ENCOUNTER — Ambulatory Visit
Admission: EM | Admit: 2022-03-20 | Discharge: 2022-03-20 | Disposition: A | Discharge status: Home / Self Care | Payer: Medicare HMO | Attending: Family Medicine | Admitting: Family Medicine

## 2022-03-20 DIAGNOSIS — H1131 Conjunctival hemorrhage, right eye: Secondary | ICD-10-CM | POA: Diagnosis not present

## 2022-03-20 MED ORDER — SYSTANE 0.4-0.3 % OP GEL: 1.0000 | Freq: Two times a day (BID) | OPHTHALMIC | 0 refills | Status: DC | PRN

## 2022-03-20 NOTE — ED Triage Notes (Addendum)
Pt reports he was working on a mower  yesterday says nothing flew in his eye however his right eye is very dark red.     About 7 or  8 years ago an acorn flew up and hit his right eye.   Visual Acuity test:  Right eye: 20/100 Left eye 20/60  Both eyes 20/50

## 2022-03-20 NOTE — ED Provider Notes (Signed)
[facility]    CSN: [id number] Arrival date & time: 03/20/22  1156      History   Chief Complaint No chief complaint on file.   HPI Angel [name] is a 81 y.o. male.   Patient presenting today with 1 day history of right eye redness.  He denies any known injury to the eye but states he was working on a lawnmower and leaning down putting pressure on his left side straining to get some bolts, unsure if this may have irritated his eye.  He denies headache, fever, nausea, vomiting, obvious visual change, drainage from the eye.  States he has had decreased vision in this eye since an incident 7 or 8 years ago where he ruptured his globe from an acorn hitting him in the eye while mowing.  Last ophthalmology visit for full eye exam was 1 month ago with stable findings other than some increased pressure in the left eye and a referral for cataract evaluation.  So far not trying anything over-the-counter for symptoms.    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    Patient Active Problem List   Diagnosis Date Noted   Pulmonary hypertension ([place]) 11/17/2021   DOE (dyspnea on exertion) 07/05/2021   Abnormal CT of the chest 11/16/2020   Chronic respiratory failure with hypoxia ([place]) 10/19/2020   Acute cystitis without hematuria 07/07/2019   Calculus of bladder 07/07/2019   BPH (benign prostatic hyperplasia) 02/24/2017   ABDOMINAL PAIN-GENERALIZED 02/16/2010    ESSENTIAL HYPERTENSION, BENIGN 02/02/2010   ELEVATED BLOOD PRESSURE 01/31/2010   MIXED HYPERLIPIDEMIA 01/18/2010   CORONARY ATHEROSCLEROSIS NATIVE CORONARY ARTERY 01/18/2010   FATIGUE 01/18/2010    Past Surgical History:  Procedure Laterality Date   COLONOSCOPY     COLONOSCOPY     CORONARY ANGIOPLASTY  2010   Dr Johnsie Cancel   CYSTOSCOPY WITH LITHOLAPAXY N/A 02/24/2017   Procedure: CYSTOSCOPY WITH LITHOLAPAXY;  Surgeon: Cleon Gustin, MD;  Location[facility];  Service: Urology;  Laterality: N/A;   CYSTOSCOPY WITH URETHRAL DILATATION  02/24/2017   Procedure: CYSTOSCOPY WITH URETHRAL DILATATION;  Surgeon: Cleon Gustin, MD;  Location[facility];  Service: Urology;;   DOPPLER ECHOCARDIOGRAPHY     EAR CYST EXCISION Left 07/23/2012   Procedure: EXCISION LEFT EAR LOBE;  Surgeon: Melissa Montane, MD;  Location: [facility];  Service: ENT;  Laterality: Left;   HOLMIUM LASER APPLICATION N/A 93/90/3009   Procedure: HOLMIUM LASER APPLICATION OF BLADDER CALCULI;  Surgeon: Cleon Gustin, MD;  Location[facility];  Service: Urology;  Laterality: N/A;   IR RADIOLOGIST EVAL & MGMT  11/28/2016       Home Medications    Prior to Admission medications   Medication Sig Start Date End Date Taking? Authorizing  Provider  Polyethyl Glycol-Propyl Glycol (SYSTANE) 0.4-0.3 % GEL ophthalmic gel Place 1 Application into the right eye 2 (two) times daily as needed. 03/20/22  Yes Volney American, PA-C  acetaminophen (TYLENOL) 500 MG tablet Take 250 mg by mouth every 6 (six) hours as needed for moderate pain or headache.    [provider]  albuterol (VENTOLIN HFA) 108 (90 Base) MCG/ACT inhaler Inhale 2 puffs into the lungs every 6 (six) hours as needed for wheezing or shortness of breath. 05/16/21   Volney American, PA-C  aspirin EC 81 MG tablet Take 81 mg by mouth daily.    [provider]  fluticasone (FLONASE) 50 MCG/ACT nasal spray Place 1 spray into both nostrils 2 (two) times  daily. Patient not taking: Reported on 12/05/2021 05/16/21   Volney American, PA-C  nitroGLYCERIN (NITROSTAT) 0.4 MG SL tablet PLACE 1 TAB UNDER TONGUE EVERY 5 MIN IF NEEDED FOR CHEST PAIN. MAY USE 3 TIMES.NORELIEF CALL 911. 07/27/20   Arnoldo Lenis, MD    Family History History reviewed. No pertinent family history.  Social History Social History   Tobacco Use   Smoking status: Former    Packs/day: 1.50    Years: 45.00    Total pack years: 67.50    Types: Cigarettes    Quit date: 12/11/2009    Years since quitting: 12.2   Smokeless tobacco: Never  Vaping Use   Vaping Use: Never used  Substance Use Topics   Alcohol use: No   Drug use: No     Allergies   Ciprofloxacin, Crestor [rosuvastatin], Penicillins, Tramadol, Lisinopril, and Macrodantin [nitrofurantoin]   Review of Systems Review of Systems Per HPI  Physical Exam Triage Vital Signs ED Triage Vitals  Enc Vitals Group     BP 03/20/22 1258 136/76     Pulse Rate 03/20/22 1258 63     Resp 03/20/22 1258 20     Temp 03/20/22 1258 (!) 97.1 F (36.2 C)     Temp Source 03/20/22 1258 Oral     SpO2 03/20/22 1258 (!) 85 %     Weight --      Height --      Head Circumference --      Peak Flow --      Pain Score 03/20/22 1305 0     Pain Loc --      Pain Edu? --      Excl. in [place]? --    No data found.  Updated Vital Signs BP 136/76 (BP Location: Right Arm)   Pulse 63   Temp (!) 97.1 F (36.2 C) (Oral)   Resp 20   SpO2 (!) 85%   Visual Acuity Right Eye Distance:   Left Eye Distance:   Bilateral Distance:    Right Eye Near:   Left Eye Near:    Bilateral Near:     Physical Exam Vitals and nursing note reviewed.  Constitutional:      Appearance: Normal appearance.  HENT:     Head: Atraumatic.     Mouth/Throat:     Mouth: Mucous membranes are moist.  Eyes:     General:        Right eye: No discharge.        Left eye: No discharge.     Extraocular Movements: Extraocular movements intact.      Pupils: Pupils are equal, round, and reactive to light.     Comments: Diffuse subconjunctival hemorrhage present to the right eye, no  foreign body appreciable, remainder of exam is benign appearing  Cardiovascular:     Rate and Rhythm: Normal rate.  Pulmonary:     Effort: Pulmonary effort is normal.     Breath sounds: Normal breath sounds.  Musculoskeletal:        General: Normal range of motion.     Cervical back: Normal range of motion and neck supple.  Skin:    General: Skin is warm and dry.  Neurological:     General: No focal deficit present.     Mental Status: He is oriented to person, place, and time.  Psychiatric:        Mood and Affect: Mood normal.        Thought Content: Thought content normal.        Judgment: Judgment normal.      UC Treatments / Results  Labs (all labs ordered are listed, but only abnormal results are displayed) Labs Reviewed - No data to display  EKG   Radiology No results found.  Procedures Procedures (including critical care time)  Medications Ordered in UC Medications - No data to display  Initial Impression / Assessment and Plan / UC Course  I have reviewed the triage vital signs and the nursing notes.  Pertinent labs & imaging results that were available during my care of the patient were reviewed by me and considered in my medical decision making (see chart for details).     Vitals and exam overall reassuring today and significant for obvious subconjunctival hemorrhage of the right eye.  His visual acuity is significantly worse to the right eye but states this has been for many years now since an injury.  He has not appreciated a significant change in his vision since symptoms started.  Discussed lubricating eye ointment, warm compresses and close ophthalmology follow-up over the next day or so.  ED for worsening symptoms.  No red flag findings today.  Final Clinical Impressions(s) / UC Diagnoses   Final diagnoses:   Subconjunctival hemorrhage of right eye     Discharge Instructions      Follow-up with your ophthalmologist as soon as possible for a recheck.  Go to the emergency department if your symptoms are significantly worsening at any time.    ED Prescriptions     Medication Sig Dispense Auth. Provider   Polyethyl Glycol-Propyl Glycol (SYSTANE) 0.4-0.3 % GEL ophthalmic gel Place 1 Application into the right eye 2 (two) times daily as needed. 10 mL Volney American, [place]      PDMP not reviewed this encounter.   Volney American, [place] 03/20/22 1332

## 2022-03-20 NOTE — Discharge Instructions (Addendum)
Follow-up with your ophthalmologist as soon as possible for a recheck.  Go to the emergency department if your symptoms are significantly worsening at any time.

## 2022-03-29 ENCOUNTER — Institutional Professional Consult (permissible substitution): Payer: Medicare HMO | Admitting: Pulmonary Disease

## 2022-06-08 IMAGING — US US RENAL
1 series · 14 of 25 positions shown · non-contrast
Comparison: CT 04/05/2019

CLINICAL DATA: Right renal mass

EXAM:
RENAL / URINARY TRACT ULTRASOUND COMPLETE

[Series 1: us renal · 14 of 84 slices shown]
[im 1/84]
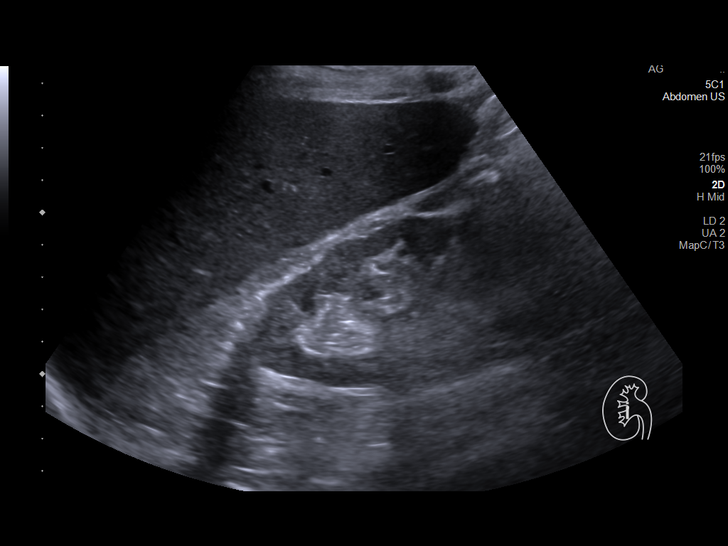
[im 7/84]
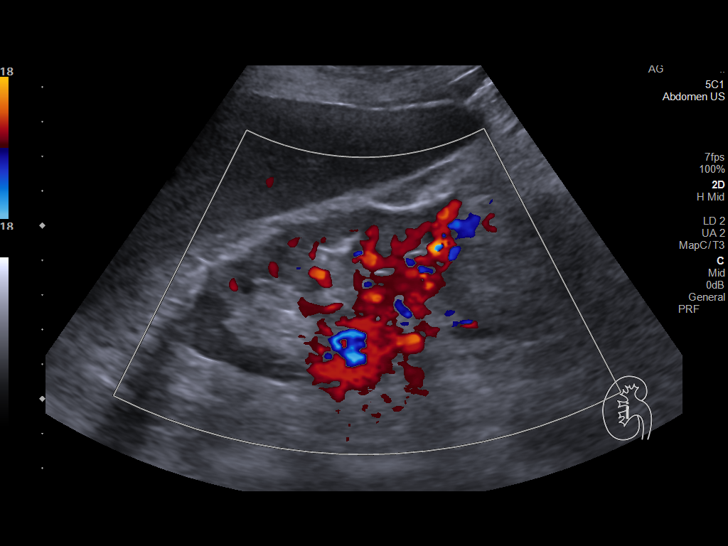
[im 14/84]
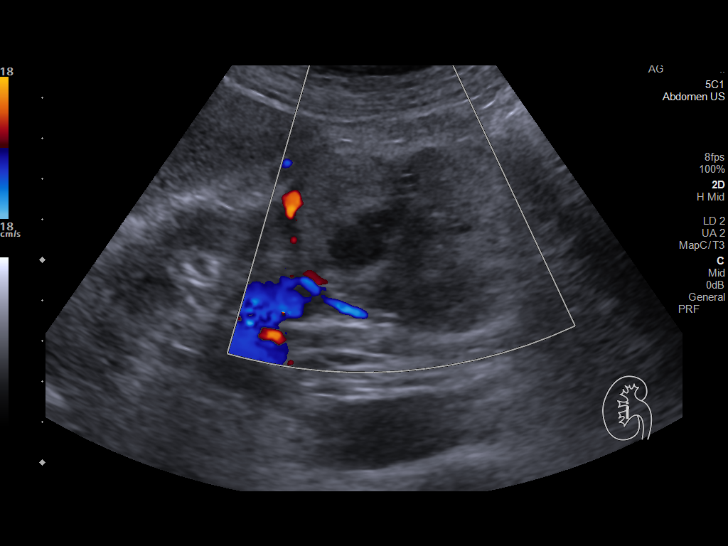
[im 21/84]
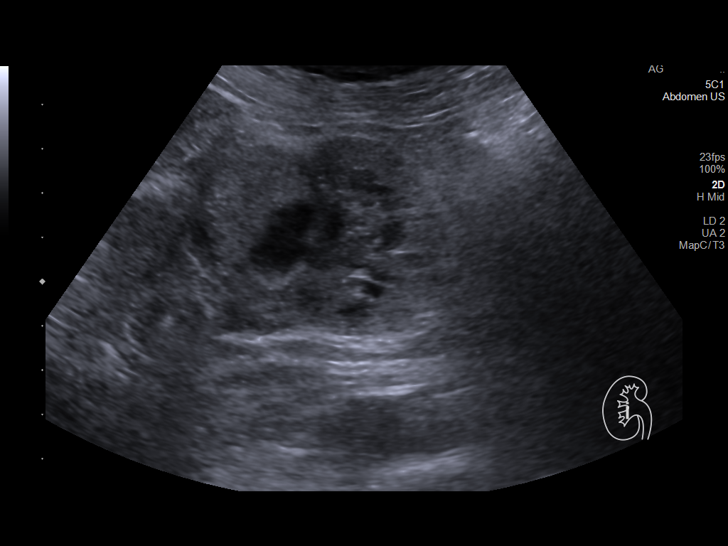
[im 28/84]
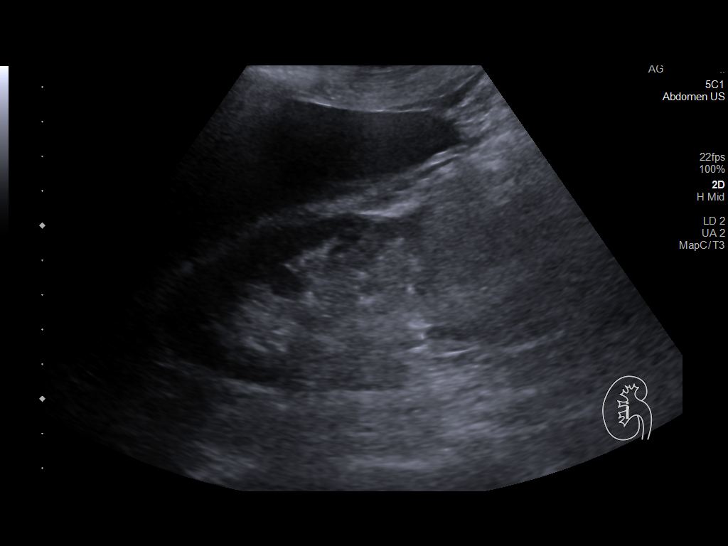
[im 32/84]
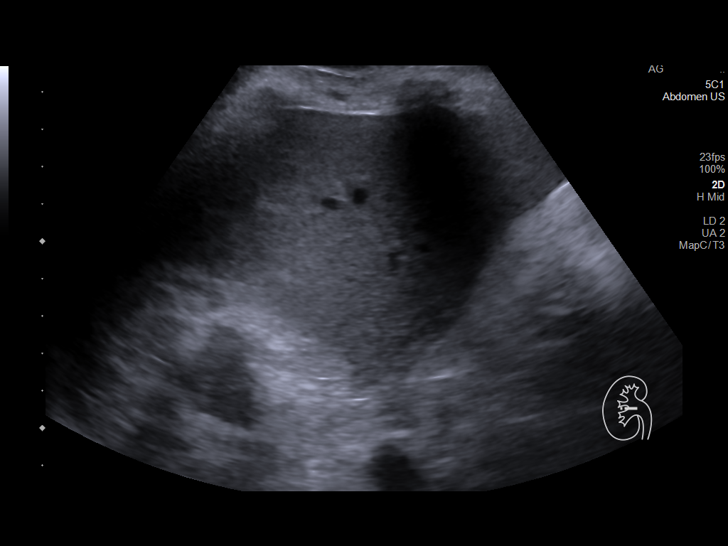
[im 39/84]
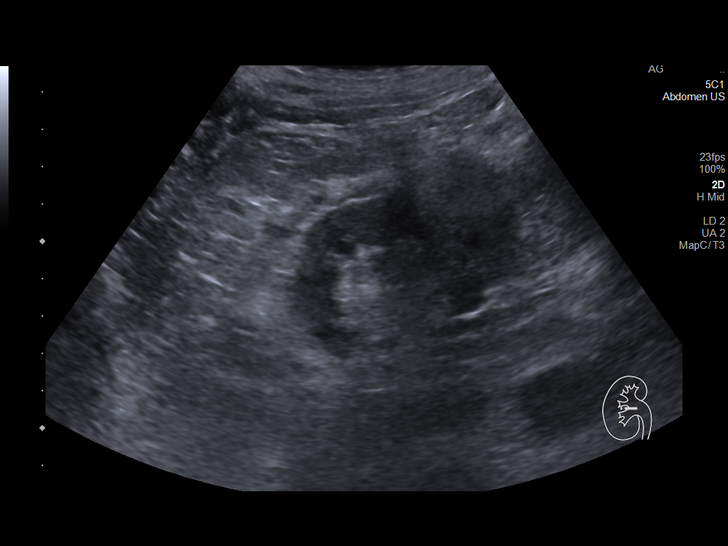
[im 45/84]
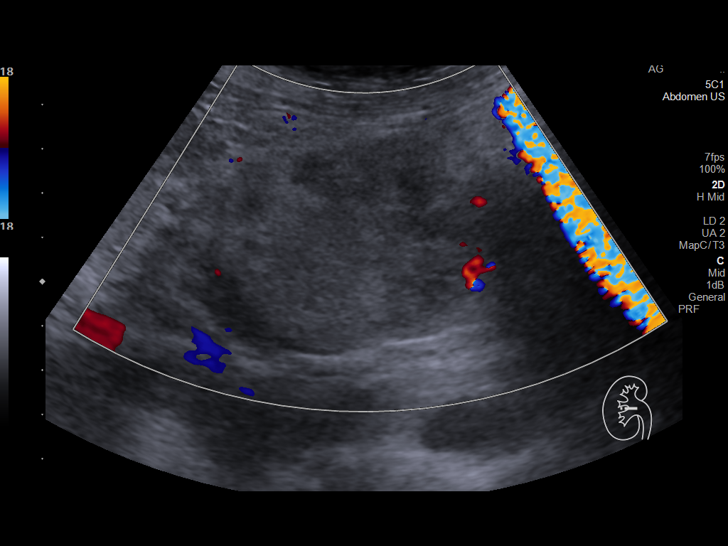
[im 52/84]
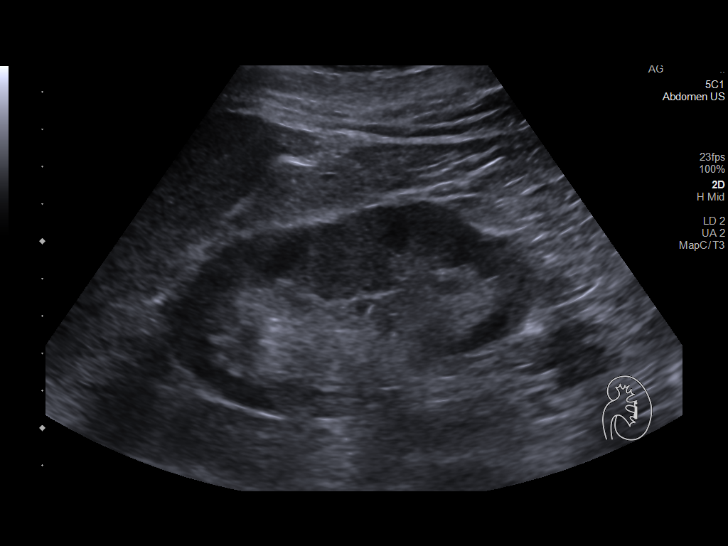
[im 56/84]
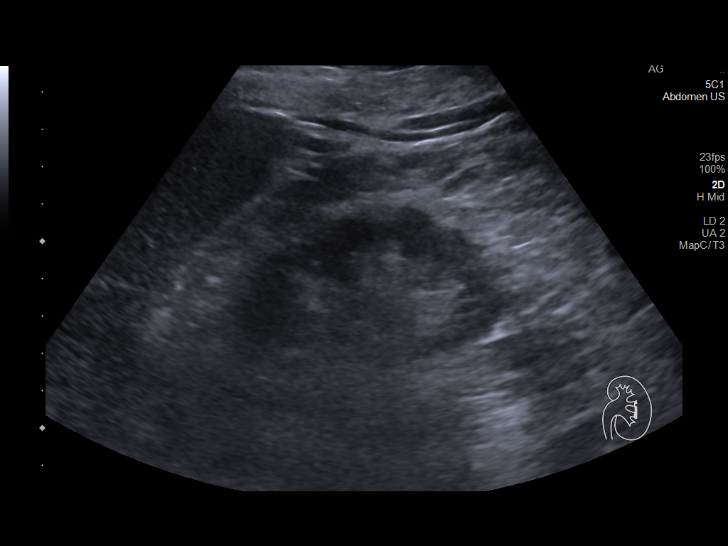
[im 63/84]
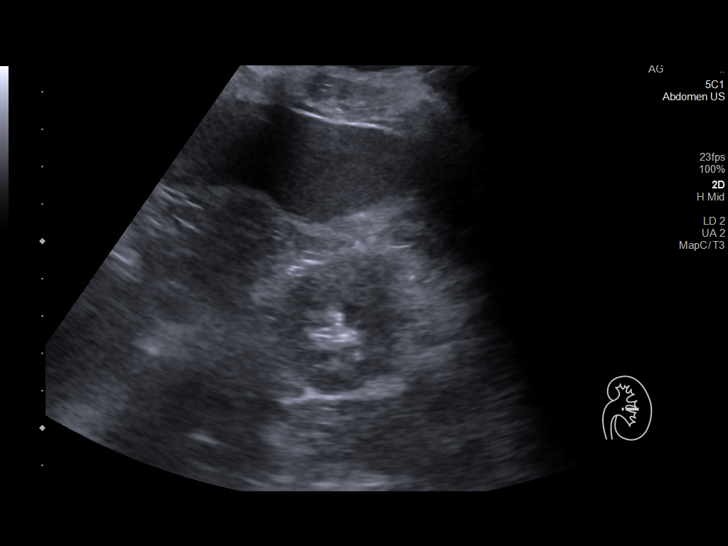
[im 70/84]
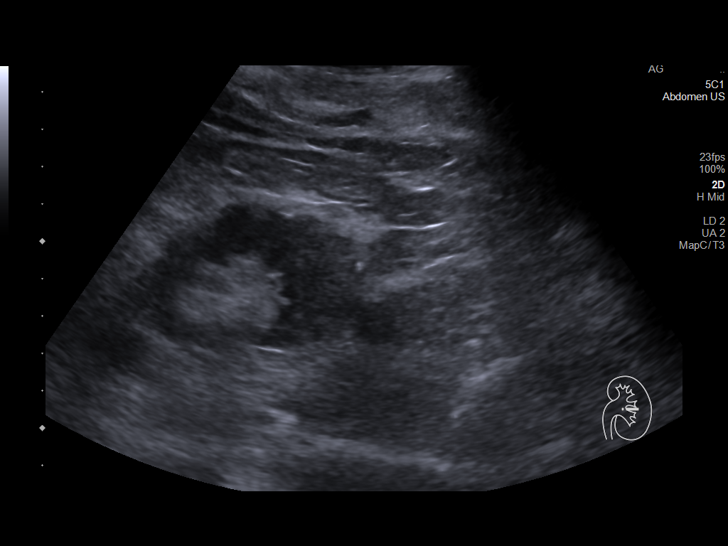
[im 77/84]
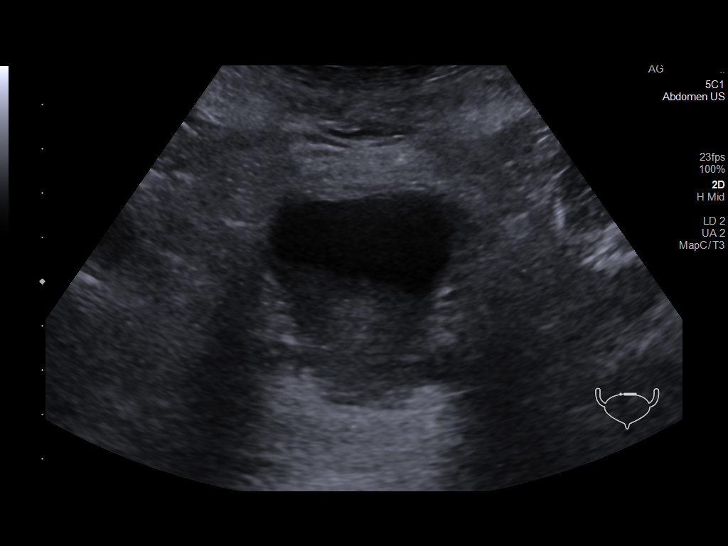
[im 84/84]
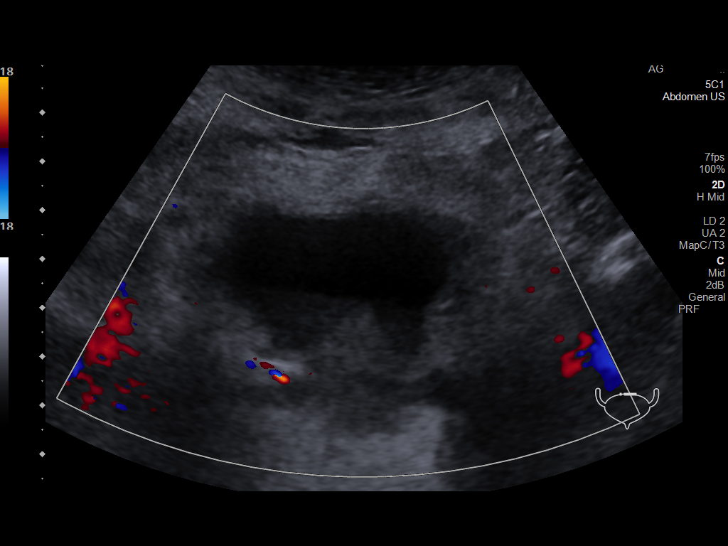

[14 of 25 positions shown; findings below may reference images not displayed]

FINDINGS: Right Kidney:

Renal measurements: 12.3 x 4.8 x 5 cm = volume: 152.8 mL.
Echogenicity within normal limits. No hydronephrosis. Solid mass at
the lower pole of the right kidney measures approximately 6.2 by
x 4.6 cm, previous measurements of 5.4 x 4.1 x 5.3 cm on CT.

Left Kidney:

Renal measurements: 10.2 x 4.7 x 4.8 cm = volume: 120.2 mL.
Echogenicity within normal limits. No mass or hydronephrosis
visualized.

Bladder:

Bladder appears slightly thick-walled but is incompletely distended.
Moderate layering debris within the bladder

Other:

None.
IMPRESSION: 1. Solid mass at the lower pole right kidney measuring up to 6.2 cm
in size, likely enlarged as compared with prior CT allowing for
limitations of inter modality comparison
2. Slightly thick-walled urinary bladder with moderate debris

## 2022-06-11 ENCOUNTER — Telehealth: Payer: Self-pay | Admitting: Internal Medicine

## 2022-06-11 ENCOUNTER — Ambulatory Visit: Payer: Medicare HMO | Admitting: Cardiology

## 2022-06-11 NOTE — Progress Notes (Deleted)
Clinical Summary Angel [name] is a 82 y.o.male seen today for follow up of the following medical problems.      1. CAD - history of prior stent  in 9/ 2011 to RCA  He has refused statin. Some renal dysfunction, not on ARB. Has ACE allergy as well   - no chest pains     2. HTN - higher doses of norvasc caused some leg swelling and dizziness -compliant with meds         3. SOB/Pulmonary HTN - 08/2020 echo LVEF 55-60%, grade I dd, normal RV function, severe pulm HTN PASP 72 - PA Leonides Sake had referred to CHF clinic for pulmonary HTN - 10/2020 VQ no PE - 11/2020 CT chest: early/mild interstitial lung disease,  10/2020 PFTs sevre diffusion defect - he is on 2 to 4 L [place]. Has f/u coming up with Dr Melvyn Novas     - chronic stable SOB. He is on home O2 2-3 L       4. 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 He says he does OK with shots but gets GI upset with PO penicillins Can take small doses with no issues   Tramadol Other (See Comments)    Confusion   Lisinopril Rash and Cough    Patient says it caused bp to increase and caused a rash on  his legs    Macrodantin [Nitrofurantoin] Rash     Current Outpatient Medications  Medication Sig Dispense Refill   acetaminophen (TYLENOL) 500 MG tablet Take 250 mg by mouth every 6 (six) hours as needed for moderate pain or headache.     albuterol (VENTOLIN HFA) 108 (90 Base) MCG/ACT inhaler Inhale 2 puffs into the lungs every 6 (six) hours as needed for wheezing or shortness of breath. 18 g 0   aspirin EC 81 MG tablet Take 81 mg by mouth daily.     fluticasone (FLONASE) 50 MCG/ACT nasal spray Place 1 spray into both nostrils 2 (two) times daily. (Patient not taking: Reported on 12/05/2021) 16 g 2   nitroGLYCERIN (NITROSTAT) 0.4 MG SL tablet PLACE 1 TAB UNDER TONGUE EVERY 5 MIN IF NEEDED FOR CHEST PAIN. MAY USE 3 TIMES.NORELIEF CALL 911. 25 tablet 3   Polyethyl Glycol-Propyl Glycol (SYSTANE) 0.4-0.3 % GEL ophthalmic gel Place 1 Application into the right eye 2 (two) times daily as needed. 10 mL  0   No current facility-administered medications for this visit.     Past Surgical History:  Procedure Laterality Date   COLONOSCOPY     COLONOSCOPY     CORONARY ANGIOPLASTY  2010   Dr Johnsie Cancel   CYSTOSCOPY WITH LITHOLAPAXY N/A 02/24/2017   Procedure: CYSTOSCOPY WITH LITHOLAPAXY;  Surgeon: Cleon Gustin, MD;  Location: [facility];  Service: Urology;  Laterality: N/A;   CYSTOSCOPY WITH URETHRAL DILATATION  02/24/2017   Procedure: CYSTOSCOPY WITH URETHRAL DILATATION;  Surgeon: Cleon Gustin, MD;  Location: [facility];  Service: Urology;;   DOPPLER ECHOCARDIOGRAPHY     EAR CYST EXCISION Left 07/23/2012   Procedure: EXCISION LEFT EAR LOBE;  Surgeon: Melissa Montane, MD;  Location: [facility];  Service: ENT;  Laterality: Left;   HOLMIUM LASER APPLICATION N/A [id number]   Procedure: HOLMIUM LASER APPLICATION OF BLADDER CALCULI;  Surgeon: Cleon Gustin, MD;  Location: [facility];  Service: Urology;  Laterality: N/A;   IR RADIOLOGIST EVAL & MGMT  11/28/2016     Allergies  Allergen Reactions    Ciprofloxacin Other (See Comments)   Crestor [Rosuvastatin] Other (See Comments)    Unknown    Penicillins Other (See Comments)    Per patient causes gi upset with high doses. He says he does OK with shots but gets GI upset with PO penicillins Can take small doses with no issues   Tramadol Other (See Comments)    Confusion   Lisinopril Rash and Cough    Patient says it caused bp to increase and caused a rash on his legs    Macrodantin [Nitrofurantoin] Rash      No family history on file.   Social History Angel [name] reports that he quit smoking about 12 years ago. His smoking use included cigarettes. He has a 67.50 pack-year smoking history. He has never used smokeless tobacco. Angel [name] reports no history of alcohol use.   Review of Systems CONSTITUTIONAL: No weight loss, fever, chills, weakness or fatigue.  HEENT: Eyes: No visual loss, blurred vision, double vision or yellow sclerae.No hearing loss, sneezing, congestion, runny nose or sore throat.  SKIN: No rash or itching.  CARDIOVASCULAR:  RESPIRATORY: No shortness of breath, cough or sputum.  GASTROINTESTINAL: No anorexia, nausea, vomiting or diarrhea. No abdominal pain or blood.  GENITOURINARY: No burning on urination, no polyuria NEUROLOGICAL: No headache, dizziness, syncope, paralysis, ataxia, numbness or tingling in the extremities. No change in bowel or bladder control.  MUSCULOSKELETAL: No muscle, back pain, joint pain or stiffness.  LYMPHATICS: No enlarged nodes. No history of splenectomy.  PSYCHIATRIC: No history of depression or anxiety.  ENDOCRINOLOGIC: No reports of sweating, cold or heat intolerance. No polyuria or polydipsia.  Marland Kitchen   Physical Examination There were no vitals filed for this visit. There were no vitals filed for this visit.  Gen: resting comfortably, no acute distress HEENT: no scleral icterus, pupils equal round and reactive, no palptable cervical adenopathy,  CV Resp: Clear to auscultation  bilaterally GI: abdomen is soft, non-tender, non-distended, normal bowel sounds, no hepatosplenomegaly MSK: extremities are warm, no edema.  Skin: warm, no rash Neuro:  no focal deficits Psych: appropriate affect   Diagnostic Studies  01/2017 echo Study Conclusions   - Left ventricle: The cavity size was normal. Wall thickness was   increased in a pattern of mild LVH. Systolic function was normal.   The estimated ejection fraction was in the range of 60% to 65%.   Wall motion was normal; there  were no regional wall motion   abnormalities. Doppler parameters are consistent with abnormal   left ventricular relaxation (grade 1 diastolic dysfunction). - Aortic valve: There was mild regurgitation. - Mitral valve: There was mild regurgitation. - Tricuspid valve: There was moderate regurgitation. - Pulmonary arteries: PA peak pressure: 38 mm Hg (S).       Assessment and Plan  1. CAD - no recent symptoms, continue current meds     2. 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 - primary management would be treatment for his chronic lung disease and hypoxia, no clear role for pulmonary vasodilators.       Arnoldo Lenis, M.D., F.A.C.C.

## 2022-06-11 NOTE — Telephone Encounter (Signed)
PT has two O2 suppliers. [employer] as well as [employer]. Medicare won't pay for both. PT wonders what Dr. Viona Gilmore would recommend he keep. Pls call PT @ [phone]

## 2022-06-12 NOTE — Telephone Encounter (Signed)
error 

## 2022-06-13 ENCOUNTER — Encounter: Payer: Self-pay | Admitting: Cardiology

## 2022-06-13 ENCOUNTER — Ambulatory Visit: Payer: Medicare HMO | Attending: Cardiology | Admitting: Cardiology

## 2022-06-13 VITALS — BP 128/76 | HR 67 | Ht 70.0 in | Wt 138.0 lb

## 2022-06-13 DIAGNOSIS — I272 Pulmonary hypertension, unspecified: Secondary | ICD-10-CM | POA: Diagnosis not present

## 2022-06-13 DIAGNOSIS — I2510 Atherosclerotic heart disease of native coronary artery without angina pectoris: Secondary | ICD-10-CM

## 2022-06-13 DIAGNOSIS — I10 Essential (primary) hypertension: Secondary | ICD-10-CM | POA: Diagnosis not present

## 2022-06-13 NOTE — Telephone Encounter (Signed)
Called and spoke to patient and he states that he has 2 oxygen supplies companies and that Medicare is not going to pay for both and he is wanting Dr Morrison Old input on which one to keep.  [employer]: POC   [employer]: 2 tanks and a concentrator that he uses at night.   Patient states that sometimes the POC hurts his should but he likes it when he goes out. But he uses the concentrator for night time use. He is asking Dr Melvyn Novas if he has any preference on who he should stay with  Please advise

## 2022-06-13 NOTE — Telephone Encounter (Signed)
Unless [employer] can supply him with his home 02/ night time concentrator then will have to change to [employer] and let us send him there for a best fit eval for portable 02 requirements

## 2022-06-13 NOTE — Telephone Encounter (Signed)
Called and spoke with patient. He verbalized understanding. He stated that he would think about his decision before calling [employer] or [employer] to pick up their equipment. He is aware to call us if he needs anything.

## 2022-06-13 NOTE — Progress Notes (Signed)
Clinical Summary [name] is a 82 y.o.male seen today for follow up of the following medical problems.      1. CAD - history of prior stent  in 9/ 2011 to RCA  He has refused statin. Some renal dysfunction, not on ARB. Has ACE allergy as well   - no recent chest pains - compliant with meds - 09/2021 TC 130 TG 92 HDL 39 LDL 73 without medications, has refused statins.    2. HTN - higher doses of norvasc caused some leg swelling and dizziness -not requiring meds         3. SOB/Pulmonary HTN - 08/2020 echo LVEF 55-60%, grade I dd, normal RV function, severe pulm HTN PASP 72 - PA Angel [name] had referred to CHF clinic for pulmonary HTN - 10/2020 VQ no PE - 11/2020 CT chest: early/mild interstitial lung disease,  10/2020 PFTs sevre diffusion defect - he is on 2 to 4 L [place]. Has f/u coming up with Dr Melvyn Novas      - on home O2 3-4 L continous - chronic stable SOB/DOE       4. Possible ILD - ongoing workup by pulmonary - PFTs with severe diffusion defect, recent abnormal high res CT     5. 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 He says he does OK with shots but gets GI upset with PO penicillins Can take small doses with  no issues   Tramadol Other (See Comments)    Confusion   Lisinopril Rash and Cough    Patient says it caused bp to increase and caused a rash on his legs    Macrodantin [Nitrofurantoin] Rash     Current Outpatient Medications  Medication Sig Dispense Refill   cetirizine (ZYRTEC) 10 MG tablet Take 10 mg by mouth daily.     acetaminophen (TYLENOL) 500 MG tablet Take 250 mg by mouth every 6 (six) hours as needed for moderate pain or headache.     albuterol (VENTOLIN HFA) 108 (90 Base) MCG/ACT inhaler Inhale 2 puffs into the lungs every 6 (six) hours as needed for wheezing or shortness of breath. 18 g 0   aspirin EC 81 MG tablet Take 81 mg by mouth daily.     fluticasone (FLONASE) 50 MCG/ACT nasal spray Place 1 spray into both nostrils 2 (two) times daily. (Patient not taking: Reported on 12/05/2021) 16 g 2   nitroGLYCERIN (NITROSTAT) 0.4 MG SL tablet PLACE 1 TAB UNDER  TONGUE EVERY 5 MIN IF NEEDED FOR CHEST PAIN. MAY USE 3 TIMES.NORELIEF CALL 911. 25 tablet 3   Polyethyl Glycol-Propyl Glycol (SYSTANE) 0.4-0.3 % GEL ophthalmic gel Place 1 Application into the right eye 2 (two) times daily as needed. 10 mL 0   No current facility-administered medications for this visit.     Past Surgical History:  Procedure Laterality Date   COLONOSCOPY     COLONOSCOPY     CORONARY ANGIOPLASTY  2010   Dr Johnsie Cancel   CYSTOSCOPY WITH LITHOLAPAXY N/A 02/24/2017   Procedure: CYSTOSCOPY WITH LITHOLAPAXY;  Surgeon: Cleon Gustin, MD;  Location: [facility];  Service: Urology;  Laterality: N/A;   CYSTOSCOPY WITH URETHRAL DILATATION  02/24/2017   Procedure: CYSTOSCOPY WITH URETHRAL DILATATION;  Surgeon: Cleon Gustin, MD;  Location: [facility];  Service: Urology;;   DOPPLER ECHOCARDIOGRAPHY     EAR CYST EXCISION Left 07/23/2012   Procedure: EXCISION LEFT EAR LOBE;  Surgeon: Melissa Montane, MD;  Location: [facility];  Service: ENT;  Laterality: Left;   HOLMIUM LASER APPLICATION N/A 40/12/6759   Procedure: HOLMIUM LASER  APPLICATION OF BLADDER CALCULI;  Surgeon: Cleon Gustin, MD;  Location: [facility];  Service: Urology;  Laterality: N/A;   IR RADIOLOGIST EVAL & MGMT  11/28/2016     Allergies  Allergen Reactions   Ciprofloxacin Other (See Comments)   Crestor [Rosuvastatin] Other (See Comments)    Unknown    Penicillins Other (See Comments)    Per patient causes gi upset with high doses. He says he does OK with shots but gets GI upset with PO penicillins Can take small doses with no issues   Tramadol Other (See Comments)    Confusion   Lisinopril Rash and Cough    Patient says it caused bp to increase and caused a rash on his legs    Macrodantin [Nitrofurantoin] Rash      No family history on file.   Social History [name] reports that he quit smoking about 12 years ago. His smoking use included cigarettes. He has a 67.50 pack-year smoking history. He has never used smokeless tobacco. [name] reports no history of alcohol use.   Review of Systems CONSTITUTIONAL: No weight loss, fever, chills, weakness or fatigue.  HEENT: Eyes: No visual loss, blurred vision, double vision or yellow sclerae.No hearing loss, sneezing, congestion, runny nose or sore throat.  SKIN: No rash or itching.  CARDIOVASCULAR: per hpi RESPIRATORY: per hpi GASTROINTESTINAL: No anorexia, nausea, vomiting or diarrhea. No abdominal pain or blood.  GENITOURINARY: No burning on urination, no polyuria NEUROLOGICAL: No headache, dizziness, syncope, paralysis, ataxia, numbness or tingling in the extremities. No change in bowel or bladder control.  MUSCULOSKELETAL: No muscle, back pain, joint pain or stiffness.  LYMPHATICS: No enlarged nodes. No history of splenectomy.  PSYCHIATRIC: No history of depression or anxiety.  ENDOCRINOLOGIC: No reports of sweating, cold or heat intolerance. No polyuria or polydipsia.  Marland Kitchen   Physical Examination Today's Vitals   06/13/22 1036  BP: 128/76  Pulse: 67  SpO2: 92%  Weight: 138 lb  (62.6 kg)  Height: '5\' 10"'$  (1.778 m)   Body mass index is 19.8 kg/m.  Gen: resting comfortably, no acute distress HEENT: no scleral icterus, pupils equal round and reactive, no palptable cervical adenopathy,  CV: RRR, no m/rg, no jvd Resp: Clear to auscultation bilaterally GI: abdomen is soft, non-tender, non-distended, normal bowel sounds, no hepatosplenomegaly MSK: extremities are warm, no edema.  Skin: warm, no rash Neuro:  no focal deficits Psych: appropriate affect   Diagnostic Studies  01/2017 echo Study Conclusions   - Left ventricle: The cavity size was normal. Wall thickness was   increased in a pattern of mild LVH. Systolic function was normal.   The estimated ejection fraction was in the range of 60% to 65%.   Wall motion was normal; there were no regional wall motion   abnormalities. Doppler parameters are consistent with abnormal   left ventricular relaxation (grade 1 diastolic dysfunction). - Aortic valve: There was mild regurgitation. - Mitral valve: There was mild regurgitation. - Tricuspid valve: There was moderate regurgitation. - Pulmonary arteries: PA peak pressure: 38 mm Hg (S).     Assessment and Plan   1. CAD -no symptoms, continue current meds     2. HTN -at goal, no longer requiring medications.      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 - primary management would be treatment for his chronic lung disease and hypoxia, no clear role for pulmonary vasodilators.   - continue to monitor at this time, no additional testing indicated.           F/u 6 months   Arnoldo Lenis, M.D.

## 2022-06-13 NOTE — Patient Instructions (Signed)
Medication Instructions:  Continue all current medications.  Labwork: none  Testing/Procedures: none  Follow-Up: 6 months   Any Other Special Instructions Will Be Listed Below (If Applicable).  If you need a refill on your cardiac medications before your next appointment, please call your pharmacy.  
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
## 2022-07-02 ENCOUNTER — Ambulatory Visit (INDEPENDENT_AMBULATORY_CARE_PROVIDER_SITE_OTHER): Payer: Medicare HMO

## 2022-07-02 ENCOUNTER — Ambulatory Visit (INDEPENDENT_AMBULATORY_CARE_PROVIDER_SITE_OTHER): Payer: Medicare HMO | Admitting: Internal Medicine

## 2022-07-02 ENCOUNTER — Encounter: Payer: Self-pay | Admitting: Internal Medicine

## 2022-07-02 VITALS — BP 118/62 | HR 65 | Ht 70.0 in | Wt 137.0 lb

## 2022-07-02 DIAGNOSIS — J9611 Chronic respiratory failure with hypoxia: Secondary | ICD-10-CM

## 2022-07-02 NOTE — Patient Instructions (Signed)
Make sure you check your oxygen saturation  AT  your highest level of activity (not after you stop)   to be sure it stays over 90% and adjust  02 flow upward to maintain this level if needed but remember to turn it back to previous settings when you stop (to conserve your supply).    Please remember to go to the  x-ray department  for your tests - we will call you with the results when they are available     Please schedule a follow up visit in 6  months but call sooner if needed [facility] OFFICE
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
## 2022-07-08 ENCOUNTER — Telehealth: Payer: Self-pay | Admitting: Internal Medicine

## 2022-07-08 NOTE — Telephone Encounter (Signed)
Patient returning phone call for xray results. Patient phone number is [phone].

## 2022-07-09 NOTE — Telephone Encounter (Signed)
ATC X1 lvm for patient to call the office back

## 2022-07-09 NOTE — Telephone Encounter (Signed)
Patient is returning phone call. Patient phone number is [phone].

## 2022-07-09 NOTE — Telephone Encounter (Signed)
Pt has been made aware of cxr results. Nothing further needed.

## 2022-08-26 ENCOUNTER — Ambulatory Visit (HOSPITAL_COMMUNITY)
Admission: RE | Admit: 2022-08-26 | Discharge: 2022-08-26 | Disposition: A | Discharge status: Home / Self Care | Payer: Medicare HMO | Source: Ambulatory Visit | Attending: Urology | Admitting: Urology

## 2022-08-26 DIAGNOSIS — N2889 Other specified disorders of kidney and ureter: Secondary | ICD-10-CM | POA: Insufficient documentation

## 2022-08-26 DIAGNOSIS — N210 Calculus in bladder: Secondary | ICD-10-CM | POA: Diagnosis present

## 2022-09-04 ENCOUNTER — Encounter: Payer: Self-pay | Admitting: Urology

## 2022-09-04 ENCOUNTER — Ambulatory Visit (INDEPENDENT_AMBULATORY_CARE_PROVIDER_SITE_OTHER): Payer: Medicare HMO | Admitting: Urology

## 2022-09-04 VITALS — BP 127/77 | HR 79

## 2022-09-04 DIAGNOSIS — N210 Calculus in bladder: Secondary | ICD-10-CM

## 2022-09-04 DIAGNOSIS — N2889 Other specified disorders of kidney and ureter: Secondary | ICD-10-CM

## 2022-09-04 LAB — URINALYSIS, ROUTINE W REFLEX MICROSCOPIC
Bilirubin, UA: NEGATIVE
Glucose, UA: NEGATIVE
Ketones, UA: NEGATIVE
Leukocytes,UA: NEGATIVE
Nitrite, UA: NEGATIVE
Specific Gravity, UA: 1.02 (ref 1.005–1.030)
Urobilinogen, Ur: 4 mg/dL — ABNORMAL HIGH (ref 0.2–1.0)
pH, UA: 6 (ref 5.0–7.5)

## 2022-09-04 LAB — MICROSCOPIC EXAMINATION: Bacteria, UA: NONE SEEN

## 2022-09-04 NOTE — Progress Notes (Signed)
09/04/2022 1:36 PM   Angel [name] 07-02-1940 [id number]  Referring provider: Tracey Harries, MD [address] [place],  [place] [address]  Followup renal mass and bladder calculi   HPI: Angel [name] is a 81yo here for followup for bladder calculi and right renal mass. He passed 2 small bladder calculi since last visit. He has stable mild LUTS. No dysuria or hematuria. No straining to urinate. Urine stream fair. Renal US shows right renal mass has increased in size to 8.7cm from 8.4cm.    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    Surgical History: Past Surgical History:  Procedure Laterality Date   COLONOSCOPY     COLONOSCOPY     CORONARY ANGIOPLASTY  2010   Dr Eden Emms   CYSTOSCOPY WITH LITHOLAPAXY N/A 02/24/2017   Procedure: CYSTOSCOPY WITH LITHOLAPAXY;  Surgeon: Malen Gauze, MD;  Location: [facility];  Service: Urology;  Laterality: N/A;   CYSTOSCOPY WITH URETHRAL DILATATION  02/24/2017   Procedure: CYSTOSCOPY WITH URETHRAL DILATATION;  Surgeon: Malen Gauze, MD;  Location: [facility];  Service: Urology;;   DOPPLER ECHOCARDIOGRAPHY     EAR CYST EXCISION Left 07/23/2012   Procedure: EXCISION LEFT EAR LOBE;  Surgeon: Suzanna Obey, MD;  Location: [facility] OR;  Service: ENT;  Laterality: Left;   HOLMIUM LASER APPLICATION N/A 02/24/2017   Procedure: HOLMIUM LASER APPLICATION OF BLADDER CALCULI;  Surgeon: Malen Gauze, MD;  Location: [facility];  Service: Urology;  Laterality: N/A;   IR RADIOLOGIST EVAL & MGMT  11/28/2016    Home Medications:  Allergies as of 09/04/2022       Reactions   Ciprofloxacin Other (See Comments)   Crestor [rosuvastatin] Other (See Comments)   Unknown    Penicillins Other (See Comments)   Per patient causes gi upset with high doses. He says he does OK with shots but gets GI upset with PO penicillins Can take small doses with no issues   Tramadol Other (See Comments)   Confusion   Lisinopril Rash, Cough   Patient says it caused bp to increase and caused a rash on his legs    Macrodantin [nitrofurantoin] Rash        Medication List        Accurate as of September 04, 2022  1:36 PM. If you have any questions, ask your nurse or doctor.          albuterol 108 (90 Base) MCG/ACT inhaler Commonly known as: VENTOLIN HFA Inhale 2 puffs into the lungs every 6 (six) hours as needed for wheezing or shortness of breath.   aspirin EC 81 MG tablet Take 81 mg by mouth daily.   nitroGLYCERIN 0.4 MG SL tablet Commonly known as: NITROSTAT PLACE 1  TAB UNDER TONGUE EVERY 5 MIN IF NEEDED FOR CHEST PAIN. MAY USE 3 TIMES.NORELIEF CALL 911.        Allergies:  Allergies  Allergen Reactions   Ciprofloxacin Other (See Comments)   Crestor [Rosuvastatin] Other (See Comments)    Unknown    Penicillins Other (See Comments)    Per patient causes gi upset with high doses. He says he does OK with shots but gets GI upset with PO penicillins Can take small doses with no issues   Tramadol Other (See Comments)    Confusion   Lisinopril Rash and Cough    Patient says it caused bp to increase and caused a rash on his legs    Macrodantin [Nitrofurantoin] Rash    Family History: No family history on file.  Social History:  reports that he quit smoking about 12 years ago. His smoking use included cigarettes. He has a 67.50 pack-year smoking history. He has never used smokeless tobacco. He reports that he does  not drink alcohol and does not use drugs.  ROS: All other review of systems were reviewed and are negative except what is noted above in HPI  Physical Exam: BP 127/77   Pulse 79   Constitutional:  Alert and oriented, No acute distress. HEENT: [place] AT, moist mucus membranes.  Trachea midline, no masses. Cardiovascular: No clubbing, cyanosis, or edema. Respiratory: Normal respiratory effort, no increased work of breathing. GI: Abdomen is soft, nontender, nondistended, no abdominal masses GU: No CVA tenderness.  Lymph: No cervical or inguinal lymphadenopathy. Skin: No rashes, bruises or suspicious lesions. Neurologic: Grossly intact, no focal deficits, moving all 4 extremities. Psychiatric: Normal mood and affect.  Laboratory Data: Lab Results  Component Value Date   WBC 6.1 07/05/2021   HGB 16.4 07/05/2021   HCT 48.1 07/05/2021   MCV 91 07/05/2021   PLT 180 07/05/2021    Lab Results  Component Value Date   CREATININE 1.34 (H) 07/05/2021    No results found for: "PSA"  No results found for: "TESTOSTERONE"  No results found for: "HGBA1C"  Urinalysis    Component Value Date/Time   COLORURINE YELLOW 04/04/2020 1925   APPEARANCEUR Cloudy (A) 07/09/2021 1042   LABSPEC 1.016 04/04/2020 1925   PHURINE 6.0 04/04/2020 1925   GLUCOSEU Negative 07/09/2021 1042   HGBUR MODERATE (A) 04/04/2020 1925   BILIRUBINUR Negative 07/09/2021 1042   KETONESUR trace (5) (A) 12/03/2020 1104   KETONESUR NEGATIVE 04/04/2020 1925   PROTEINUR 1+ (A) 07/09/2021 1042   PROTEINUR 100 (A) 04/04/2020 1925   UROBILINOGEN 0.2 12/03/2020 1104   UROBILINOGEN 0.2 07/25/2012 0446   NITRITE Negative 07/09/2021 1042   NITRITE NEGATIVE 04/04/2020 1925   LEUKOCYTESUR 3+ (A) 07/09/2021 1042   LEUKOCYTESUR LARGE (A) 04/04/2020 1925    Lab Results  Component Value Date   LABMICR See below: 07/09/2021   WBCUA >30 (A) 07/09/2021   LABEPIT 0-10 07/09/2021   BACTERIA Many (A) 07/09/2021    Pertinent  Imaging: Renal US4/15/2024: Images reviewed and discussed with the patient No results found for this or any previous visit.  No results found for this or any previous visit.  No results found for this or any previous visit.  No results found for this or any previous visit.  Results for orders placed during the hospital encounter of 08/26/22  Ultrasound renal complete  Narrative CLINICAL DATA:  Follow-up renal mass  EXAM: RENAL / URINARY TRACT ULTRASOUND COMPLETE  COMPARISON:  February 15, 2022  FINDINGS: Right Kidney:  Renal measurements: 10.4 x 4.8 x 5.2 cm = volume: 135 mL. The known lower pole solid renal mass measures 8.7 x 7.1 x 7.4 cm today versus 8.4 x 5.7 x 8 cm February 15, 2022 and 7.7 x 6.7 x 7.3 cm July 02, 2021.  Left Kidney:  Renal measurements: 10.1 x 4.3 x 4.4 cm = volume: 98.4 mL. Echogenicity within normal limits. No mass or hydronephrosis visualized.  Bladder:  Appears normal for degree of bladder distention.  Other:  The prostate volume is 90 cc  IMPRESSION: 1. The known solid mass in the lower pole of the right kidney measures 8.7 x 7.1 x 7.4 cm today versus 8.4 x 5.7 x 8 cm February 15, 2022 and 7.7 x 6.7 x 7.3 cm July 02, 2021. Overall, there appears to be continued interval growth. 2. The prostate volume is 90 cc.   Electronically Signed By: Gerome Sam III M.D. On: 08/26/2022 17:21  No valid procedures specified. No results found for this or any previous visit.  No results found for this or any previous visit.   Assessment & Plan:    1. Calculus of bladder -patient defers therapy at this time - Urinalysis, Routine w reflex microscopic  2. Renal mass Continue surveillance since patient is not a surgical candidate. Followup 6 months with renal US - Urinalysis, Routine w reflex microscopic   No follow-ups on file.  Wilkie Aye, MD  [facility] [place]

## 2022-09-04 NOTE — Patient Instructions (Signed)

## 2022-10-08 NOTE — Progress Notes (Signed)
Pt not seen.

## 2022-12-06 ENCOUNTER — Emergency Department (HOSPITAL_COMMUNITY): Payer: 59

## 2022-12-06 ENCOUNTER — Emergency Department (HOSPITAL_COMMUNITY)
Admission: EM | Admit: 2022-12-06 | Discharge: 2022-12-06 | Disposition: A | Discharge status: Home / Self Care | Payer: 59 | Source: Home / Self Care | Attending: Emergency Medicine | Admitting: Emergency Medicine

## 2022-12-06 ENCOUNTER — Encounter (HOSPITAL_COMMUNITY): Payer: Self-pay

## 2022-12-06 DIAGNOSIS — S6992XA Unspecified injury of left wrist, hand and finger(s), initial encounter: Secondary | ICD-10-CM | POA: Diagnosis present

## 2022-12-06 DIAGNOSIS — Z23 Encounter for immunization: Secondary | ICD-10-CM | POA: Diagnosis not present

## 2022-12-06 DIAGNOSIS — S61412A Laceration without foreign body of left hand, initial encounter: Secondary | ICD-10-CM | POA: Diagnosis not present

## 2022-12-06 DIAGNOSIS — Z7982 Long term (current) use of aspirin: Secondary | ICD-10-CM | POA: Insufficient documentation

## 2022-12-06 DIAGNOSIS — W500XXA Accidental hit or strike by another person, initial encounter: Secondary | ICD-10-CM | POA: Diagnosis not present

## 2022-12-06 DIAGNOSIS — S66922A Laceration of unspecified muscle, fascia and tendon at wrist and hand level, left hand, initial encounter: Secondary | ICD-10-CM

## 2022-12-06 MED ORDER — DOXYCYCLINE HYCLATE 100 MG PO CAPS: 100.0000 mg | ORAL_CAPSULE | Freq: Two times a day (BID) | ORAL | 0 refills | Status: DC

## 2022-12-06 MED ORDER — LIDOCAINE-EPINEPHRINE 2 %-1:200000 IJ SOLN
20.0000 mL | Freq: Once | INTRAMUSCULAR | Status: AC
Start: 2022-12-06 — End: 2022-12-06
  Administered 2022-12-06: 20 mL via INTRADERMAL
  Filled 2022-12-06: qty 20

## 2022-12-06 MED ORDER — BACITRACIN ZINC 500 UNIT/GM EX OINT
TOPICAL_OINTMENT | CUTANEOUS | Status: AC
  Administered 2022-12-06: 1
  Filled 2022-12-06: qty 0.9

## 2022-12-06 MED ORDER — TETANUS-DIPHTH-ACELL PERTUSSIS 5-2.5-18.5 LF-MCG/0.5 IM SUSY
0.5000 mL | PREFILLED_SYRINGE | Freq: Once | INTRAMUSCULAR | Status: AC
  Administered 2022-12-06: 0.5 mL via INTRAMUSCULAR
  Filled 2022-12-06: qty 0.5

## 2022-12-06 MED ORDER — ACETAMINOPHEN 325 MG PO TABS
650.0000 mg | ORAL_TABLET | Freq: Once | ORAL | Status: DC
  Filled 2022-12-06: qty 2

## 2022-12-06 NOTE — Discharge Instructions (Signed)
Your hand has a very deep laceration, this is involving the muscles which cannot be repaired however it should heal but it may take quite some time.  I would like for the stitches to stay in for at least 14 days, you will need to follow-up with your family doctor to have them removed, please keep a bulky dressing on this at all times, change it twice a day, if you need help changing you can see your doctor the urgent care or return to the ER.  If you are soaking through bandages with severe bleeding return to the ER.  Please do not take your aspirin for the next 3 days, then you may resume

## 2022-12-06 NOTE — ED Provider Notes (Signed)
[place] EMERGENCY DEPARTMENT AT [facility] Provider Note   CSN: [id number] Arrival date & time: 12/06/22  1920     History  Chief Complaint  Patient presents with   Hand Injury    Pt stated that he was using a trailer wench and his hand slipped and got trapped in the trailer. Ems stated that the injury is a large skin tear between thumb and pointer finger on his left hand. Pt stated that his pain is about a 3.    Angel [name] is a 82 y.o. male.   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  He is unsure of his last tetanus status  Home Medications Prior to Admission medications   Medication Sig Start Date End Date Taking? Authorizing Provider  doxycycline (VIBRAMYCIN) 100 MG capsule Take 1 capsule (100 mg total) by mouth 2 (two) times daily. 12/06/22  Yes Eber Hong, MD  albuterol (VENTOLIN HFA) 108 (90 Base) MCG/ACT inhaler Inhale 2 puffs into the lungs every 6 (six) hours as needed for wheezing or shortness of breath. 05/16/21   Particia Nearing, PA-C  aspirin EC 81 MG tablet Take 81 mg by mouth daily.    [provider]  nitroGLYCERIN (NITROSTAT) 0.4 MG SL tablet PLACE 1 TAB UNDER TONGUE EVERY 5 MIN IF NEEDED FOR CHEST PAIN. MAY USE 3 TIMES.NORELIEF CALL 911. 07/27/20   Antoine Poche, MD      Allergies    Ciprofloxacin, Crestor [rosuvastatin], Penicillins, Tramadol, Lisinopril, and Macrodantin [nitrofurantoin]    Review of Systems   Review of Systems  All other systems reviewed and are negative.   Physical Exam Updated Vital Signs BP 134/81 (BP Location: Left Arm)   Pulse 62   Temp 98.6 F (37 C) (Oral)   Resp 18    SpO2 95%  Physical Exam Vitals and nursing note reviewed.  Constitutional:      Appearance: He is well-developed. He is not diaphoretic.  HENT:     Head: Normocephalic and atraumatic.  Eyes:     General:        Right eye: No discharge.        Left eye: No discharge.     Conjunctiva/sclera: Conjunctivae normal.  Pulmonary:     Effort: Pulmonary effort is normal. No respiratory distress.  Skin:    General: Skin is warm and dry.     Findings: No erythema or rash.     Comments: Large laceration to the dorsum of the left hand over the first webspace  Neurological:     Mental Status: He is alert.     Coordination: Coordination normal.     Comments: Intact extensor and flexor tendons, normal sensation to the left hand completely     ED Results / Procedures / Treatments   Labs (all labs ordered are listed, but only abnormal results are displayed) Labs Reviewed - No data to display  EKG None  Radiology DG Hand Complete Left  Result Date: 12/06/2022 CLINICAL DATA:  Trauma while on unhooking a trailer this evening. EXAM: LEFT HAND - COMPLETE 3+ VIEW COMPARISON:  None Available.  FINDINGS: Technically limited due to difficulty with positioning. The bones are subjectively under mineralized. No evidence of acute fracture or dislocation. A dressing overlies the radial aspect of thumb. Osteoarthritis most prominently affects the distal interphalangeal joint of the digits. No definite erosive change. No radiopaque foreign body. IMPRESSION: 1. No acute fracture or dislocation of the left hand. 2. 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 3. Technically limited exam. Electronically Signed   By: Narda Rutherford M.D.   On: 12/06/2022 19:57    Procedures .Marland KitchenLaceration Repair  Date/Time: 12/06/2022 8:26 PM  Performed by: Eber Hong, MD Authorized by: Eber Hong, MD   Consent:    Consent obtained:  Verbal   Consent given by:  Patient   Risks, benefits, and  alternatives were discussed: yes     Risks discussed:  Infection, pain, need for additional repair, nerve damage, poor wound healing, vascular damage, tendon damage, poor cosmetic result and retained foreign body   Alternatives discussed:  Delayed treatment Universal protocol:    Procedure explained and questions answered to patient or proxy's satisfaction: yes     Imaging studies available: yes     Site/side marked: yes     Immediately prior to procedure, a time out was called: yes     Patient identity confirmed:  Verbally with patient Anesthesia:    Anesthesia method:  Local infiltration   Local anesthetic:  Lidocaine 1% WITH epi Laceration details:    Location:  Hand   Hand location:  L hand, dorsum   Length (cm):  8   Depth (mm):  3 Pre-procedure details:    Preparation:  Patient was prepped and draped in usual sterile fashion and imaging obtained to evaluate for foreign bodies Exploration:    Limited defect created (wound extended): no     Hemostasis achieved with:  Direct pressure   Imaging obtained: x-ray     Imaging outcome: foreign body not noted     Wound exploration: wound explored through full range of motion and entire depth of wound visualized     Wound extent: fascia violated and muscle damage     Wound extent: no foreign body, no tendon damage, no underlying fracture and no vascular damage     Contaminated: no   Treatment:    Area cleansed with:  Saline   Amount of cleaning:  Extensive   Irrigation solution:  Sterile saline   Irrigation volume:  1000 Skin repair:    Repair method:  Sutures   Suture size:  5-0   Suture material:  Prolene   Suture technique:  Simple interrupted   Number of sutures:  7 Approximation:    Approximation:  Close Repair type:    Repair type:  Complex Post-procedure details:    Dressing:  Antibiotic ointment and sterile dressing   Procedure completion:  Tolerated well, no immediate complications Comments:     Wound was very  irregular, stellate, multiple skin flaps that had to be realigned and required both suture and pressure hemostasis.  Multiple sutures were placed, the patient required a course of antibiotics due to the fact that we could not repair deeper tissues, there was fascia that was missing and could not be approximated, skin was closed over top of this wound after extensive irrigation, antibiotics will be prescribed      Medications Ordered in ED Medications  acetaminophen (TYLENOL) tablet 650 mg (650 mg Oral Patient Refused/Not Given 12/06/22 2001)  lidocaine-EPINEPHrine (XYLOCAINE W/EPI) 2 %-1:200000 (PF) injection 20  mL (20 mLs Intradermal Given 12/06/22 2005)  Tdap (BOOSTRIX) injection 0.5 mL (0.5 mLs Intramuscular Given 12/06/22 2002)    ED Course/ Medical Decision Making/ A&P                             Medical Decision Making Amount and/or Complexity of Data Reviewed Radiology: ordered.  Risk OTC drugs. Prescription drug management.    Make sure there is no fractures Primary wound repair after imaging Update tetanus, pain control Local infiltrated with lidocaine irrigate and primary closure Patient agreeable to the plan  Imaging negative for foreign body, nothing foreign seen on exploration        Final Clinical Impression(s) / ED Diagnoses Final diagnoses:  Laceration of muscle of left hand  Complicated laceration of hand, left, initial encounter    Rx / DC Orders ED Discharge Orders          Ordered    doxycycline (VIBRAMYCIN) 100 MG capsule  2 times daily        12/06/22 2031              Eber Hong, MD 12/06/22 2032

## 2022-12-07 ENCOUNTER — Ambulatory Visit
Admission: EM | Admit: 2022-12-07 | Discharge: 2022-12-07 | Disposition: A | Discharge status: Home / Self Care | Payer: 59

## 2022-12-07 DIAGNOSIS — S61412D Laceration without foreign body of left hand, subsequent encounter: Secondary | ICD-10-CM

## 2022-12-07 NOTE — ED Triage Notes (Signed)
Pt reports he came today to have his dressing on his left hand that needs to be change. ER suggested to come her, primary, or back to the ed.  Has a dressing on his right wrist as well.

## 2022-12-07 NOTE — Discharge Instructions (Signed)
Continue changing dressings at home twice daily, follow-up if the area becomes red, swollen or has thick drainage.  Follow-up in 13 to 14 days for suture removal

## 2022-12-08 NOTE — ED Provider Notes (Signed)
[facility]    CSN: [id number] Arrival date & time: 12/07/22  1334      History   Chief Complaint No chief complaint on file.   HPI Angel [name] is a 82 y.o. male.   Presenting today following up on hand laceration to the left hand that occurred on 12/06/2022.  He went to the emergency department and had sutures placed to the area.  Was told to follow-up if he needed help with his dressing changes so presents today requesting assistance with a dressing change.  He was placed on doxycycline which she has been faithfully taking and has been keeping the area clean and elevated.  Denies worsening pain, drainage or bleeding issues, numbness, tingling, loss of range of motion.  Had x-rays in the emergency department that were negative for acute bony abnormality.    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    Patient Active Problem List   Diagnosis Date Noted   Pulmonary hypertension (HCC) 11/17/2021   DOE (dyspnea on exertion) 07/05/2021   Abnormal CT of the chest 11/16/2020   Chronic respiratory failure with hypoxia (HCC) 10/19/2020   Acute cystitis without hematuria 07/07/2019   Calculus of bladder 07/07/2019   BPH (benign prostatic hyperplasia) 02/24/2017   ABDOMINAL PAIN-GENERALIZED 02/16/2010   ESSENTIAL HYPERTENSION, BENIGN 02/02/2010   ELEVATED BLOOD PRESSURE 01/31/2010   MIXED HYPERLIPIDEMIA 01/18/2010   CORONARY ATHEROSCLEROSIS NATIVE  CORONARY ARTERY 01/18/2010   FATIGUE 01/18/2010    Past Surgical History:  Procedure Laterality Date   COLONOSCOPY     COLONOSCOPY     CORONARY ANGIOPLASTY  2010   Dr Eden Emms   CYSTOSCOPY WITH LITHOLAPAXY N/A 02/24/2017   Procedure: CYSTOSCOPY WITH LITHOLAPAXY;  Surgeon: Malen Gauze, MD;  Location: [facility];  Service: Urology;  Laterality: N/A;   CYSTOSCOPY WITH URETHRAL DILATATION  02/24/2017   Procedure: CYSTOSCOPY WITH URETHRAL DILATATION;  Surgeon: Malen Gauze, MD;  Location: [facility];  Service: Urology;;   DOPPLER ECHOCARDIOGRAPHY     EAR CYST EXCISION Left 07/23/2012   Procedure: EXCISION LEFT EAR LOBE;  Surgeon: Suzanna Obey, MD;  Location: [facility];  Service: ENT;  Laterality: Left;   HOLMIUM LASER APPLICATION N/A 02/24/2017   Procedure: HOLMIUM LASER APPLICATION OF BLADDER CALCULI;  Surgeon: Malen Gauze, MD;  Location: [facility];  Service: Urology;  Laterality: N/A;   IR RADIOLOGIST EVAL & MGMT  11/28/2016       Home Medications    Prior to Admission medications   Medication Sig Start Date End Date Taking? Authorizing Provider  albuterol (VENTOLIN HFA) 108 (90 Base) MCG/ACT inhaler Inhale 2 puffs into the lungs every 6 (six) hours as needed for wheezing or shortness of breath. 05/16/21   Particia Nearing, PA-C  aspirin EC 81 MG tablet Take 81 mg by mouth daily.    [provider]  doxycycline (VIBRAMYCIN) 100 MG capsule Take 1 capsule (100 mg total) by mouth 2 (two) times daily. 12/06/22   Eber Hong, MD  nitroGLYCERIN (NITROSTAT) 0.4 MG SL tablet PLACE 1 TAB UNDER TONGUE EVERY 5 MIN IF NEEDED FOR CHEST PAIN. MAY USE 3 TIMES.NORELIEF CALL 911. 07/27/20   Antoine Poche, MD    Family History No family history on file.  Social History Social History   Tobacco Use   Smoking status: Former    Current packs/day: 0.00    Average packs/day: 1.5 packs/day for 45.0 years (67.5 ttl pk-yrs)    Types: Cigarettes    Start date: 12/11/1964    Quit  date: 12/11/2009    Years since quitting: 13.0   Smokeless tobacco: Never  Vaping Use   Vaping status: Never Used  Substance Use Topics   Alcohol use: No   Drug use: No     Allergies   Ciprofloxacin, Crestor [rosuvastatin], Penicillins, Tramadol, Lisinopril, and Macrodantin [nitrofurantoin]   Review of Systems Review of Systems Per HPI  Physical Exam Triage Vital Signs ED Triage Vitals  Encounter Vitals Group     BP 12/07/22 1339 121/71     Systolic BP Percentile --      Diastolic BP Percentile --      Pulse Rate 12/07/22 1339 71     Resp 12/07/22 1339 (!) 28     Temp 12/07/22 1339 98.2 F (36.8 C)     Temp Source 12/07/22 1339 Oral     SpO2 12/07/22 1339 (!) 82 %     Weight --      Height --      Head Circumference --      Peak Flow --      Pain Score 12/07/22 1414 0     Pain Loc --      Pain Education --      Exclude from Growth Chart --    No data found.  Updated Vital Signs BP 121/71 (BP Location: Right Arm)   Pulse 71   Temp 98.2 F (36.8 C) (Oral)   Resp (!) 28   SpO2 94%   Visual Acuity Right Eye Distance:   Left Eye Distance:   Bilateral Distance:    Right Eye Near:   Left Eye Near:    Bilateral Near:     Physical Exam Vitals and nursing note reviewed.  Constitutional:      Appearance: Normal appearance.  HENT:     Head: Atraumatic.  Eyes:     Extraocular Movements: Extraocular movements intact.     Conjunctiva/sclera: Conjunctivae normal.  Cardiovascular:     Rate and Rhythm: Normal rate and regular rhythm.  Pulmonary:     Effort: Pulmonary effort is normal.     Breath sounds: Normal breath sounds.  Musculoskeletal:        General: Normal range of motion.     Cervical back: Normal range of motion and neck supple.  Skin:    General: Skin is warm.     Comments: Large U-shaped laceration to the dorsal left hand, simple interrupted sutures intact, bleeding well-controlled, no significant erythema, edema and no discharge  Neurological:      General: No focal deficit present.     Mental Status: He is oriented to person, place, and time.     Comments: Left upper extremity neurovascularly intact  Psychiatric:  Mood and Affect: Mood normal.        Thought Content: Thought content normal.        Judgment: Judgment normal.      UC Treatments / Results  Labs (all labs ordered are listed, but only abnormal results are displayed) Labs Reviewed - No data to display  EKG   Radiology DG Hand Complete Left  Result Date: 12/06/2022 CLINICAL DATA:  Trauma while on unhooking a trailer this evening. EXAM: LEFT HAND - COMPLETE 3+ VIEW COMPARISON:  None Available. FINDINGS: Technically limited due to difficulty with positioning. The bones are subjectively under mineralized. No evidence of acute fracture or dislocation. A dressing overlies the radial aspect of thumb. Osteoarthritis most prominently affects the distal interphalangeal joint of the digits. No definite erosive change. No radiopaque foreign body. IMPRESSION: 1. No acute fracture or dislocation of the left hand. 2. 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 3. Technically limited exam. Electronically Signed   By: Narda Rutherford M.D.   On: 12/06/2022 19:57    Procedures Procedures (including critical care time)  Medications Ordered in UC Medications - No data to display  Initial Impression / Assessment and Plan / UC Course  I have reviewed the triage vital signs and the nursing notes.  Pertinent labs & imaging results that were available during my care of the patient were reviewed by me and considered in my medical decision making (see chart for details).     Wound cleaned, dressings changed.  Healing as expected.  Complete antibiotics, continue dressing changes at home as discussed today.  Follow-up for suture removal as recommended in 14 days.  Final Clinical Impressions(s) / UC Diagnoses   Final diagnoses:  Laceration of left hand  without foreign body, subsequent encounter     Discharge Instructions      Continue changing dressings at home twice daily, follow-up if the area becomes red, swollen or has thick drainage.  Follow-up in 13 to 14 days for suture removal    ED Prescriptions   None    PDMP not reviewed this encounter.   Particia Nearing, [place] 12/08/22 1254

## 2022-12-19 ENCOUNTER — Ambulatory Visit: Payer: Medicare HMO | Admitting: Cardiology

## 2023-01-01 ENCOUNTER — Ambulatory Visit
Admission: EM | Admit: 2023-01-01 | Discharge: 2023-01-01 | Disposition: A | Discharge status: Home / Self Care | Payer: 59
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
## 2023-01-09 ENCOUNTER — Ambulatory Visit (INDEPENDENT_AMBULATORY_CARE_PROVIDER_SITE_OTHER): Payer: 59 | Admitting: Internal Medicine

## 2023-01-09 ENCOUNTER — Encounter: Payer: Self-pay | Admitting: Internal Medicine

## 2023-01-09 VITALS — BP 119/56 | HR 72 | Ht 70.0 in | Wt 130.0 lb

## 2023-01-09 DIAGNOSIS — R9389 Abnormal findings on diagnostic imaging of other specified body structures: Secondary | ICD-10-CM | POA: Diagnosis not present

## 2023-01-09 DIAGNOSIS — I272 Pulmonary hypertension, unspecified: Secondary | ICD-10-CM

## 2023-01-09 DIAGNOSIS — J9611 Chronic respiratory failure with hypoxia: Secondary | ICD-10-CM | POA: Diagnosis not present

## 2023-01-09 NOTE — Assessment & Plan Note (Addendum)
Echo 08/29/20 1. LVEF  55 to 60%. With Grade 1 diastolic dysfunction   2. Right ventricular systolic function is normal. The right ventricular  size is normal. There is severely elevated PAS  3. The mitral valve is normal in structure. Trivial mitral valve  regurgitation. No evidence of mitral stenosis.   4. Tricuspid valve regurgitation is mild to moderate.   5. The aortic valve is tricuspid. There is mild calcification of the  aortic valve. MILD AI  There is mild thickening of the aortic valve  No AS     6. 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 With nl LA and RA  7. The inferior vena cava is normal in size with greater than 50%  respiratory variability, suggesting right atrial pressure of 3 mmHg  Well compensated, no additional w/u rec at this point / just rx adequate 02   Each maintenance medication was reviewed in detail including emphasizing most importantly the difference between maintenance and prns and under what circumstances the prns are to be triggered using an action plan format where appropriate.  Total time for H and P, chart review, counseling, reviewing 02/pulse ox  device(s) , directly observing portions of ambulatory 02 saturation study/ and generating customized AVS unique to this office visit / same day charting  > 30 min
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
## 2023-01-09 NOTE — Assessment & Plan Note (Addendum)
HRCT 11/14/20 >>> non diagnotic for UIP  / mpns - repeat HRCT ordered 01/09/2023 but this is looking less and less like UIP   Discussed in detail all the  indications, usual  risks and alternatives  relative to the benefits with patient who agrees to proceed with w/u as outlined.

## 2023-01-09 NOTE — Patient Instructions (Addendum)
My office will be contacting you by phone for referral to High resolution CT chest   - if you don't hear back from my office within one week please call us back or notify us thru MyChart and we'll address it right away.    Make sure you check your oxygen saturation  AT  your highest level of activity (not after you stop)   to be sure it stays over 90% and adjust  02 flow upward to maintain this level if needed but remember to turn it back to previous settings when you stop (to conserve your supply).    Please schedule a follow up visit in 6 \ months but call sooner if needed

## 2023-01-16 ENCOUNTER — Ambulatory Visit (INDEPENDENT_AMBULATORY_CARE_PROVIDER_SITE_OTHER): Payer: 59 | Admitting: Pulmonary Disease

## 2023-01-16 ENCOUNTER — Encounter: Payer: Self-pay | Admitting: Pulmonary Disease

## 2023-01-16 VITALS — BP 105/64 | HR 76 | Ht 70.0 in | Wt 130.8 lb

## 2023-01-16 DIAGNOSIS — I272 Pulmonary hypertension, unspecified: Secondary | ICD-10-CM | POA: Diagnosis not present

## 2023-01-16 DIAGNOSIS — J9611 Chronic respiratory failure with hypoxia: Secondary | ICD-10-CM | POA: Diagnosis not present

## 2023-01-16 DIAGNOSIS — J849 Interstitial pulmonary disease, unspecified: Secondary | ICD-10-CM | POA: Diagnosis not present

## 2023-01-16 NOTE — Patient Instructions (Addendum)
  X Rx for humidifier with O2 to DME Use saline drops -each nare -at bedtime Try to use your oxygen when you sleep & 24/7 if possible  X schedule echo  X split night study

## 2023-01-16 NOTE — Assessment & Plan Note (Addendum)
I emphasized oxygen use at all times We will provide him with a humidifier and I also asked him to use saline drops.  Hopefully this will improve his oxygen usage
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
## 2023-01-16 NOTE — Assessment & Plan Note (Signed)
Repeat HRCT is planned Previously "indeterminate" for UIP-also has multiple nodules ? DIPNECH vs sarcoidosis may be a concern

## 2023-01-16 NOTE — Assessment & Plan Note (Addendum)
Will evaluate with repeat echo but doubt that we will subject him to right heart cath.  Oxygen usage was emphasized

## 2023-01-27 ENCOUNTER — Ambulatory Visit (HOSPITAL_COMMUNITY)
Admission: RE | Admit: 2023-01-27 | Discharge: 2023-01-27 | Disposition: A | Discharge status: Home / Self Care | Payer: 59 | Source: Ambulatory Visit | Attending: Internal Medicine | Admitting: Internal Medicine

## 2023-01-27 DIAGNOSIS — R9389 Abnormal findings on diagnostic imaging of other specified body structures: Secondary | ICD-10-CM | POA: Insufficient documentation

## 2023-01-30 ENCOUNTER — Telehealth: Payer: Self-pay | Admitting: Internal Medicine

## 2023-01-30 NOTE — Telephone Encounter (Signed)
Pt is requesting results.

## 2023-01-30 NOTE — Telephone Encounter (Signed)
Called and lvm to let patient know that we haven't gotten the result back once the report is back we call with results

## 2023-01-30 NOTE — Telephone Encounter (Signed)
Patient is calling for results on his CT Scan. Please call [phone]

## 2023-02-10 ENCOUNTER — Telehealth: Payer: Self-pay | Admitting: Internal Medicine

## 2023-02-10 NOTE — Telephone Encounter (Signed)
Call report  CT High Resolution from 9/16  [phone]

## 2023-02-11 ENCOUNTER — Encounter: Payer: Self-pay | Admitting: Internal Medicine

## 2023-02-11 DIAGNOSIS — R918 Other nonspecific abnormal finding of lung field: Secondary | ICD-10-CM | POA: Insufficient documentation

## 2023-02-13 NOTE — Telephone Encounter (Signed)
Please advise on the CT results.

## 2023-02-13 NOTE — Telephone Encounter (Signed)
Lm x1 for the patient.

## 2023-02-13 NOTE — Telephone Encounter (Signed)
I have attempted sev times to call him to go over study.  The main concern is growth in several tiny nodules taht need PET scan to eval further with f/u with me in office same day .  If he wants to put this off until sees me that's fine  If he wants to speak directly to me then I need a window of time when he will be at his phone

## 2023-02-14 NOTE — Telephone Encounter (Signed)
Lm x2 for patient. Will call once more due to nature of call.   

## 2023-02-17 NOTE — Telephone Encounter (Signed)
I have notified the patient. He wanted to be seen in the office and go over her CT scan in person before scheduling a PET scan. I scheduled him an appt for tomorrow at 11:15am with Dr. Sherene Sires.  Nothing further needed.

## 2023-02-17 NOTE — Telephone Encounter (Signed)
Lm x3 for patient.  Mychart message sent and letter mailed to address on file.

## 2023-02-18 ENCOUNTER — Ambulatory Visit (INDEPENDENT_AMBULATORY_CARE_PROVIDER_SITE_OTHER): Payer: 59 | Admitting: Internal Medicine

## 2023-02-18 ENCOUNTER — Encounter: Payer: Self-pay | Admitting: Internal Medicine

## 2023-02-18 VITALS — BP 96/60 | HR 76 | Ht 70.0 in | Wt 131.0 lb

## 2023-02-18 DIAGNOSIS — I272 Pulmonary hypertension, unspecified: Secondary | ICD-10-CM

## 2023-02-18 DIAGNOSIS — J9611 Chronic respiratory failure with hypoxia: Secondary | ICD-10-CM | POA: Diagnosis not present

## 2023-02-18 DIAGNOSIS — R918 Other nonspecific abnormal finding of lung field: Secondary | ICD-10-CM
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
## 2023-02-18 NOTE — Patient Instructions (Signed)
Make sure you check your oxygen saturation  AT  your highest level of activity (not after you stop)   to be sure it stays over 90% and adjust  02 flow upward to maintain this level if needed but remember to turn it back to previous settings when you stop (to conserve your supply).   PET scan is an option but I don't think it will help [place] treat you unless Dr Ronne Binning feels otherwise   Please schedule a follow up visit in 6 months but call sooner if needed

## 2023-02-18 NOTE — Assessment & Plan Note (Signed)
Echo 08/29/20 1. LVEF  55 to 60%. With Grade 1 diastolic dysfunction   2. Right ventricular systolic function is normal. The right ventricular  size is normal. There is severely elevated PAS  3. The mitral valve is normal in structure. Trivial mitral valve  regurgitation. No evidence of mitral stenosis.   4. Tricuspid valve regurgitation is mild to moderate.   5. The aortic valve is tricuspid. There is mild calcification of the  aortic valve. MILD AI  There is mild thickening of the aortic valve  No AS     6. 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 With nl LA and RA  7. The inferior vena cava is normal in size with greater than 50%  respiratory variability, suggesting right atrial pressure of 3 mmHg.   Most likely more WHO 3 than 2 but point is moot as he has declined RHC so best rx is adequate 02 (see sep a/p)  F/u q 6 m unless enters hospice program.           Each maintenance medication was reviewed in detail including emphasizing most importantly the difference between maintenance and prns and under what circumstances the prns are to be triggered using an action plan format where appropriate.  Total time for H and P, chart review, counseling, reviewing 02/ pulse ox  device(s) and generating customized AVS unique to this office visit / same day charting = 43 min summary f/u ov

## 2023-02-26 ENCOUNTER — Ambulatory Visit (HOSPITAL_COMMUNITY)
Admission: RE | Admit: 2023-02-26 | Discharge: 2023-02-26 | Disposition: A | Discharge status: Home / Self Care | Payer: 59 | Source: Ambulatory Visit | Attending: Urology | Admitting: Urology

## 2023-02-26 DIAGNOSIS — N2889 Other specified disorders of kidney and ureter: Secondary | ICD-10-CM | POA: Insufficient documentation

## 2023-02-27 ENCOUNTER — Ambulatory Visit (HOSPITAL_COMMUNITY)
Admission: RE | Admit: 2023-02-27 | Discharge: 2023-02-27 | Disposition: A | Discharge status: Home / Self Care | Payer: 59 | Source: Ambulatory Visit | Attending: Pulmonary Disease | Admitting: Pulmonary Disease

## 2023-02-27 DIAGNOSIS — I272 Pulmonary hypertension, unspecified: Secondary | ICD-10-CM | POA: Insufficient documentation

## 2023-02-27 DIAGNOSIS — J9611 Chronic respiratory failure with hypoxia: Secondary | ICD-10-CM | POA: Insufficient documentation

## 2023-02-27 LAB — ECHOCARDIOGRAM COMPLETE
Area-P 1/2: 2.32 cm2
P 1/2 time: 530 ms
S' Lateral: 2.8 cm

## 2023-02-27 NOTE — Progress Notes (Signed)
*  PRELIMINARY RESULTS* Echocardiogram 2D Echocardiogram has been performed.  Stacey Drain 02/27/2023, 12:21 PM

## 2023-03-05 ENCOUNTER — Ambulatory Visit: Payer: 59 | Admitting: Urology

## 2023-03-05 VITALS — BP 106/63 | HR 81

## 2023-03-05 DIAGNOSIS — N210 Calculus in bladder: Secondary | ICD-10-CM | POA: Diagnosis not present

## 2023-03-05 DIAGNOSIS — N2889 Other specified disorders of kidney and ureter: Secondary | ICD-10-CM

## 2023-03-05 LAB — MICROSCOPIC EXAMINATION: Bacteria, UA: NONE SEEN

## 2023-03-05 LAB — URINALYSIS, ROUTINE W REFLEX MICROSCOPIC
Bilirubin, UA: NEGATIVE
Glucose, UA: NEGATIVE
Leukocytes,UA: NEGATIVE
Nitrite, UA: NEGATIVE
Specific Gravity, UA: 1.02 (ref 1.005–1.030)
Urobilinogen, Ur: 4 mg/dL — ABNORMAL HIGH (ref 0.2–1.0)
pH, UA: 6.5 (ref 5.0–7.5)

## 2023-03-05 NOTE — Progress Notes (Signed)
03/05/2023 3:12 PM   Angel [name] 30-May-1940 [id number]  Referring provider: Tracey Harries, MD [address] [place],  [place] [address]  Followup renal mass and bladder calculi   HPI: [name] is a 81yo here for followup for a right renal mass and bladder calculi. No bladder stone passage since last visit. He has lost 15-20lbs since last visit. Renal US shows stable 8cm right renal mass. He underwent CT of his chest which showed a 1.6cm lung mass and multiple small lung masses. He on 2L supplemental oxygen. He has decreased energy since last visit.    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    Surgical History: Past Surgical History:  Procedure Laterality Date   COLONOSCOPY     COLONOSCOPY     CORONARY ANGIOPLASTY  2010   Dr Angel [name]   CYSTOSCOPY WITH LITHOLAPAXY N/A 02/24/2017   Procedure: CYSTOSCOPY WITH LITHOLAPAXY;  Surgeon: Angel Gauze, MD;  Location: [facility];  Service: Urology;  Laterality: N/A;   CYSTOSCOPY WITH URETHRAL DILATATION  02/24/2017   Procedure: CYSTOSCOPY WITH URETHRAL DILATATION;  Surgeon: Angel Gauze, MD;  Location: [facility];  Service: Urology;;   DOPPLER ECHOCARDIOGRAPHY     EAR CYST EXCISION Left 07/23/2012   Procedure: EXCISION LEFT EAR LOBE;  Surgeon: Angel Obey, MD;  Location: [facility];  Service: ENT;  Laterality: Left;   HOLMIUM LASER APPLICATION N/A 02/24/2017   Procedure: HOLMIUM LASER  APPLICATION OF BLADDER CALCULI;  Surgeon: Angel Gauze, MD;  Location: [facility];  Service: Urology;  Laterality: N/A;   IR RADIOLOGIST EVAL & MGMT  11/28/2016    Home Medications:  Allergies as of 03/05/2023       Reactions   Ciprofloxacin Other (See Comments)   Crestor [rosuvastatin] Other (See Comments)   Unknown    Penicillins Other (See Comments)   Per patient causes gi upset with high doses. He says he does OK with shots but gets GI upset with PO penicillins Can take small doses with no issues   Tramadol Other (See Comments)   Confusion   Lisinopril Rash, Cough   Patient says it caused bp to increase and caused a rash on his legs    Macrodantin [nitrofurantoin] Rash        Medication List        Accurate as of March 05, 2023  3:12 PM. If you have any questions, ask your nurse or doctor.          albuterol 108 (90 Base) MCG/ACT inhaler Commonly known as: VENTOLIN HFA Inhale 2 puffs into the lungs every 6 (six) hours as needed for wheezing or shortness of breath.   aspirin EC 81 MG tablet Take 81 mg by mouth daily.  aspirin EC 325 MG tablet Take 325 mg by mouth daily.   nitroGLYCERIN 0.4 MG SL tablet Commonly known as: NITROSTAT PLACE 1 TAB UNDER TONGUE EVERY 5 MIN IF NEEDED FOR CHEST PAIN. MAY USE 3 TIMES.NORELIEF CALL 911.        Allergies:  Allergies  Allergen Reactions   Ciprofloxacin Other (See Comments)   Crestor [Rosuvastatin] Other (See Comments)    Unknown    Penicillins Other (See Comments)    Per patient causes gi upset with high doses. He says he does OK with shots but gets GI upset with PO penicillins Can take small doses with no issues   Tramadol Other (See Comments)    Confusion   Lisinopril Rash and Cough    Patient says it caused bp to increase and caused a rash on his legs    Macrodantin [Nitrofurantoin] Rash    Family History: No family history on file.  Social History:  reports that he quit smoking about 13 years ago.  His smoking use included cigarettes. He started smoking about 58 years ago. He has a 67.5 pack-year smoking history. He has never used smokeless tobacco. He reports that he does not drink alcohol and does not use drugs.  ROS: All other review of systems were reviewed and are negative except what is noted above in HPI  Physical Exam: BP 106/63   Pulse 81   Constitutional:  Alert and oriented, No acute distress. HEENT: [place] AT, moist mucus membranes.  Trachea midline, no masses. Cardiovascular: No clubbing, cyanosis, or edema. Respiratory: Normal respiratory effort, no increased work of breathing. GI: Abdomen is soft, nontender, nondistended, no abdominal masses GU: No CVA tenderness.  Lymph: No cervical or inguinal lymphadenopathy. Skin: No rashes, bruises or suspicious lesions. Neurologic: Grossly intact, no focal deficits, moving all 4 extremities. Psychiatric: Normal mood and affect.  Laboratory Data: Lab Results  Component Value Date   WBC 6.1 07/05/2021   HGB 16.4 07/05/2021   HCT 48.1 07/05/2021   MCV 91 07/05/2021   PLT 180 07/05/2021    Lab Results  Component Value Date   CREATININE 1.34 (H) 07/05/2021    No results found for: "PSA"  No results found for: "TESTOSTERONE"  No results found for: "HGBA1C"  Urinalysis    Component Value Date/Time   COLORURINE YELLOW 04/04/2020 1925   APPEARANCEUR Clear 09/04/2022 1325   LABSPEC 1.016 04/04/2020 1925   PHURINE 6.0 04/04/2020 1925   GLUCOSEU Negative 09/04/2022 1325   HGBUR MODERATE (A) 04/04/2020 1925   BILIRUBINUR Negative 09/04/2022 1325   KETONESUR trace (5) (A) 12/03/2020 1104   KETONESUR NEGATIVE 04/04/2020 1925   PROTEINUR 2+ (A) 09/04/2022 1325   PROTEINUR 100 (A) 04/04/2020 1925   UROBILINOGEN 0.2 12/03/2020 1104   UROBILINOGEN 0.2 07/25/2012 0446   NITRITE Negative 09/04/2022 1325   NITRITE NEGATIVE 04/04/2020 1925   LEUKOCYTESUR Negative 09/04/2022 1325   LEUKOCYTESUR LARGE (A) 04/04/2020 1925     Lab Results  Component Value Date   LABMICR See below: 09/04/2022   WBCUA 0-5 09/04/2022   LABEPIT 0-10 09/04/2022   BACTERIA None seen 09/04/2022    Pertinent Imaging: Renal US 02/26/2023: Images reviewed and discussed with the patient  No results found for this or any previous visit.  No results found for this or any previous visit.  No results found for this or any previous visit.  No results found for this or any previous visit.  Results for orders placed during the hospital encounter of 08/26/22  Ultrasound renal complete  Narrative CLINICAL DATA:  Follow-up renal mass  EXAM: RENAL / URINARY TRACT ULTRASOUND COMPLETE  COMPARISON:  February 15, 2022  FINDINGS: Right Kidney:  Renal measurements: 10.4 x 4.8 x 5.2 cm = volume: 135 mL. The known lower pole solid renal mass measures 8.7 x 7.1 x 7.4 cm today versus 8.4 x 5.7 x 8 cm February 15, 2022 and 7.7 x 6.7 x 7.3 cm July 02, 2021.  Left Kidney:  Renal measurements: 10.1 x 4.3 x 4.4 cm = volume: 98.4 mL. Echogenicity within normal limits. No mass or hydronephrosis visualized.  Bladder:  Appears normal for degree of bladder distention.  Other:  The prostate volume is 90 cc  IMPRESSION: 1. The known solid mass in the lower pole of the right kidney measures 8.7 x 7.1 x 7.4 cm today versus 8.4 x 5.7 x 8 cm February 15, 2022 and 7.7 x 6.7 x 7.3 cm July 02, 2021. Overall, there appears to be continued interval growth. 2. The prostate volume is 90 cc.   Electronically Signed By: Gerome Sam III M.D. On: 08/26/2022 17:21  No valid procedures specified. No results found for this or any previous visit.  No results found for this or any previous visit.   Assessment & Plan:    1. Renal mass -We discussed proceeding with PET scan for his Lung nodules and the patient wishes to proceed with a PET scan. We will proceed with observation for his right renal mass.  - Urinalysis, Routine w reflex  microscopic  2. Calculus of bladder -continue surveillance   No follow-ups on file.  Wilkie Aye, MD  [facility] [place]

## 2023-03-09 ENCOUNTER — Encounter: Payer: Self-pay | Admitting: Urology

## 2023-03-09 NOTE — Patient Instructions (Signed)
Kidney Cancer  Kidney cancer is a type of cancer in which an abnormal growth of cells (tumor) forms in one or both kidneys. The kidneys filter waste from your blood and make urine. Kidney cancer may spread to other parts of your body. This type of cancer may also be called renal cell carcinoma. What are the causes? The cause of this condition is not known. What increases the risk? You may be more likely to develop kidney cancer if: You are over age 60. You have certain conditions that are passed from parent to child (inherited). These include von Hippel-Lindau disease, tuberous sclerosis, and hereditary papillary renal carcinoma. You smoke or have been exposed to certain chemicals. You have advanced kidney disease, especially if you need to use a machine to clean your blood (dialysis). You are obese. You have high blood pressure (hypertension). You are male. What are the signs or symptoms? At first, kidney cancer may not cause any symptoms. As the cancer grows, symptoms may include: Blood in the urine. Pain in the upper back or abdomen, just below the rib cage. You may feel pain on one or both sides of your body. Tiredness (fatigue). Weight loss that cannot be explained. Fever. How is this diagnosed? This condition may be diagnosed based on: Your symptoms. Your medical history. A physical exam. You may also have tests, such as: Blood and urine tests. X-rays. Imaging tests, such as CT scans, MRIs, and PET scans. Angiogram. This is when dye is injected into your blood to show your blood vessels. Intravenous pyelogram. This is when dye is injected into your blood to show your kidneys and the other organs that help with making and storing urine. Biopsy. This is when a piece of tissue is removed from your kidney and looked at under a microscope. Your cancer will be assessed (staged), based on how severe it is and how much it has spread. How is this treated? Treatment depends on the type  and stage of the cancer. Treatment may include: Surgery to remove: Just the tumor (nephron-sparing surgery). The entire kidney (nephrectomy). The kidney, some of the healthy tissue around it, nearby lymph nodes, and sometimes the adrenal gland (radical nephrectomy). Medicines to: Kill cancer cells (chemotherapy). Help your body's disease-fighting system (immune system) fight cancer cells. This is known as immunotherapy. Radiation therapy. This uses high-energy rays to kill cancer cells. Targeted therapy to only attack genes and proteins that allow a tumor to grow. This type of therapy tries to limit damage to your healthy cells. Cryoablation. This uses gas or liquid to freeze cancer cells. It is sent through a needle. Radiofrequency ablation. This uses high-energy radio waves to destroy cancer cells. It is sent through a needle-like probe. Embolization. This is a procedure to block the artery that supplies blood to the tumor. Follow these instructions at home: Eating and drinking Some of your treatments may affect your appetite and your ability to chew and swallow. If you have problems eating, or if you do not have an appetite, meet with an expert in diet and nutrition (dietitian). If you have side effects that affect eating, it may help to: Eat smaller meals more often. Eat bland or soft foods. Avoid foods that are hot, spicy, or hard to swallow. Drink shakes or supplements that are high in nutrition and calories. Do not drink alcohol. Lifestyle Do not use any products that contain nicotine or tobacco. These products include cigarettes, chewing tobacco, and vaping devices, such as e-cigarettes. If you need   help quitting, ask your health care provider. Get enough sleep. Most adults need 6-8 hours of sleep each night. During treatment, you may need more sleep. General instructions Take over-the-counter and prescription medicines only as told by your health care provider. Consider joining a  support group. This can help you cope with the stress of having kidney cancer. Work with your health care provider to manage any side effects of treatment. Keep all follow-up visits. Your health care provider will want to make sure your treatment is working. Where to find more information American Cancer Society: cancer.org National Cancer Institute (NCI): cancer.gov Contact a health care provider if: You bruise or bleed easily. You lose weight without trying. You have new or worse fatigue or weakness. You have a fever. Your pain suddenly increases. You have more blood in your urine. Your skin or the white parts of your eyes turn yellow (jaundice). Get help right away if: You have chest pain. You are short of breath. You have irregular heartbeats (palpitations). These symptoms may be an emergency. Get help right away. Call 911. Do not wait to see if the symptoms will go away. Do not drive yourself to the hospital. This information is not intended to replace advice given to you by your health care provider. Make sure you discuss any questions you have with your health care provider. Document Revised: 10/09/2021 Document Reviewed: 10/09/2021 Elsevier Patient Education  2024 Elsevier Inc.  

## 2023-03-20 ENCOUNTER — Telehealth: Payer: Self-pay | Admitting: Pulmonary Disease

## 2023-03-20 NOTE — Telephone Encounter (Signed)
Pt wants to cancel  his Split Night. He's not longer interested in getting it done

## 2023-04-03 ENCOUNTER — Encounter: Payer: Self-pay | Admitting: Cardiology

## 2023-04-03 ENCOUNTER — Ambulatory Visit: Payer: 59 | Attending: Cardiology | Admitting: Cardiology

## 2023-04-03 VITALS — BP 122/72 | HR 70 | Ht 70.0 in | Wt 137.6 lb

## 2023-04-03 DIAGNOSIS — I2510 Atherosclerotic heart disease of native coronary artery without angina pectoris: Secondary | ICD-10-CM | POA: Diagnosis not present

## 2023-04-03 DIAGNOSIS — I272 Pulmonary hypertension, unspecified: Secondary | ICD-10-CM | POA: Diagnosis not present

## 2023-04-03 DIAGNOSIS — I10 Essential (primary) hypertension: Secondary | ICD-10-CM | POA: Diagnosis not present

## 2023-04-03 MED ORDER — NITROGLYCERIN 0.4 MG SL SUBL: 0.4000 mg | SUBLINGUAL_TABLET | SUBLINGUAL | 3 refills | Status: AC | PRN

## 2023-04-03 NOTE — Patient Instructions (Signed)
Medication Instructions:  Continue all current medications.   Labwork: none  Testing/Procedures: none  Follow-Up: 6 months   Any Other Special Instructions Will Be Listed Below (If Applicable).   If you need a refill on your cardiac medications before your next appointment, please call your pharmacy.  

## 2023-04-03 NOTE — Progress Notes (Signed)
Clinical Summary Angel [name] is a 82 y.o.male seen today for follow up of the following medical problems.      1. CAD - history of prior stent  in 9/ 2011 to RCA  He has refused statin. Some renal dysfunction, not on ARB. Has ACE allergy as well   - no recent chest pains - compliant with meds - 09/2021 TC 130 TG 92 HDL 39 LDL 73 without medications, has refused statins.  - no recent chest pain - chronic stbale SOB   2. HTN - higher doses of norvasc caused some leg swelling and dizziness -not requiring meds         3. SOB/Pulmonary HTN - 08/2020 echo LVEF 55-60%, grade I dd, normal RV function, severe pulm HTN PASP 72 - PA Angel [name] had referred to CHF clinic for pulmonary HTN - 10/2020 VQ no PE - 11/2020 CT chest: early/mild interstitial lung disease,  10/2020 PFTs sevre diffusion defect   02/2023 echo: LVEF 50-55%, grade I DD, RV pressure and volume overload, mod RV dysfucntion, severe RVE, severe pulmonary HTN 10/2020 VQ negative  - turned OSA testing.  - from pulmonary note he turned down RHC   4. Possible ILD - ongoing workup by pulmonary - PFTs with severe diffusion defect, recent abnormal high res CT   - typically on 2-3 L   5. CKD - Cr 1.4 and stable  6. 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 He says he does OK with shots but gets GI upset with PO penicillins Can take small doses with no issues   Tramadol Other (See Comments)    Confusion   Lisinopril Rash and Cough    Patient says it caused bp to increase and caused a rash on his legs    Macrodantin [Nitrofurantoin] Rash     Current Outpatient Medications  Medication Sig Dispense Refill   albuterol (VENTOLIN HFA) 108 (90 Base) MCG/ACT inhaler Inhale 2 puffs into the lungs every 6 (six) hours as needed for wheezing or shortness of breath. (Patient not taking: Reported on 03/05/2023) 18 g 0   aspirin EC 325 MG tablet Take 325 mg by mouth daily.     aspirin EC 81 MG tablet Take 81 mg by mouth daily.     nitroGLYCERIN (NITROSTAT) 0.4 MG SL tablet PLACE 1 TAB UNDER TONGUE EVERY 5 MIN IF NEEDED FOR CHEST PAIN. MAY USE 3 TIMES.NORELIEF CALL 911.  25 tablet 3   No current facility-administered medications for this visit.     Past Surgical History:  Procedure Laterality Date   COLONOSCOPY     COLONOSCOPY     CORONARY ANGIOPLASTY  2010   Dr Angel [name]   CYSTOSCOPY WITH LITHOLAPAXY N/A 02/24/2017   Procedure: CYSTOSCOPY WITH LITHOLAPAXY;  Surgeon: Angel Gauze, MD;  Location: [facility];  Service: Urology;  Laterality: N/A;   CYSTOSCOPY WITH URETHRAL DILATATION  02/24/2017   Procedure: CYSTOSCOPY WITH URETHRAL DILATATION;  Surgeon: Angel Gauze, MD;  Location: [facility];  Service: Urology;;   DOPPLER ECHOCARDIOGRAPHY     EAR CYST EXCISION Left 07/23/2012   Procedure: EXCISION LEFT EAR LOBE;  Surgeon: Angel Obey, MD;  Location[facility];  Service: ENT;  Laterality: Left;   HOLMIUM LASER APPLICATION N/A 02/24/2017   Procedure: HOLMIUM LASER APPLICATION OF BLADDER CALCULI;  Surgeon: Angel Gauze, MD;  Location: [facility];  Service: Urology;  Laterality: N/A;   IR RADIOLOGIST EVAL & MGMT  11/28/2016      Allergies  Allergen Reactions   Ciprofloxacin Other (See Comments)   Crestor [Rosuvastatin] Other (See Comments)    Unknown    Penicillins Other (See Comments)    Per patient causes gi upset with high doses. He says he does OK with shots but gets GI upset with PO penicillins Can take small doses with no issues   Tramadol Other (See Comments)    Confusion   Lisinopril Rash and Cough    Patient says it caused bp to increase and caused a rash on his legs    Macrodantin [Nitrofurantoin] Rash      No family history on file.   Social History Angel [name] reports that he quit smoking about 13 years ago. His smoking use included cigarettes. He started smoking about 58 years ago. He has a 67.5 pack-year smoking history. He has never used smokeless tobacco. Angel [name] reports no history of alcohol use.   Review of Systems CONSTITUTIONAL: No weight loss, fever, chills, weakness or fatigue.  HEENT: Eyes: No visual loss, blurred vision, double vision or yellow sclerae.No hearing loss, sneezing, congestion, runny nose or sore throat.  SKIN: No rash or itching.  CARDIOVASCULAR: per hpi RESPIRATORY:per hpi.  GASTROINTESTINAL: No anorexia, nausea, vomiting or diarrhea. No abdominal pain or blood.  GENITOURINARY: No burning on urination, no polyuria NEUROLOGICAL: No headache, dizziness, syncope, paralysis, ataxia, numbness or tingling in the extremities. No change in bowel or bladder control.  MUSCULOSKELETAL: No muscle, back pain, joint pain or stiffness.  LYMPHATICS: No enlarged nodes. No history of splenectomy.  PSYCHIATRIC: No history of depression or anxiety.  ENDOCRINOLOGIC: No reports of sweating, cold or heat intolerance. No polyuria or polydipsia.  Marland Kitchen   Physical Examination Today's Vitals   04/03/23 1107  BP: 122/72  Pulse: 70  SpO2: 92%  Weight: 137 lb 9.6 oz (62.4 kg)  Height: 5\' 10"  (1.778 m)   Body mass index is 19.74 kg/m.  Gen: resting comfortably, no acute  distress HEENT: no scleral icterus, pupils equal round and reactive, no palptable cervical adenopathy,  CV: RRR, no m/r,g no jvd Resp: Clear to auscultation bilaterally GI: abdomen is soft, non-tender, non-distended, normal bowel sounds, no hepatosplenomegaly MSK: extremities are warm, no edema.  Skin: warm, no rash Neuro:  no focal deficits Psych: appropriate affect   Diagnostic Studies  01/2017 echo Study Conclusions   - Left ventricle: The cavity size was normal. Wall thickness was   increased in  a pattern of mild LVH. Systolic function was normal.   The estimated ejection fraction was in the range of 60% to 65%.   Wall motion was normal; there were no regional wall motion   abnormalities. Doppler parameters are consistent with abnormal   left ventricular relaxation (grade 1 diastolic dysfunction). - Aortic valve: There was mild regurgitation. - Mitral valve: There was mild regurgitation. - Tricuspid valve: There was moderate regurgitation. - Pulmonary arteries: PA peak pressure: 38 mm Hg (S).     Assessment and Plan   1. CAD - no symptoms, He has turned down statin, remains on ASA     2. 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 Patient turned down OSA eval - primary management would be treatment for his chronic lung disease and hypoxia, no clear role for pulmonary vasodilators.  - pulm note mentions he had turned down RHC. I think unlikely if he agreed that RHC would significantly change management in the setting of group III pulmonary HTN.      Antoine Poche, M.D.

## 2023-04-04 ENCOUNTER — Ambulatory Visit: Payer: Medicare HMO | Admitting: Internal Medicine

## 2023-04-15 ENCOUNTER — Encounter (HOSPITAL_BASED_OUTPATIENT_CLINIC_OR_DEPARTMENT_OTHER): Payer: 59 | Admitting: Pulmonary Disease

## 2023-04-30 ENCOUNTER — Encounter: Payer: Self-pay | Admitting: Internal Medicine

## 2023-05-22 ENCOUNTER — Ambulatory Visit: Payer: 59 | Admitting: Internal Medicine

## 2023-05-24 ENCOUNTER — Emergency Department (HOSPITAL_COMMUNITY): Payer: 59

## 2023-05-24 ENCOUNTER — Inpatient Hospital Stay (HOSPITAL_COMMUNITY)
Admission: EM | Admit: 2023-05-24 | Discharge: 2023-05-31 | DRG: 291 | Disposition: A | Discharge status: Home Health | Payer: 59 | Attending: Internal Medicine | Admitting: Internal Medicine

## 2023-05-24 ENCOUNTER — Encounter (HOSPITAL_COMMUNITY): Payer: Self-pay

## 2023-05-24 ENCOUNTER — Other Ambulatory Visit: Payer: Self-pay

## 2023-05-24 DIAGNOSIS — L89329 Pressure ulcer of left buttock, unspecified stage: Secondary | ICD-10-CM | POA: Diagnosis not present

## 2023-05-24 DIAGNOSIS — Y846 Urinary catheterization as the cause of abnormal reaction of the patient, or of later complication, without mention of misadventure at the time of the procedure: Secondary | ICD-10-CM | POA: Diagnosis not present

## 2023-05-24 DIAGNOSIS — N2889 Other specified disorders of kidney and ureter: Secondary | ICD-10-CM | POA: Diagnosis present

## 2023-05-24 DIAGNOSIS — R310 Gross hematuria: Secondary | ICD-10-CM | POA: Diagnosis not present

## 2023-05-24 DIAGNOSIS — Z955 Presence of coronary angioplasty implant and graft: Secondary | ICD-10-CM

## 2023-05-24 DIAGNOSIS — Z8679 Personal history of other diseases of the circulatory system: Secondary | ICD-10-CM | POA: Diagnosis not present

## 2023-05-24 DIAGNOSIS — I2510 Atherosclerotic heart disease of native coronary artery without angina pectoris: Secondary | ICD-10-CM | POA: Diagnosis present

## 2023-05-24 DIAGNOSIS — Z880 Allergy status to penicillin: Secondary | ICD-10-CM

## 2023-05-24 DIAGNOSIS — Z881 Allergy status to other antibiotic agents status: Secondary | ICD-10-CM

## 2023-05-24 DIAGNOSIS — M109 Gout, unspecified: Secondary | ICD-10-CM | POA: Diagnosis present

## 2023-05-24 DIAGNOSIS — L89319 Pressure ulcer of right buttock, unspecified stage: Secondary | ICD-10-CM | POA: Diagnosis not present

## 2023-05-24 DIAGNOSIS — Z87891 Personal history of nicotine dependence: Secondary | ICD-10-CM

## 2023-05-24 DIAGNOSIS — R54 Age-related physical debility: Secondary | ICD-10-CM | POA: Diagnosis present

## 2023-05-24 DIAGNOSIS — I509 Heart failure, unspecified: Principal | ICD-10-CM

## 2023-05-24 DIAGNOSIS — Z79899 Other long term (current) drug therapy: Secondary | ICD-10-CM

## 2023-05-24 DIAGNOSIS — R338 Other retention of urine: Secondary | ICD-10-CM | POA: Diagnosis present

## 2023-05-24 DIAGNOSIS — Z87442 Personal history of urinary calculi: Secondary | ICD-10-CM

## 2023-05-24 DIAGNOSIS — Z7982 Long term (current) use of aspirin: Secondary | ICD-10-CM

## 2023-05-24 DIAGNOSIS — I272 Pulmonary hypertension, unspecified: Secondary | ICD-10-CM

## 2023-05-24 DIAGNOSIS — C649 Malignant neoplasm of unspecified kidney, except renal pelvis: Secondary | ICD-10-CM | POA: Diagnosis not present

## 2023-05-24 DIAGNOSIS — I3139 Other pericardial effusion (noninflammatory): Secondary | ICD-10-CM | POA: Diagnosis present

## 2023-05-24 DIAGNOSIS — D696 Thrombocytopenia, unspecified: Secondary | ICD-10-CM | POA: Diagnosis present

## 2023-05-24 DIAGNOSIS — E785 Hyperlipidemia, unspecified: Secondary | ICD-10-CM | POA: Diagnosis present

## 2023-05-24 DIAGNOSIS — I5033 Acute on chronic diastolic (congestive) heart failure: Secondary | ICD-10-CM

## 2023-05-24 DIAGNOSIS — Z8701 Personal history of pneumonia (recurrent): Secondary | ICD-10-CM

## 2023-05-24 DIAGNOSIS — J9621 Acute and chronic respiratory failure with hypoxia: Secondary | ICD-10-CM | POA: Diagnosis present

## 2023-05-24 DIAGNOSIS — C641 Malignant neoplasm of right kidney, except renal pelvis: Secondary | ICD-10-CM | POA: Diagnosis present

## 2023-05-24 DIAGNOSIS — I959 Hypotension, unspecified: Secondary | ICD-10-CM | POA: Diagnosis not present

## 2023-05-24 DIAGNOSIS — Z515 Encounter for palliative care: Secondary | ICD-10-CM | POA: Diagnosis not present

## 2023-05-24 DIAGNOSIS — N179 Acute kidney failure, unspecified: Secondary | ICD-10-CM | POA: Diagnosis not present

## 2023-05-24 DIAGNOSIS — I252 Old myocardial infarction: Secondary | ICD-10-CM | POA: Diagnosis not present

## 2023-05-24 DIAGNOSIS — M7989 Other specified soft tissue disorders: Secondary | ICD-10-CM | POA: Diagnosis present

## 2023-05-24 DIAGNOSIS — J9601 Acute respiratory failure with hypoxia: Secondary | ICD-10-CM

## 2023-05-24 DIAGNOSIS — N401 Enlarged prostate with lower urinary tract symptoms: Secondary | ICD-10-CM | POA: Diagnosis present

## 2023-05-24 DIAGNOSIS — R7989 Other specified abnormal findings of blood chemistry: Secondary | ICD-10-CM | POA: Insufficient documentation

## 2023-05-24 DIAGNOSIS — K219 Gastro-esophageal reflux disease without esophagitis: Secondary | ICD-10-CM | POA: Diagnosis present

## 2023-05-24 DIAGNOSIS — I2729 Other secondary pulmonary hypertension: Secondary | ICD-10-CM | POA: Diagnosis present

## 2023-05-24 DIAGNOSIS — Z9981 Dependence on supplemental oxygen: Secondary | ICD-10-CM | POA: Diagnosis not present

## 2023-05-24 DIAGNOSIS — D630 Anemia in neoplastic disease: Secondary | ICD-10-CM | POA: Diagnosis present

## 2023-05-24 DIAGNOSIS — Z883 Allergy status to other anti-infective agents status: Secondary | ICD-10-CM

## 2023-05-24 DIAGNOSIS — I5031 Acute diastolic (congestive) heart failure: Secondary | ICD-10-CM | POA: Diagnosis not present

## 2023-05-24 DIAGNOSIS — Z5320 Procedure and treatment not carried out because of patient's decision for unspecified reasons: Secondary | ICD-10-CM | POA: Diagnosis not present

## 2023-05-24 DIAGNOSIS — J84112 Idiopathic pulmonary fibrosis: Secondary | ICD-10-CM | POA: Diagnosis present

## 2023-05-24 DIAGNOSIS — J810 Acute pulmonary edema: Secondary | ICD-10-CM

## 2023-05-24 DIAGNOSIS — R339 Retention of urine, unspecified: Secondary | ICD-10-CM | POA: Diagnosis not present

## 2023-05-24 DIAGNOSIS — Z85828 Personal history of other malignant neoplasm of skin: Secondary | ICD-10-CM

## 2023-05-24 DIAGNOSIS — Z87898 Personal history of other specified conditions: Secondary | ICD-10-CM | POA: Diagnosis not present

## 2023-05-24 DIAGNOSIS — R918 Other nonspecific abnormal finding of lung field: Secondary | ICD-10-CM | POA: Diagnosis present

## 2023-05-24 DIAGNOSIS — Z888 Allergy status to other drugs, medicaments and biological substances status: Secondary | ICD-10-CM

## 2023-05-24 DIAGNOSIS — I110 Hypertensive heart disease with heart failure: Secondary | ICD-10-CM | POA: Diagnosis present

## 2023-05-24 DIAGNOSIS — Z7189 Other specified counseling: Secondary | ICD-10-CM | POA: Diagnosis not present

## 2023-05-24 LAB — CBC WITH DIFFERENTIAL/PLATELET
Abs Immature Granulocytes: 0.02 10*3/uL (ref 0.00–0.07)
Basophils Absolute: 0 10*3/uL (ref 0.0–0.1)
Basophils Relative: 1 %
Eosinophils Absolute: 0.1 10*3/uL (ref 0.0–0.5)
Eosinophils Relative: 2 %
HCT: 53.2 % — ABNORMAL HIGH (ref 39.0–52.0)
Hemoglobin: 17.9 g/dL — ABNORMAL HIGH (ref 13.0–17.0)
Immature Granulocytes: 0 %
Lymphocytes Relative: 23 %
Lymphs Abs: 1.4 10*3/uL (ref 0.7–4.0)
MCH: 33.5 pg (ref 26.0–34.0)
MCHC: 33.6 g/dL (ref 30.0–36.0)
MCV: 99.4 fL (ref 80.0–100.0)
Monocytes Absolute: 0.5 10*3/uL (ref 0.1–1.0)
Monocytes Relative: 8 %
Neutro Abs: 4.1 10*3/uL (ref 1.7–7.7)
Neutrophils Relative %: 66 %
Platelets: 158 10*3/uL (ref 150–400)
RBC: 5.35 MIL/uL (ref 4.22–5.81)
RDW: 15.1 % (ref 11.5–15.5)
WBC: 6.2 10*3/uL (ref 4.0–10.5)
nRBC: 0 % (ref 0.0–0.2)

## 2023-05-24 LAB — TROPONIN I (HIGH SENSITIVITY): Troponin I (High Sensitivity): 17 ng/L (ref <18)

## 2023-05-24 LAB — COMPREHENSIVE METABOLIC PANEL
ALT: 10 U/L (ref 0–44)
AST: 15 U/L (ref 15–41)
Albumin: 3.5 g/dL (ref 3.5–5.0)
Alkaline Phosphatase: 89 U/L (ref 38–126)
Anion gap: 5 (ref 5–15)
BUN: 20 mg/dL (ref 8–23)
CO2: 23 mmol/L (ref 22–32)
Calcium: 8.8 mg/dL — ABNORMAL LOW (ref 8.9–10.3)
Chloride: 107 mmol/L (ref 98–111)
Creatinine, Ser: 1.24 mg/dL (ref 0.61–1.24)
GFR, Estimated: 58 mL/min — ABNORMAL LOW (ref >60)
Glucose, Bld: 97 mg/dL (ref 70–99)
Potassium: 4 mmol/L (ref 3.5–5.1)
Sodium: 135 mmol/L (ref 135–145)
Total Bilirubin: 1.8 mg/dL — ABNORMAL HIGH (ref 0.0–1.2)
Total Protein: 7 g/dL (ref 6.5–8.1)

## 2023-05-24 LAB — BRAIN NATRIURETIC PEPTIDE: B Natriuretic Peptide: 1256 pg/mL — ABNORMAL HIGH (ref 0.0–100.0)

## 2023-05-24 MED ORDER — ALBUTEROL SULFATE (2.5 MG/3ML) 0.083% IN NEBU: 10.0000 mg | INHALATION_SOLUTION | RESPIRATORY_TRACT | Status: AC

## 2023-05-24 MED ORDER — FUROSEMIDE 10 MG/ML IJ SOLN
40.0000 mg | INTRAMUSCULAR | Status: AC
  Administered 2023-05-24: 40 mg via INTRAVENOUS
  Filled 2023-05-24: qty 4

## 2023-05-24 NOTE — ED Provider Notes (Signed)
 [place] EMERGENCY DEPARTMENT AT [facility] Provider Note   CSN: [id number] Arrival date & time: 05/24/23  2211     History  Chief Complaint  Patient presents with   Leg Swelling    Angel [name] is a 83 y.o. male.  HPI   83 year old man, history of severe pulmonary hypertension as previously documented by pulmonology, echocardiogram had revealed a normal ejection fraction with grade 1 diastolic dysfunction recently as well.  He was most recently seen by cardiology on April 03, 2023, history of a stent back in 2011 to his right coronary.  Had been on Norvasc  in the past that caused swelling and dizziness, he was not requiring blood pressure medications at that time.  It was noted that he had shortness of breath and pulmonary hypertension, echocardiogram was October 2024, there is a possibility he may have some interstitial lung disease and the patient was not on any diuretic therapy at that visit.  He reports that he has had some increasing and severe shortness of breath that has occurred over the last few days, he became very dyspneic tonight and when the paramedics arrived they found his oxygen to be 72%.  He reports he does have home oxygen which he uses 2 to 3 L by nasal cannula as needed.  He denies being on a diuretic, no fevers or chills, no coughing  Home Medications Prior to Admission medications   Medication Sig Start Date End Date Taking? Authorizing Provider  albuterol  (VENTOLIN  HFA) 108 (90 Base) MCG/ACT inhaler Inhale 2 puffs into the lungs every 6 (six) hours as needed for wheezing or shortness of breath. Patient not taking: Reported on 03/05/2023 05/16/21   Stuart Vernell Norris, PA-C  aspirin  EC 325 MG tablet Take 325 mg by mouth daily. Patient not taking: Reported on 04/03/2023    [provider]  aspirin  EC 81 MG tablet Take 81 mg by mouth daily. Patient not taking: Reported on 04/03/2023    [provider]  ibuprofen  (ADVIL ) 200 MG  tablet Take 200 mg by mouth daily as needed for moderate pain (pain score 4-6).    [provider]  nitroGLYCERIN  (NITROSTAT ) 0.4 MG SL tablet Place 1 tablet (0.4 mg total) under the tongue every 5 (five) minutes x 3 doses as needed for chest pain (if after 3rd dose no relief proceed to ED or call 911). 04/03/23   Alvan Dorn FALCON, MD      Allergies    Ciprofloxacin , Crestor [rosuvastatin], Penicillins, Tramadol , Lisinopril , and Macrodantin  [nitrofurantoin ]    Review of Systems   Review of Systems  All other systems reviewed and are negative.   Physical Exam Updated Vital Signs BP 136/87   Pulse 74   Temp (!) 96.4 F (35.8 C) (Rectal)   Resp 15   Ht 1.778 m (5' 10)   Wt 62 kg   SpO2 93%   BMI 19.61 kg/m  Physical Exam Vitals and nursing note reviewed.  Constitutional:      General: He is not in acute distress.    Appearance: He is well-developed.  HENT:     Head: Normocephalic and atraumatic.     Mouth/Throat:     Pharynx: No oropharyngeal exudate.  Eyes:     General: No scleral icterus.       Right eye: No discharge.        Left eye: No discharge.     Conjunctiva/sclera: Conjunctivae normal.     Pupils: Pupils are equal, round,  and reactive to light.  Neck:     Thyroid: No thyromegaly.     Vascular: No JVD.  Cardiovascular:     Rate and Rhythm: Regular rhythm. Tachycardia present.     Heart sounds: Normal heart sounds. No murmur heard.    No friction rub. No gallop.  Pulmonary:     Effort: Respiratory distress present.     Breath sounds: Wheezing and rales present.  Abdominal:     General: Bowel sounds are normal. There is no distension.     Palpations: Abdomen is soft. There is no mass.     Tenderness: There is no abdominal tenderness.  Musculoskeletal:        General: No tenderness. Normal range of motion.     Cervical back: Normal range of motion and neck supple.     Right lower leg: Edema present.     Left lower leg: Edema present.      Comments: Edema spreading to the proximal thighs, it is not on the abdominal wall, severe edema bilaterally  Lymphadenopathy:     Cervical: No cervical adenopathy.  Skin:    General: Skin is warm and dry.     Findings: No erythema or rash.  Neurological:     Mental Status: He is alert.     Coordination: Coordination normal.  Psychiatric:        Behavior: Behavior normal.     ED Results / Procedures / Treatments   Labs (all labs ordered are listed, but only abnormal results are displayed) Labs Reviewed  BRAIN NATRIURETIC PEPTIDE - Abnormal; Notable for the following components:      Result Value   B Natriuretic Peptide 1,256.0 (*)    All other components within normal limits  CBC WITH DIFFERENTIAL/PLATELET - Abnormal; Notable for the following components:   Hemoglobin 17.9 (*)    HCT 53.2 (*)    All other components within normal limits  COMPREHENSIVE METABOLIC PANEL - Abnormal; Notable for the following components:   Calcium  8.8 (*)    Total Bilirubin 1.8 (*)    GFR, Estimated 58 (*)    All other components within normal limits  TROPONIN I (HIGH SENSITIVITY)    EKG EKG Interpretation Date/Time:  Saturday May 24 2023 22:24:20 EST Ventricular Rate:  89 PR Interval:  147 QRS Duration:  111 QT Interval:  404 QTC Calculation: 492 R Axis:   149  Text Interpretation: Sinus rhythm Probable RVH w/ secondary repol abnormality Borderline prolonged QT interval similar to 04/03/23 Confirmed by Cleotilde Rogue ([id number]) on 05/24/2023 10:35:47 PM  Radiology DG Chest Port 1 View Result Date: 05/24/2023 CLINICAL DATA:  Shortness of breath EXAM: PORTABLE CHEST 1 VIEW COMPARISON:  07/02/2022 FINDINGS: Mild left basilar atelectasis. Mild right basilar scarring, although superimposed pneumonia is possible. No pleural effusion or pneumothorax. Heart is top-normal in size. IMPRESSION: Mild right basilar scarring, superimposed pneumonia not excluded. Electronically Signed   By: Pinkie Pebbles M.D.   On: 05/24/2023 23:12    Procedures .Critical Care  Performed by: Cleotilde Rogue, MD Authorized by: Cleotilde Rogue, MD   Critical care provider statement:    Critical care time (minutes):  45   Critical care time was exclusive of:  Separately billable procedures and treating other patients and teaching time   Critical care was necessary to treat or prevent imminent or life-threatening deterioration of the following conditions:  Respiratory failure and cardiac failure   Critical care was time spent personally by me on the following activities:  Development of treatment plan with patient or surrogate, discussions with consultants, evaluation of patient's response to treatment, examination of patient, obtaining history from patient or surrogate, review of old charts, re-evaluation of patient's condition, pulse oximetry, ordering and review of radiographic studies, ordering and review of laboratory studies and ordering and performing treatments and interventions   I assumed direction of critical care for this patient from another provider in my specialty: no     Care discussed with: admitting provider   Comments:           Medications Ordered in ED Medications  albuterol  (PROVENTIL ) (2.5 MG/3ML) 0.083% nebulizer solution 10 mg (has no administration in time range)  furosemide  (LASIX ) injection 40 mg (40 mg Intravenous Given 05/24/23 2247)    ED Course/ Medical Decision Making/ A&P                                 Medical Decision Making Amount and/or Complexity of Data Reviewed Labs: ordered. Radiology: ordered.  Risk Prescription drug management. Decision regarding hospitalization.    This patient presents to the ED for concern of severe shortness of breath, this involves an extensive number of treatment options, and is a complaint that carries with it a high risk of complications and morbidity.  The differential diagnosis includes worsening pulmonary hypertension,  pneumonia, he does have a history of bilateral pneumonia in the past, less likely to be pulmonary embolism, could be congestive heart failure specially given the peripheral edema, the edema is symmetrical.   Co morbidities that complicate the patient evaluation  Severe pulmonary hypertension   Additional history obtained:  Additional history obtained from medical record External records from outside source obtained and reviewed including prior office notes from both cardiology and pulmonology   Lab Tests:  I Ordered, and personally interpreted labs.  The pertinent results include: CBC with hyper concentration with a hemoglobin of 17.9, this is similar to prior values going back a couple years.  No leukocytosis, BNP of 1256 consistent with congestive heart failure.  Troponin is only 17 suggesting that it is not ischemic.  Metabolic panel shows no renal dysfunction of any concern, similar to prior values   Imaging Studies ordered:  I ordered imaging studies including chest x-ray I independently visualized and interpreted imaging which showed scarring in the lung, possible superimposed pneumonia cannot be excluded I agree with the radiologist interpretation   Cardiac Monitoring: / EKG:  The patient was maintained on a cardiac monitor.  I personally viewed and interpreted the cardiac monitored which showed an underlying rhythm of: Sinus rhythm   Consultations Obtained:  I requested consultation with the hospitalist,  and discussed lab and imaging findings as well as pertinent plan - they recommend: Admission to higher level of care   Problem List / ED Course / Critical interventions / Medication management  I suspect that this patient has new onset worsening congestive heart failure.  The clinical picture here is somebody that has new onset severe anemia, hypoxia and a BNP that is consistent with worsening heart failure.  He will need to be admitted to the hospital, he has been  given Lasix , I do not think he has a pneumonia he has been afebrile and has no leukocytosis. I ordered medication including furosemide  for edema, increasing flow oxygenation with nonrebreather Reevaluation of the patient after these medicines showed that the patient critically ill I have reviewed the patients home medicines and  have made adjustments as needed   Social Determinants of Health:  Prior smoker, known severe pulmonary hypertension   Test / Admission - Considered:  Admit to higher level of care         Final Clinical Impression(s) / ED Diagnoses Final diagnoses:  Acute congestive heart failure, unspecified heart failure type (HCC)  Pulmonary hypertension (HCC)  Acute pulmonary edema (HCC)  Acute respiratory failure with hypoxia (HCC)     Cleotilde Rogue, MD 05/24/23 2317

## 2023-05-24 NOTE — ED Triage Notes (Signed)
 Pt BIB [facility] for significant swelling in bilat legs. Pt also unable to maintain O2 sats about 84 according to EMS. Pt satting 72 on 2L at home upon EMS arrival. Pt currently on 3 L NRB mask satting 93. Pt unsure what Lasix  is and does not thinks he takes it. Pt lives alone.

## 2023-05-24 NOTE — H&P (Signed)
 History and Physical    Patient: Angel [name] FMW:[id number] DOB: [birth year] DOA: 05/24/2023 DOS: the patient was seen and examined on 05/25/2023 PCP: [name], MD  Patient coming from: Home  Chief Complaint:  Chief Complaint  Patient presents with   Leg Swelling   HPI: Angel [name] is a 83 y.o. male with medical history significant of pulmonary hypertension, hyperlipidemia, CAD, chronic respiratory failure with hypoxia on 2-3 LPM of oxygen at baseline who presents to the emergency department due to few days of onset of increasing severe shortness of breath.  Shortness of breath was worse tonight, so he activated EMS, on arrival of EMS team, O2 sat was noted to be at 72% on room air. He complained of bilateral leg swelling up to the thighs bilaterally.   Patient denies chest pain, chills, nausea, vomiting, coughing.  ED Course:  In the emergency department, temperature was 96.5, other vital signs were within normal range.  Workup in the ED showed normocytic anemia, normal BMP, troponin x 1 was 17, BNP 1, 256 Chest x-ray showed mild right basilar scarring, superimposed pneumonia not excluded IV Lasix  40 mg x 1 was given with an output of 400 mL of urine.  Hospitalist was asked to admit patient for further evaluation and management.  Review of Systems: Review of systems as noted in the HPI. All other systems reviewed and are negative.   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   Varicose veins of leg with swelling     right leg   Past Surgical History:  Procedure Laterality Date   COLONOSCOPY     COLONOSCOPY     CORONARY ANGIOPLASTY  2010   Dr Delford   CYSTOSCOPY WITH LITHOLAPAXY N/A 02/24/2017   Procedure: CYSTOSCOPY WITH LITHOLAPAXY;  Surgeon: Sherrilee Belvie CROME, MD;  Location: [facility];  Service: Urology;  Laterality: N/A;   CYSTOSCOPY WITH URETHRAL DILATATION  02/24/2017   Procedure: CYSTOSCOPY WITH URETHRAL DILATATION;  Surgeon: Sherrilee Belvie CROME, MD;  Location: [facility];  Service: Urology;;   DOPPLER ECHOCARDIOGRAPHY     EAR CYST EXCISION Left 07/23/2012   Procedure: EXCISION LEFT EAR LOBE;  Surgeon: Norleen Notice, MD;  Location: [facility];  Service: ENT;  Laterality: Left;   HOLMIUM LASER APPLICATION N/A 02/24/2017   Procedure: HOLMIUM LASER APPLICATION OF BLADDER CALCULI;  Surgeon: Sherrilee Belvie CROME, MD;  Location: [facility];  Service: Urology;  Laterality: N/A;   IR RADIOLOGIST EVAL & MGMT  11/28/2016    Social History:  reports that he quit smoking about 13 years ago. His smoking use included cigarettes. He started smoking about 58 years ago. He has a 67.5 pack-year smoking history. He has never used smokeless tobacco. He reports that he does not drink alcohol and does not use drugs.   Allergies  Allergen Reactions   Ciprofloxacin  Other (  See Comments)   Crestor [Rosuvastatin] Other (See Comments)    Unknown    Penicillins Other (See Comments)    Per patient causes gi upset with high doses. He says he does OK with shots but gets GI upset with PO penicillins Can take small doses with no issues   Tramadol  Other (See Comments)    Confusion   Lisinopril  Rash and Cough    Patient says it caused bp to increase and caused a rash on his legs    Macrodantin  [Nitrofurantoin ] Rash    History reviewed. No pertinent family history.   Prior to Admission medications   Medication Sig Start Date End Date Taking? Authorizing Provider  albuterol  (VENTOLIN  HFA) 108 (90 Base) MCG/ACT inhaler Inhale 2 puffs  into the lungs every 6 (six) hours as needed for wheezing or shortness of breath. Patient not taking: Reported on 03/05/2023 05/16/21   Angel Vernell Norris, PA-C  aspirin  EC 325 MG tablet Take 325 mg by mouth daily. Patient not taking: Reported on 04/03/2023    [provider]  aspirin  EC 81 MG tablet Take 81 mg by mouth daily. Patient not taking: Reported on 04/03/2023    [provider]  ibuprofen  (ADVIL ) 200 MG tablet Take 200 mg by mouth daily as needed for moderate pain (pain score 4-6).    [provider]  nitroGLYCERIN  (NITROSTAT ) 0.4 MG SL tablet Place 1 tablet (0.4 mg total) under the tongue every 5 (five) minutes x 3 doses as needed for chest pain (if after 3rd dose no relief proceed to ED or call 911). 04/03/23   Angel Dorn FALCON, MD    Physical Exam: BP 115/69 (BP Location: Right Arm)   Pulse 81   Temp (!) 97.3 F (36.3 C) (Axillary)   Resp 20   Ht 5' 10 (1.778 m)   Wt 62 kg   SpO2 95%   BMI 19.61 kg/m   General: 83 y.o. year-old male well developed well nourished in no acute distress.  Alert and oriented x3. HEENT: NCAT, EOMI Neck: Supple, trachea medial Cardiovascular: Regular rate and rhythm with no rubs or gallops.  No thyromegaly or JVD noted.  Bilateral lower extremity edema. 2/4 pulses in all 4 extremities. Respiratory: Clear to auscultation with no wheezes or rales.  Abdomen: Soft, nontender nondistended with normal bowel sounds x4 quadrants. Muskuloskeletal: No cyanosis, clubbing or edema noted bilaterally Neuro: CN II-XII intact, strength 5/5 x 4, sensation, reflexes intact Skin: No ulcerative lesions noted or rashes Psychiatry: Judgement and insight appear normal. Mood is appropriate for condition and setting          Labs on Admission:  Basic Metabolic Panel: Recent Labs  Lab 05/24/23 2232  NA 135  K 4.0  CL 107  CO2 23  GLUCOSE 97  BUN 20  CREATININE 1.24  CALCIUM  8.8*   Liver Function Tests: Recent Labs  Lab  05/24/23 2232  AST 15  ALT 10  ALKPHOS 89  BILITOT 1.8*  PROT 7.0  ALBUMIN  3.5   No results for input(s): LIPASE, AMYLASE in the last 168 hours. No results for input(s): AMMONIA in the last 168 hours. CBC: Recent Labs  Lab 05/24/23 2232  WBC 6.2  NEUTROABS 4.1  HGB 17.9*  HCT 53.2*  MCV 99.4  PLT 158   Cardiac Enzymes: No results for input(s): CKTOTAL, CKMB, CKMBINDEX, TROPONINI in the last 168 hours.  BNP (last 3 results) Recent Labs    05/24/23 2232  BNP 1,256.0*  ProBNP (last 3 results) No results for input(s): PROBNP in the last 8760 hours.  CBG: No results for input(s): GLUCAP in the last 168 hours.  Radiological Exams on Admission: DG Chest Port 1 View Result Date: 05/24/2023 CLINICAL DATA:  Shortness of breath EXAM: PORTABLE CHEST 1 VIEW COMPARISON:  07/02/2022 FINDINGS: Mild left basilar atelectasis. Mild right basilar scarring, although superimposed pneumonia is possible. No pleural effusion or pneumothorax. Heart is top-normal in size. IMPRESSION: Mild right basilar scarring, superimposed pneumonia not excluded. Electronically Signed   By: Pinkie Pebbles M.D.   On: 05/24/2023 23:12    EKG: I independently viewed the EKG done and my findings are as followed: Normal sinus rhythm at a rate of 89 bpm with borderline prolonged QTc of 492 ms  Assessment/Plan Present on Admission:  CORONARY ATHEROSCLEROSIS NATIVE CORONARY ARTERY  Right kidney mass  Principal Problem:   Acute on chronic diastolic CHF (congestive heart failure) (HCC) Active Problems:   CORONARY ATHEROSCLEROSIS NATIVE CORONARY ARTERY   Right kidney mass   Elevated brain natriuretic peptide (BNP) level   History of pulmonary hypertension   Acute on chronic respiratory failure with hypoxia (HCC)  Acute on chronic diastolic congestive heart failure Elevated BNP (1,296) Continue total input/output, daily weights and fluid restriction IV Lasix  40 mg x 1 was given;  continue IV Lasix  40 mg twice daily Continue heart healthy diet  Echocardiogram done on 02/27/2023 showed LVEF of 50 to 55%.  LV is low normal function.  Mild LVH.  G1 DD in the morning   History of pulmonary hypertension Echocardiogram done on 02/27/2023 showed an estimated RVSP of 88.3 mmHg Patient follows with Dr. Darlean with last office visit being 02/18/2023  Acute on chronic respiratory failure with hypoxia possibly due to pulm hypertension Continue supplemental oxygen per home regimen  CAD Patient has history of prostate in September 2011 to RCA Continue aspirin  per home regimen Patient was not on antihyperlipidemic due to refusal per Cardiologist's medical record Patient follows with Dr. Alvan  Right kidney mass Renal ultrasound shows 8.8 cm right renal mass in the inferior pole of right kidney which is consistent with renal cell carcinoma (03/22/2023) CT of chest done showed 1.6 cm lung mass and multiple small lung masses (02/10/23) Patient follows with Dr. Sherrilee  DVT prophylaxis: Lovenox   Code Status: Full code  Family Communication: None at bedside  Consults: None  Severity of Illness: The appropriate patient status for this patient is INPATIENT. Inpatient status is judged to be reasonable and necessary in order to provide the required intensity of service to ensure the patient's safety. The patient's presenting symptoms, physical exam findings, and initial radiographic and laboratory data in the context of their chronic comorbidities is felt to place them at high risk for further clinical deterioration. Furthermore, it is not anticipated that the patient will be medically stable for discharge from the hospital within 2 midnights of admission.   * I certify that at the point of admission it is my clinical judgment that the patient will require inpatient hospital care spanning beyond 2 midnights from the point of admission due to high intensity of service, high risk for  further deterioration and high frequency of surveillance required.*  Author: Bassel Gaskill, DO 05/25/2023 6:04 AM  For on call review [email or web address].

## 2023-05-25 ENCOUNTER — Inpatient Hospital Stay (HOSPITAL_COMMUNITY): Payer: 59

## 2023-05-25 ENCOUNTER — Other Ambulatory Visit (HOSPITAL_COMMUNITY): Payer: Self-pay | Admitting: *Deleted

## 2023-05-25 DIAGNOSIS — J9621 Acute and chronic respiratory failure with hypoxia: Secondary | ICD-10-CM | POA: Insufficient documentation

## 2023-05-25 DIAGNOSIS — I5031 Acute diastolic (congestive) heart failure: Secondary | ICD-10-CM | POA: Diagnosis not present

## 2023-05-25 DIAGNOSIS — Z8679 Personal history of other diseases of the circulatory system: Secondary | ICD-10-CM

## 2023-05-25 DIAGNOSIS — R7989 Other specified abnormal findings of blood chemistry: Secondary | ICD-10-CM | POA: Insufficient documentation

## 2023-05-25 DIAGNOSIS — I5033 Acute on chronic diastolic (congestive) heart failure: Secondary | ICD-10-CM | POA: Diagnosis not present

## 2023-05-25 LAB — COMPREHENSIVE METABOLIC PANEL
ALT: 8 U/L (ref 0–44)
AST: 16 U/L (ref 15–41)
Albumin: 3.1 g/dL — ABNORMAL LOW (ref 3.5–5.0)
Alkaline Phosphatase: 74 U/L (ref 38–126)
Anion gap: 11 (ref 5–15)
BUN: 21 mg/dL (ref 8–23)
CO2: 21 mmol/L — ABNORMAL LOW (ref 22–32)
Calcium: 8.7 mg/dL — ABNORMAL LOW (ref 8.9–10.3)
Chloride: 104 mmol/L (ref 98–111)
Creatinine, Ser: 1.21 mg/dL (ref 0.61–1.24)
GFR, Estimated: 59 mL/min — ABNORMAL LOW (ref >60)
Glucose, Bld: 89 mg/dL (ref 70–99)
Potassium: 3.5 mmol/L (ref 3.5–5.1)
Sodium: 136 mmol/L (ref 135–145)
Total Bilirubin: 2 mg/dL — ABNORMAL HIGH (ref 0.0–1.2)
Total Protein: 6.3 g/dL — ABNORMAL LOW (ref 6.5–8.1)

## 2023-05-25 LAB — MAGNESIUM: Magnesium: 1.9 mg/dL (ref 1.7–2.4)

## 2023-05-25 LAB — CBC
HCT: 48.8 % (ref 39.0–52.0)
Hemoglobin: 16.6 g/dL (ref 13.0–17.0)
MCH: 33.6 pg (ref 26.0–34.0)
MCHC: 34 g/dL (ref 30.0–36.0)
MCV: 98.8 fL (ref 80.0–100.0)
Platelets: 148 10*3/uL — ABNORMAL LOW (ref 150–400)
RBC: 4.94 MIL/uL (ref 4.22–5.81)
RDW: 14.9 % (ref 11.5–15.5)
WBC: 7.6 10*3/uL (ref 4.0–10.5)
nRBC: 0 % (ref 0.0–0.2)

## 2023-05-25 LAB — PHOSPHORUS: Phosphorus: 2.6 mg/dL (ref 2.5–4.6)

## 2023-05-25 LAB — ECHOCARDIOGRAM COMPLETE
Area-P 1/2: 4.6 cm2
Height: 70 in
P 1/2 time: 375 ms
S' Lateral: 3 cm
Weight: 2186.96 [oz_av]

## 2023-05-25 MED ORDER — FUROSEMIDE 10 MG/ML IJ SOLN
40.0000 mg | Freq: Two times a day (BID) | INTRAMUSCULAR | Status: DC
  Administered 2023-05-25 (×2): 40 mg via INTRAVENOUS
  Filled 2023-05-25 (×2): qty 4

## 2023-05-25 MED ORDER — ALUM & MAG HYDROXIDE-SIMETH 200-200-20 MG/5ML PO SUSP
30.0000 mL | Freq: Four times a day (QID) | ORAL | Status: DC | PRN
  Administered 2023-05-25 – 2023-05-28 (×2): 30 mL via ORAL
  Filled 2023-05-25 (×2): qty 30

## 2023-05-25 MED ORDER — ACETAMINOPHEN 650 MG RE SUPP: 650.0000 mg | Freq: Four times a day (QID) | RECTAL | Status: DC | PRN

## 2023-05-25 MED ORDER — POTASSIUM CHLORIDE CRYS ER 20 MEQ PO TBCR
40.0000 meq | EXTENDED_RELEASE_TABLET | ORAL | Status: AC
  Administered 2023-05-25 – 2023-05-26 (×2): 40 meq via ORAL
  Filled 2023-05-25 (×2): qty 2

## 2023-05-25 MED ORDER — IOHEXOL 350 MG/ML SOLN
75.0000 mL | Freq: Once | INTRAVENOUS | Status: AC | PRN
  Administered 2023-05-25: 75 mL via INTRAVENOUS

## 2023-05-25 MED ORDER — ALBUTEROL SULFATE (2.5 MG/3ML) 0.083% IN NEBU
2.5000 mg | INHALATION_SOLUTION | RESPIRATORY_TRACT | Status: DC | PRN
  Administered 2023-05-27: 2.5 mg via RESPIRATORY_TRACT
  Filled 2023-05-25 (×2): qty 3

## 2023-05-25 MED ORDER — ONDANSETRON HCL 4 MG/2ML IJ SOLN
4.0000 mg | Freq: Four times a day (QID) | INTRAMUSCULAR | Status: DC | PRN
  Administered 2023-05-25: 4 mg via INTRAVENOUS
  Filled 2023-05-25 (×2): qty 2

## 2023-05-25 MED ORDER — IBUPROFEN 400 MG PO TABS
200.0000 mg | ORAL_TABLET | Freq: Every day | ORAL | Status: DC | PRN
  Administered 2023-05-25: 200 mg via ORAL
  Filled 2023-05-25: qty 1

## 2023-05-25 MED ORDER — CHLORHEXIDINE GLUCONATE CLOTH 2 % EX PADS
6.0000 | MEDICATED_PAD | Freq: Every day | CUTANEOUS | Status: DC
  Administered 2023-05-25 – 2023-05-31 (×7): 6 via TOPICAL

## 2023-05-25 MED ORDER — ONDANSETRON HCL 4 MG PO TABS: 4.0000 mg | ORAL_TABLET | Freq: Four times a day (QID) | ORAL | Status: DC | PRN

## 2023-05-25 MED ORDER — ACETAMINOPHEN 325 MG PO TABS
650.0000 mg | ORAL_TABLET | Freq: Four times a day (QID) | ORAL | Status: DC | PRN
  Administered 2023-05-25 – 2023-05-31 (×6): 650 mg via ORAL
  Filled 2023-05-25 (×8): qty 2

## 2023-05-25 MED ORDER — ENOXAPARIN SODIUM 40 MG/0.4ML IJ SOSY
40.0000 mg | PREFILLED_SYRINGE | INTRAMUSCULAR | Status: DC
  Administered 2023-05-25: 40 mg via SUBCUTANEOUS
  Filled 2023-05-25 (×3): qty 0.4

## 2023-05-25 NOTE — ED Notes (Signed)
 ED TO INPATIENT HANDOFF REPORT  ED Nurse Name and Phone #: [phone]  S Name/Age/Gender Angel [name] 83 y.o. male Room/Bed: APA02/APA02  Code Status   Code Status: Prior  Home/SNF/Other Home Patient oriented to: self, place, time, and situation Is this baseline? Yes   Triage Complete: Triage complete  Chief Complaint Acute CHF (congestive heart failure) (HCC) [I50.9]  Triage Note Pt BIB [facility] for significant swelling in bilat legs. Pt also unable to maintain O2 sats about 84 according to EMS. Pt satting 72 on 2L at home upon EMS arrival. Pt currently on 3 L NRB mask satting 93. Pt unsure what Lasix  is and does not thinks he takes it. Pt lives alone.    Allergies Allergies  Allergen Reactions   Ciprofloxacin  Other (See Comments)   Crestor [Rosuvastatin] Other (See Comments)    Unknown    Penicillins Other (See Comments)    Per patient causes gi upset with high doses. He says he does OK with shots but gets GI upset with PO penicillins Can take small doses with no issues   Tramadol  Other (See Comments)    Confusion   Lisinopril  Rash and Cough    Patient says it caused bp to increase and caused a rash on his legs    Macrodantin  [Nitrofurantoin ] Rash    Level of Care/Admitting Diagnosis ED Disposition     ED Disposition  Admit   Condition  --   Comment  Hospital Area: [facility] [[id number]]  Level of Care: Telemetry [5]  Covid Evaluation: Asymptomatic - no recent exposure (last 10 days) testing not required  Diagnosis: Acute CHF (congestive heart failure) (HCC) [619320]  Admitting Physician: ADEFESO, OLADAPO [[id number]]  Attending Physician: ADEFESO, OLADAPO [[id number]]  Certification:: I certify this patient will need inpatient services for at least 2 midnights  Expected Medical Readiness: 05/27/2023          B Medical/Surgery History Past Medical History:  Diagnosis Date   Bladder stones    CAD (coronary artery disease)    BMS RCA 8/11; LVEF 50-55%    Cancer (HCC)    Deep vein thrombosis (DVT) (HCC)    Gall bladder stones    GERD (gastroesophageal reflux disease)    Gout    History of kidney stones    HLD (hyperlipidemia)    HTN (hypertension)    MI (myocardial infarction) (HCC)    MSTEMI 8/11   Pneumonia 2013   Rash, skin    on lower legs   Renal disorder    kidney growth   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   Past Surgical History:  Procedure Laterality Date   COLONOSCOPY     COLONOSCOPY     CORONARY ANGIOPLASTY  2010   Dr Delford   CYSTOSCOPY WITH LITHOLAPAXY N/A 02/24/2017   Procedure: CYSTOSCOPY WITH LITHOLAPAXY;  Surgeon: Sherrilee Belvie CROME, MD;  Location: [facility];  Service: Urology;  Laterality: N/A;   CYSTOSCOPY WITH URETHRAL DILATATION  02/24/2017   Procedure: CYSTOSCOPY WITH URETHRAL DILATATION;  Surgeon: Sherrilee Belvie CROME, MD;  Location: [facility];  Service: Urology;;   DOPPLER ECHOCARDIOGRAPHY     EAR CYST EXCISION Left 07/23/2012   Procedure: EXCISION LEFT EAR LOBE;  Surgeon: Norleen Notice, MD;  Location: [facility] OR;  Service: ENT;  Laterality: Left;   HOLMIUM LASER APPLICATION N/A 02/24/2017   Procedure: HOLMIUM LASER APPLICATION OF BLADDER CALCULI;  Surgeon: Sherrilee Belvie CROME, MD;  Location: [facility];  Service: Urology;  Laterality: N/A;   IR RADIOLOGIST EVAL & MGMT  11/28/2016     A IV Location/Drains/Wounds Patient Lines/Drains/Airways Status     Active Line/Drains/Airways     Name Placement date Placement time Site Days   Peripheral IV 05/24/23 20 G Anterior;Distal;Right;Upper Arm 05/24/23  2247  Arm  1            Intake/Output Last 24 hours No intake or output data in the 24 hours ending 05/25/23 0104  Labs/Imaging Results for orders placed or performed during the hospital encounter of 05/24/23 (from the past 48 hours)  Brain natriuretic peptide     Status: Abnormal   Collection Time: 05/24/23 10:32 PM  Result Value Ref Range    B Natriuretic Peptide 1,256.0 (H) 0.0 - 100.0 pg/mL    Comment: Performed at [facility], [address], [place], [place] [address]  CBC with Differential     Status: Abnormal   Collection Time: 05/24/23 10:32 PM  Result Value Ref Range   WBC 6.2 4.0 - 10.5 K/uL   RBC 5.35 4.22 - 5.81 MIL/uL   Hemoglobin 17.9 (H) 13.0 - 17.0 g/dL   HCT 46.7 (H) 60.9 - 47.9 %   MCV 99.4 80.0 - 100.0 fL   MCH 33.5 26.0 - 34.0 pg   MCHC 33.6 30.0 - 36.0 g/dL   RDW 84.8 88.4 - 84.4 %   Platelets 158 150 - 400 K/uL   nRBC 0.0 0.0 - 0.2 %   Neutrophils Relative % 66 %   Neutro Abs 4.1 1.7 - 7.7 K/uL   Lymphocytes Relative 23 %   Lymphs Abs 1.4 0.7 - 4.0 K/uL   Monocytes Relative 8 %   Monocytes Absolute 0.5 0.1 - 1.0 K/uL   Eosinophils Relative 2 %   Eosinophils Absolute 0.1 0.0 - 0.5 K/uL   Basophils Relative 1 %   Basophils Absolute 0.0 0.0 - 0.1 K/uL   Immature Granulocytes 0 %   Abs Immature Granulocytes 0.02 0.00 - 0.07 K/uL    Comment: Performed at [facility], [address] [place], [place] [address]  Comprehensive metabolic panel     Status: Abnormal   Collection Time: 05/24/23 10:32 PM  Result Value Ref Range   Sodium 135 135 - 145 mmol/L   Potassium 4.0 3.5 - 5.1 mmol/L   Chloride 107 98 - 111 mmol/L   CO2 23 22 - 32 mmol/L   Glucose, Bld 97 70 - 99 mg/dL    Comment: Glucose reference range applies only to samples taken after fasting for at least 8 hours.   BUN 20 8 - 23 mg/dL   Creatinine, Ser 8.75 0.61 - 1.24 mg/dL   Calcium  8.8 (L) 8.9 - 10.3 mg/dL   Total Protein 7.0 6.5 - 8.1 g/dL   Albumin  3.5 3.5 - 5.0 g/dL   AST 15 15 - 41 U/L   ALT 10 0 - 44 U/L   Alkaline Phosphatase 89 38 - 126 U/L   Total Bilirubin 1.8 (H) 0.0 - 1.2 mg/dL   GFR, Estimated 58 (L) >60 mL/min    Comment: (NOTE) Calculated using the CKD-EPI Creatinine Equation (2021)    Anion gap 5 5 - 15    Comment: Performed at [facility], [address], [place], [place] [address]  Troponin I (High  Sensitivity)     Status: None   Collection Time: 05/24/23 10:32 PM  Result Value Ref  Range   Troponin I (High Sensitivity) 17 <18 ng/L    Comment: (NOTE) Elevated high sensitivity troponin I (hsTnI) values and significant  changes across serial measurements may suggest ACS but many other  chronic and acute conditions are known to elevate hsTnI results.  Refer to the Links section for chest pain algorithms and additional  guidance. Performed at [facility], [address] [place], [place] [address]    DG Chest Port 1 View Result Date: 05/24/2023 CLINICAL DATA:  Shortness of breath EXAM: PORTABLE CHEST 1 VIEW COMPARISON:  07/02/2022 FINDINGS: Mild left basilar atelectasis. Mild right basilar scarring, although superimposed pneumonia is possible. No pleural effusion or pneumothorax. Heart is top-normal in size. IMPRESSION: Mild right basilar scarring, superimposed pneumonia not excluded. Electronically Signed   By: Pinkie Pebbles M.D.   On: 05/24/2023 23:12    Pending Labs Unresulted Labs (From admission, onward)    None       Vitals/Pain Today's Vitals   05/24/23 2340 05/24/23 2346 05/25/23 0030 05/25/23 0045  BP:  (!) 155/81 (!) 142/84 (!) 148/115  Pulse: 86 86 89 90  Resp: 15 16 (!) 22 (!) 22  Temp:      TempSrc:      SpO2: 96% 99% 96% 95%  Weight:      Height:      PainSc:        Isolation Precautions No active isolations  Medications Medications  albuterol  (PROVENTIL ) (2.5 MG/3ML) 0.083% nebulizer solution 10 mg (has no administration in time range)  furosemide  (LASIX ) injection 40 mg (40 mg Intravenous Given 05/24/23 2247)    Mobility walks     Focused Assessments Pulmonary Assessment Handoff:  Lung sounds:          R Recommendations: See Admitting Provider Note  Report given to:   Additional Notes:

## 2023-05-25 NOTE — Plan of Care (Signed)
  Problem: Activity: Goal: Risk for activity intolerance will decrease Outcome: Progressing   Problem: Pain Management: Goal: General experience of comfort will improve Outcome: Progressing   Problem: Safety: Goal: Ability to remain free from injury will improve Outcome: Progressing

## 2023-05-25 NOTE — Progress Notes (Signed)
 05/25/2023 0145 Received pt to room 307 from ED with dx of Acute CHF.  Pt is A&Ox4, some C/O lower abdominal pain, no other C/O.  Tele monitor applied and CCMD notified.  Pt currently on 5L and sats are 95%.  Oriented to room, call light and bed.  Call bell in reach, bed alarm on. Dasie Lamarr BROCKS

## 2023-05-25 NOTE — Progress Notes (Signed)
*  PRELIMINARY RESULTS* Echocardiogram 2D Echocardiogram has been performed.  Angel [name] 05/25/2023, 11:09 AM

## 2023-05-25 NOTE — Progress Notes (Signed)
 PROGRESS NOTE    Angel [name]  FMW:[id number] DOB: April 28, 1941 DOA: 05/24/2023 PCP: [name], MD  Chief Complaint  Patient presents with   Leg Swelling    Brief Narrative:     Assessment & Plan:   Principal Problem:   Acute on chronic diastolic CHF (congestive heart failure) (HCC) Active Problems:   CORONARY ATHEROSCLEROSIS NATIVE CORONARY ARTERY   Right kidney mass   Elevated brain natriuretic peptide (BNP) level   History of pulmonary hypertension   Acute on chronic respiratory failure with hypoxia (HCC)  Acute on chronic diastolic congestive heart failure At presentation, noted to have bilateral LE edema (to proximal thighs) Elevated BNP (1,296) Edema is improved today, JVD present Lasix  40 mg IV BID Continue total input/output, daily weights and fluid restriction Repeat echo pending  Echocardiogram done on 02/27/2023 showed LVEF of 50 to 55%.  LV is low normal function.  Mild LVH.  G1 DD in the morning    History of pulmonary hypertension Echocardiogram done on 02/27/2023 showed an estimated RVSP of 88.3 mmHg He declined RHC per pulm Patient follows with Dr. Darlean with last office visit being 02/18/2023   Acute on chronic respiratory failure with hypoxia Saw Dr. Darlean 02/2023, not clear to me underlying cause of chronic resp failure - possible ILD? 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 Darlean Outpatient   CAD Patient has history of prostate in September 2011 to RCA Continue aspirin  per home regimen Patient was not on antihyperlipidemic due to refusal per Cardiologist's medical record Patient follows with Dr. Alvan   Right kidney mass Renal ultrasound shows 8.8 cm right renal mass in the inferior pole of right kidney  which is consistent with renal cell carcinoma (03/22/2023) Patient follows with Dr. Sherrilee  Spiculated solid 1.6 cm posterior L upper lobe pulmonary nodule Solid 0.7 cm peripheral LUL pulmonary nodule (warrants CT chest follow up) Smaller Solid Pulmonary Nodules Scattered Throughout Both Lungs Concerning for primary bronchogenic carcinoma Sounds like no plans for PET at this time based on Dr. Chari note from 02/2023    DVT prophylaxis: lovenox  Code Status: full Family Communication: none Disposition:   Status is: Inpatient Remains inpatient appropriate because: new o2 needs   Consultants:  none  Procedures:  none  Antimicrobials:  Anti-infectives (From admission, onward)    None       Subjective: C/o having to use bathroom Denies CP Notes SOB/swelling  Objective: Vitals:   05/25/23 0045 05/25/23 0150 05/25/23 0304 05/25/23 0359  BP: (!) 148/115 117/62  115/69  Pulse: 90 83  81  Resp: (!) 22 20  20   Temp:  (!) 97.5 F (36.4 C)  (!) 97.3 F (36.3 C)  TempSrc:  Axillary  Axillary  SpO2: 95% 95% 95% 95%  Weight:      Height:        Intake/Output Summary (Last 24 hours) at 05/25/2023 1241 Last data filed at 05/25/2023 0301 Gross per 24 hour  Intake --  Output 400 ml  Net -400 ml   Filed Weights   05/24/23 2240  Weight: 62 kg    Examination:  General exam: Appears calm and comfortable  Respiratory system: diminished Cardiovascular system: RRR Gastrointestinal system: Abdomen is nondistended, soft and nontender Central nervous system: Alert and oriented. No focal neurological deficits. Extremities: no LEE  Data Reviewed: I have personally reviewed following labs and imaging studies  CBC: Recent Labs  Lab 05/24/23 2232 05/25/23 0404  WBC 6.2 7.6  NEUTROABS 4.1  --   HGB 17.9* 16.6  HCT 53.2* 48.8  MCV 99.4 98.8  PLT 158 148*    Basic Metabolic Panel: Recent Labs  Lab 05/24/23 2232 05/25/23 0404  NA 135 136  K 4.0 3.5  CL 107 104   CO2 23 21*  GLUCOSE 97 89  BUN 20 21  CREATININE 1.24 1.21  CALCIUM  8.8* 8.7*  MG  --  1.9  PHOS  --  2.6    GFR: Estimated Creatinine Clearance: 40.6 mL/min (by C-G formula based on SCr of 1.21 mg/dL).  Liver Function Tests: Recent Labs  Lab 05/24/23 2232 05/25/23 0404  AST 15 16  ALT 10 8  ALKPHOS 89 74  BILITOT 1.8* 2.0*  PROT 7.0 6.3*  ALBUMIN  3.5 3.1*    CBG: No results for input(s): GLUCAP in the last 168 hours.   No results found for this or any previous visit (from the past 240 hours).       Radiology Studies: DG Chest Port 1 View Result Date: 05/24/2023 CLINICAL DATA:  Shortness of breath EXAM: PORTABLE CHEST 1 VIEW COMPARISON:  07/02/2022 FINDINGS: Mild left basilar atelectasis. Mild right basilar scarring, although superimposed pneumonia is possible. No pleural effusion or pneumothorax. Heart is top-normal in size. IMPRESSION: Mild right basilar scarring, superimposed pneumonia not excluded. Electronically Signed   By: Pinkie Pebbles M.D.   On: 05/24/2023 23:12        Scheduled Meds:  enoxaparin  (LOVENOX ) injection  40 mg Subcutaneous Q24H   furosemide   40 mg Intravenous Q12H   Continuous Infusions:   LOS: 1 day    Time spent: over 30 min     Meliton Monte, MD [facility]   To contact the attending provider between 7A-7P or the covering provider during after hours 7P-7A, please log into the web site [email or web address] and access using universal [place] password for that web site. If you do not have the password, please call the hospital operator.  05/25/2023, 12:41 PM

## 2023-05-25 NOTE — Progress Notes (Signed)
   05/25/23 1223  TOC Brief Assessment  Insurance and Status Reviewed  Patient has primary care physician Yes  Home environment has been reviewed From Home  Prior level of function: Independent  Prior/Current Home Services No current home services  Social Drivers of Health Review SDOH reviewed no interventions necessary  Readmission risk has been reviewed Yes  Transition of care needs no transition of care needs at this time      Transition of Care Department [facility]) has reviewed patient and no TOC needs have been identified at this time. We will continue to monitor patient advancement through interdisciplinary progression rounds. If new patient transition needs arise, please place a TOC consult.

## 2023-05-25 NOTE — Plan of Care (Signed)

## 2023-05-26 DIAGNOSIS — I5033 Acute on chronic diastolic (congestive) heart failure: Secondary | ICD-10-CM | POA: Diagnosis not present

## 2023-05-26 LAB — CBC WITH DIFFERENTIAL/PLATELET
Abs Immature Granulocytes: 0.07 10*3/uL (ref 0.00–0.07)
Basophils Absolute: 0 10*3/uL (ref 0.0–0.1)
Basophils Relative: 0 %
Eosinophils Absolute: 0 10*3/uL (ref 0.0–0.5)
Eosinophils Relative: 0 %
HCT: 47.1 % (ref 39.0–52.0)
Hemoglobin: 16.1 g/dL (ref 13.0–17.0)
Immature Granulocytes: 1 %
Lymphocytes Relative: 11 %
Lymphs Abs: 1.1 10*3/uL (ref 0.7–4.0)
MCH: 33.6 pg (ref 26.0–34.0)
MCHC: 34.2 g/dL (ref 30.0–36.0)
MCV: 98.3 fL (ref 80.0–100.0)
Monocytes Absolute: 0.7 10*3/uL (ref 0.1–1.0)
Monocytes Relative: 7 %
Neutro Abs: 7.8 10*3/uL — ABNORMAL HIGH (ref 1.7–7.7)
Neutrophils Relative %: 81 %
Platelets: 128 10*3/uL — ABNORMAL LOW (ref 150–400)
RBC: 4.79 MIL/uL (ref 4.22–5.81)
RDW: 15.1 % (ref 11.5–15.5)
WBC: 9.7 10*3/uL (ref 4.0–10.5)
nRBC: 0 % (ref 0.0–0.2)

## 2023-05-26 LAB — COMPREHENSIVE METABOLIC PANEL
ALT: 8 U/L (ref 0–44)
AST: 18 U/L (ref 15–41)
Albumin: 2.9 g/dL — ABNORMAL LOW (ref 3.5–5.0)
Alkaline Phosphatase: 65 U/L (ref 38–126)
Anion gap: 9 (ref 5–15)
BUN: 26 mg/dL — ABNORMAL HIGH (ref 8–23)
CO2: 19 mmol/L — ABNORMAL LOW (ref 22–32)
Calcium: 8.8 mg/dL — ABNORMAL LOW (ref 8.9–10.3)
Chloride: 105 mmol/L (ref 98–111)
Creatinine, Ser: 1.76 mg/dL — ABNORMAL HIGH (ref 0.61–1.24)
GFR, Estimated: 38 mL/min — ABNORMAL LOW (ref >60)
Glucose, Bld: 92 mg/dL (ref 70–99)
Potassium: 4.6 mmol/L (ref 3.5–5.1)
Sodium: 133 mmol/L — ABNORMAL LOW (ref 135–145)
Total Bilirubin: 1.7 mg/dL — ABNORMAL HIGH (ref 0.0–1.2)
Total Protein: 5.9 g/dL — ABNORMAL LOW (ref 6.5–8.1)

## 2023-05-26 LAB — HEMOGLOBIN AND HEMATOCRIT, BLOOD
HCT: 49.6 % (ref 39.0–52.0)
Hemoglobin: 16.3 g/dL (ref 13.0–17.0)

## 2023-05-26 LAB — TYPE AND SCREEN
ABO/RH(D): O POS
Antibody Screen: NEGATIVE

## 2023-05-26 LAB — PHOSPHORUS: Phosphorus: 3.9 mg/dL (ref 2.5–4.6)

## 2023-05-26 LAB — MAGNESIUM: Magnesium: 2.1 mg/dL (ref 1.7–2.4)

## 2023-05-26 MED ORDER — ENOXAPARIN SODIUM 30 MG/0.3ML IJ SOSY
30.0000 mg | PREFILLED_SYRINGE | INTRAMUSCULAR | Status: DC
  Administered 2023-05-26: 30 mg via SUBCUTANEOUS

## 2023-05-26 MED ORDER — TAMSULOSIN HCL 0.4 MG PO CAPS
0.4000 mg | ORAL_CAPSULE | Freq: Every day | ORAL | Status: DC
  Administered 2023-05-26 – 2023-05-30 (×5): 0.4 mg via ORAL
  Filled 2023-05-26 (×5): qty 1

## 2023-05-26 MED ORDER — FINASTERIDE 5 MG PO TABS
5.0000 mg | ORAL_TABLET | Freq: Every day | ORAL | Status: DC
  Administered 2023-05-26 – 2023-05-31 (×6): 5 mg via ORAL
  Filled 2023-05-26 (×6): qty 1

## 2023-05-26 NOTE — Progress Notes (Signed)
 Patient's blood pressure 100/67,heart rate 79, oxygen saturation was 84 percent on 3 liters. Oxygen increased to 5 liters per nasal canula oxygen saturation at 90 percent this time. Dr Lowell Guitar notified, see new orders. Plan of care on going.

## 2023-05-26 NOTE — Plan of Care (Signed)
   Problem: Education: Goal: Knowledge of General Education information will improve Description Including pain rating scale, medication(s)/side effects and non-pharmacologic comfort measures Outcome: Progressing   Problem: Education: Goal: Knowledge of General Education information will improve Description Including pain rating scale, medication(s)/side effects and non-pharmacologic comfort measures Outcome: Progressing

## 2023-05-26 NOTE — Progress Notes (Signed)
 PROGRESS NOTE    Angel [name]  FMW:[id number] DOB: November 11, 1940 DOA: 05/24/2023 PCP: [name], MD  Chief Complaint  Patient presents with   Leg Swelling    Brief Narrative:   Angel [name] is Angel [name] 83 y.o. male with medical history significant of pulmonary hypertension, hyperlipidemia, CAD, chronic respiratory failure with hypoxia on 2-3 LPM of oxygen at baseline who presents to the emergency department due to few days of onset of increasing severe shortness of breath.  Shortness of breath was worse tonight, so he activated EMS, on arrival of EMS team, O2 sat was noted to be at 72% on room air. He complained of bilateral leg swelling up to the thighs bilaterally.   Patient denies chest pain, chills, nausea, vomiting, coughing.  He's been admitted for Angel [name] HF exacerbation.  Assessment & Plan:   Principal Problem:   Acute on chronic diastolic CHF (congestive heart failure) (HCC) Active Problems:   CORONARY ATHEROSCLEROSIS NATIVE CORONARY ARTERY   Right kidney mass   Elevated brain natriuretic peptide (BNP) level   History of pulmonary hypertension   Acute on chronic respiratory failure with hypoxia (HCC)  Acute on chronic diastolic congestive heart failure  Bilateral Pleural Effusions At presentation, noted to have bilateral LE edema (to proximal thighs) Elevated BNP (1,296) Edema is improved today, JVD present Lasix  on hold today with hypotension and aki  Continue total input/output, daily weights and fluid restriction Repeat echo pending  Echocardiogram done on 02/27/2023 showed LVEF of 50 to 55%.  LV is low normal function.  Mild LVH.  G1 DD in the morning    Acute Kidney Injury Related to diuresis, holding today, will monitor  Urinary Retention  Hematuria Hematuria I suspect related to foley catheter placement Will monitor H/H  (stable today) and hematuria, consider urology involvement Will need to d/c with foley and follow with urology for TOV  History of pulmonary  hypertension Echocardiogram done on 02/27/2023 showed an estimated RVSP of 88.3 mmHg He declined RHC per pulm Patient follows with Dr. Darlean with last office visit being 02/18/2023   Acute on chronic respiratory failure with hypoxia Saw Dr. Darlean 02/2023, not clear to me underlying cause of chronic resp failure - possible ILD? 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 Darlean Outpatient   CAD Patient has history of prostate in September 2011 to RCA Continue aspirin  per home regimen Patient was not on antihyperlipidemic due to refusal per Cardiologist's medical record Patient follows with Dr. Alvan   Right kidney mass Renal ultrasound shows 8.8 cm right renal mass in the inferior pole of right kidney which is consistent with renal cell carcinoma (03/22/2023) Patient follows with Dr. Sherrilee  Spiculated solid 1.6 cm posterior L upper lobe pulmonary nodule Solid 0.7 cm peripheral LUL pulmonary nodule (warrants CT chest follow up) Smaller Solid Pulmonary Nodules Scattered Throughout Both Lungs Concerning for primary bronchogenic carcinoma Sounds like no plans for PET at this time based on Dr. Chari note from 02/2023    DVT prophylaxis: lovenox  Code Status: full Family Communication: none Disposition:   Status is: Inpatient Remains inpatient appropriate because: new o2 needs   Consultants:  none  Procedures:  none  Antimicrobials:  Anti-infectives (From admission, onward)    None  Subjective: Feels more like himself  Objective: Vitals:   05/25/23 2000 05/26/23 0511 05/26/23 0530 05/26/23 0904  BP: 111/79  118/72 90/63  Pulse: 98  98 86   Resp: 20  (!) 22 19  Temp: (!) 97.5 F (36.4 C)   (!) 97.4 F (36.3 C)  TempSrc: Oral   Axillary  SpO2: 90%  94% 94%  Weight:  53.3 kg    Height:        Intake/Output Summary (Last 24 hours) at 05/26/2023 1039 Last data filed at 05/26/2023 0511 Gross per 24 hour  Intake 480 ml  Output 1650 ml  Net -1170 ml   Filed Weights   05/24/23 2240 05/26/23 0511  Weight: 62 kg 53.3 kg    Examination:  General: No acute distress. Cardiovascular: RRR Lungs: CTAB Neurological: Alert and oriented 3. Moves all extremities 4. Cranial nerves II through XII grossly intact. Skin: Warm and dry. No rashes or lesions. Extremities:bilateral LE edema, trace, noted up to thighs, R>L   Data Reviewed: I have personally reviewed following labs and imaging studies  CBC: Recent Labs  Lab 05/24/23 2232 05/25/23 0404 05/26/23 0505  WBC 6.2 7.6 9.7  NEUTROABS 4.1  --  7.8*  HGB 17.9* 16.6 16.1  HCT 53.2* 48.8 47.1  MCV 99.4 98.8 98.3  PLT 158 148* 128*    Basic Metabolic Panel: Recent Labs  Lab 05/24/23 2232 05/25/23 0404 05/26/23 0505  NA 135 136 133*  K 4.0 3.5 4.6  CL 107 104 105  CO2 23 21* 19*  GLUCOSE 97 89 92  BUN 20 21 26*  CREATININE 1.24 1.21 1.76*  CALCIUM  8.8* 8.7* 8.8*  MG  --  1.9 2.1  PHOS  --  2.6 3.9    GFR: Estimated Creatinine Clearance: 24 mL/min (Angel [name]) (by C-G formula based on SCr of 1.76 mg/dL (H)).  Liver Function Tests: Recent Labs  Lab 05/24/23 2232 05/25/23 0404 05/26/23 0505  AST 15 16 18   ALT 10 8 8   ALKPHOS 89 74 65  BILITOT 1.8* 2.0* 1.7*  PROT 7.0 6.3* 5.9*  ALBUMIN  3.5 3.1* 2.9*    CBG: No results for input(s): GLUCAP in the last 168 hours.   No results found for this or any previous visit (from the past 240 hours).       Radiology Studies: CT Angio Chest Pulmonary Embolism (PE) W or WO Contrast Result Date: 05/25/2023 CLINICAL DATA:  Short of breath.  * Tracking Code: BO * EXAM: CT ANGIOGRAPHY CHEST WITH CONTRAST  TECHNIQUE: Multidetector CT imaging of the chest was performed using the standard protocol during bolus administration of intravenous contrast. Multiplanar CT image reconstructions and MIPs were obtained to evaluate the vascular anatomy. RADIATION DOSE REDUCTION: This exam was performed according to the departmental dose-optimization program which includes automated exposure control, adjustment of the mA and/or kV according to patient size and/or use of iterative reconstruction technique. CONTRAST:  75mL OMNIPAQUE  IOHEXOL  350 MG/ML SOLN COMPARISON:  None Available. FINDINGS: Cardiovascular: No filling defects within the pulmonary arteries to suggest acute pulmonary embolism. Mediastinum/Nodes: No axillary or supraclavicular adenopathy. No mediastinal or hilar adenopathy. No pericardial fluid. Esophagus normal. Lungs/Pleura: Persistent spiculated LEFT upper lobe pulmonary nodule measures 14 mm 12 mm (image 39/series 6) compared to 16 mm x 12 mm on prior. Visually lesion appears very similar. Smaller LEFT upper lobe nodule measuring 6 mm (image 45) is unchanged. Several scattered subcentimeter RIGHT lung nodules are unchanged. New layering small to moderate sized RIGHT pleural effusion.  Small LEFT pleural effusion. Upper Abdomen: Limited view of the liver, kidneys, pancreas are unremarkable. Normal adrenal glands. Musculoskeletal: No aggressive osseous lesion. Review of the MIP images confirms the above findings. IMPRESSION: 1. Persistent spiculated LEFT upper lobe pulmonary nodule. No significant interval changed in 4 month interval. Lesion remains concerning for bronchogenic carcinoma. Consider FDG PET scan versus tissue sampling. 2. No evidence acute pulmonary embolism. 3. New bilateral pleural effusions. These results will be called to the ordering clinician or representative by the Radiologist Assistant, and communication documented in the PACS or [employer]. Electronically Signed   By: Jackquline Boxer  M.D.   On: 05/25/2023 14:26   ECHOCARDIOGRAM COMPLETE Result Date: 05/25/2023    ECHOCARDIOGRAM REPORT   Patient Name:   SHERI PROWS Date of Exam: 05/25/2023 Medical Rec #:  [id number]     Height:       70.0 in Accession #:    [id number]    Weight:       136.7 lb Date of Birth:  Jun 20, 1940    BSA:          1.776 m Patient Age:    82 years      BP:           115/69 mmHg Patient Gender: M             HR:           81 bpm. Exam Location:  [facility] Procedure: 2D Echo, Cardiac Doppler and Color Doppler Indications:    CHF-Acute Diastolic I50.31  History:        Patient has prior history of Echocardiogram examinations, most                 recent 02/27/2023. Previous Myocardial Infarction and CAD; Risk                 Factors:Hypertension and Dyslipidemia.  Sonographer:    Aida Pizza RCS Referring Phys: [id number] OLADAPO ADEFESO IMPRESSIONS  1. Left ventricular ejection fraction, by estimation, is 45 to 50%. The left ventricle has mildly decreased function. The left ventricle demonstrates global hypokinesis. Left ventricular diastolic parameters are indeterminate.  2. Right ventricular systolic function is low normal. The right ventricular size is mildly enlarged. There is severely elevated pulmonary artery systolic pressure.  3. Right atrial size was severely dilated.  4. Moderate pericardial effusion. The pericardial effusion is posterior to the left ventricle and the left atrium. There is no evidence of cardiac tamponade.  5. The mitral valve is normal in structure. No evidence of mitral valve regurgitation. No evidence of mitral stenosis.  6. Tricuspid valve regurgitation is mild to moderate.  7. The aortic valve is normal in structure. Aortic valve regurgitation is mild. Aortic valve sclerosis/calcification is present, without any evidence of aortic stenosis.  8. The inferior vena cava is dilated in size with <50% respiratory variability, suggesting right atrial pressure of 15 mmHg. FINDINGS  Left Ventricle:  Left ventricular ejection fraction, by estimation, is 45 to 50%. The left ventricle has mildly decreased function. The left ventricle demonstrates global hypokinesis. The left ventricular internal cavity size was normal in size. There is  no left ventricular hypertrophy. Left ventricular diastolic parameters are indeterminate. Right Ventricle: The right ventricular size is mildly enlarged. No increase in right ventricular wall thickness. Right ventricular systolic function is low normal. There is severely elevated pulmonary artery systolic pressure. The tricuspid regurgitant velocity is 4.23 m/s, and with an assumed right atrial pressure of  15 mmHg, the estimated right ventricular systolic pressure is 86.6 mmHg. Left Atrium: Left atrial size was normal in size. Right Atrium: Right atrial size was severely dilated. Pericardium: Simi Briel moderately sized pericardial effusion is present. The pericardial effusion is posterior to the left ventricle and the left atrium. There is no evidence of cardiac tamponade. Presence of epicardial fat layer. Mitral Valve: The mitral valve is normal in structure. No evidence of mitral valve regurgitation. No evidence of mitral valve stenosis. Tricuspid Valve: The tricuspid valve is normal in structure. Tricuspid valve regurgitation is mild to moderate. No evidence of tricuspid stenosis. Aortic Valve: The aortic valve is normal in structure. Aortic valve regurgitation is mild. Aortic regurgitation PHT measures 375 msec. Aortic valve sclerosis/calcification is present, without any evidence of aortic stenosis. Pulmonic Valve: The pulmonic valve was normal in structure. Pulmonic valve regurgitation is not visualized. No evidence of pulmonic stenosis. Aorta: The aortic root is normal in size and structure. Venous: The inferior vena cava is dilated in size with less than 50% respiratory variability, suggesting right atrial pressure of 15 mmHg. IAS/Shunts: No atrial level shunt detected by color  flow Doppler.  LEFT VENTRICLE PLAX 2D LVIDd:         4.30 cm   Diastology LVIDs:         3.00 cm   LV e' medial:    5.77 cm/s LV PW:         0.90 cm   LV E/e' medial:  18.7 LV IVS:        1.00 cm   LV e' lateral:   6.85 cm/s LVOT diam:     1.90 cm   LV E/e' lateral: 15.8 LV SV:         47 LV SV Index:   26 LVOT Area:     2.84 cm  RIGHT VENTRICLE RV S prime:     9.57 cm/s TAPSE (M-mode): 1.6 cm LEFT ATRIUM             Index        RIGHT ATRIUM           Index LA diam:        3.35 cm 1.89 cm/m   RA Area:     36.90 cm LA Vol (A2C):   32.4 ml 18.25 ml/m  RA Volume:   156.00 ml 87.86 ml/m LA Vol (A4C):   24.6 ml 13.85 ml/m LA Biplane Vol: 29.4 ml 16.56 ml/m  AORTIC VALVE LVOT Vmax:   94.00 cm/s LVOT Vmean:  59.000 cm/s LVOT VTI:    0.165 m AI PHT:      375 msec  AORTA Ao Root diam: 3.30 cm MITRAL VALVE                TRICUSPID VALVE MV Area (PHT): 4.60 cm     TR Peak grad:   71.6 mmHg MV Decel Time: 165 msec     TR Vmax:        423.00 cm/s MV E velocity: 108.00 cm/s                             SHUNTS                             Systemic VTI:  0.16 m  Systemic Diam: 1.90 cm Kardie Tobb DO Electronically signed by Kardie Tobb DO Signature Date/Time: 05/25/2023/2:20:34 PM    Final    US  Venous Img Lower Bilateral (DVT) Result Date: 05/25/2023 CLINICAL DATA:  Bilateral lower extremity edema EXAM: BILATERAL LOWER EXTREMITY VENOUS DOPPLER ULTRASOUND TECHNIQUE: Gray-scale sonography with graded compression, as well as color Doppler and duplex ultrasound were performed to evaluate the lower extremity deep venous systems from the level of the common femoral vein and including the common femoral, femoral, profunda femoral, popliteal and calf veins including the posterior tibial, peroneal and gastrocnemius veins when visible. The superficial great saphenous vein was also interrogated. Spectral Doppler was utilized to evaluate flow at rest and with distal augmentation maneuvers in the common  femoral, femoral and popliteal veins. COMPARISON:  None Available. FINDINGS: RIGHT LOWER EXTREMITY Common Femoral Vein: No evidence of thrombus. Normal compressibility, respiratory phasicity and response to augmentation. Saphenofemoral Junction: No evidence of thrombus. Normal compressibility and flow on color Doppler imaging. Profunda Femoral Vein: No evidence of thrombus. Normal compressibility and flow on color Doppler imaging. Femoral Vein: No evidence of thrombus. Normal compressibility, respiratory phasicity and response to augmentation. Popliteal Vein: No evidence of thrombus. Normal compressibility, respiratory phasicity and response to augmentation. Calf Veins: No evidence of thrombus. Normal compressibility and flow on color Doppler imaging. Superficial Great Saphenous Vein: No evidence of thrombus. Normal compressibility. Venous Reflux:  None. Other Findings:  Extensive subcutaneous edema. LEFT LOWER EXTREMITY Common Femoral Vein: No evidence of thrombus. Normal compressibility, respiratory phasicity and response to augmentation. Saphenofemoral Junction: No evidence of thrombus. Normal compressibility and flow on color Doppler imaging. Profunda Femoral Vein: No evidence of thrombus. Normal compressibility and flow on color Doppler imaging. Femoral Vein: No evidence of thrombus. Normal compressibility, respiratory phasicity and response to augmentation. Popliteal Vein: No evidence of thrombus. Normal compressibility, respiratory phasicity and response to augmentation. Calf Veins: No evidence of thrombus. Normal compressibility and flow on color Doppler imaging. Superficial Great Saphenous Vein: No evidence of thrombus. Normal compressibility. Venous Reflux:  None. Other Findings:  Extensive subcutaneous edema. IMPRESSION: No evidence of deep venous thrombosis in either lower extremity. Extensive bilateral lower extremity edema. Electronically Signed   By: Wilkie Lent M.D.   On: 05/25/2023 13:25    DG Chest Port 1 View Result Date: 05/24/2023 CLINICAL DATA:  Shortness of breath EXAM: PORTABLE CHEST 1 VIEW COMPARISON:  07/02/2022 FINDINGS: Mild left basilar atelectasis. Mild right basilar scarring, although superimposed pneumonia is possible. No pleural effusion or pneumothorax. Heart is top-normal in size. IMPRESSION: Mild right basilar scarring, superimposed pneumonia not excluded. Electronically Signed   By: Pinkie Pebbles M.D.   On: 05/24/2023 23:12        Scheduled Meds:  Chlorhexidine  Gluconate Cloth  6 each Topical Daily   enoxaparin  (LOVENOX ) injection  30 mg Subcutaneous Q24H   Continuous Infusions:   LOS: 2 days    Time spent: over 30 min     Meliton Monte, MD [facility]   To contact the attending provider between 7A-7P or the covering provider during after hours 7P-7A, please log into the web site [email or web address] and access using universal Rudolph password for that web site. If you do not have the password, please call the hospital operator.  05/26/2023, 10:39 AM

## 2023-05-26 NOTE — Progress Notes (Signed)
  Palliative: Thank you for this consult. Unfortunately due to high volume of consults there will be a delay in a Palliative Provider seeing this patient. Palliative Medicine will return to service on 05/27/23 and will see patient at that time.   No charge Lorenza Birkenhead, NP Palliative Medicine Please call Palliative Medicine team phone with any questions [phone]

## 2023-05-26 NOTE — Progress Notes (Signed)
 Patient up to [facility]. Bright red blood noted on pt's thighs, testicles, and penis. Urine in foley bag dark red.

## 2023-05-26 NOTE — Progress Notes (Addendum)
 Nurse at bedside,patient bleeding again at foley site,moderate amount of  blood noted, 300 mls of bloody urine in foley bag. Patient cleaned up, balloon deflated and inflated with 90ml's of saline.Dr Lowell Guitar notified.Plan of care on going.Marland Kitchen

## 2023-05-26 NOTE — Progress Notes (Signed)
 Mobility Specialist Progress Note:    05/26/23 1410  Mobility  Activity Dangled on edge of bed  Level of Assistance Contact guard assist, steadying assist  Assistive Device None  Range of Motion/Exercises Active;All extremities  Activity Response Tolerated well  Mobility Referral Yes  Mobility visit 1 Mobility  Mobility Specialist Start Time (ACUTE ONLY) 1350  Mobility Specialist Stop Time (ACUTE ONLY) 1410  Mobility Specialist Time Calculation (min) (ACUTE ONLY) 20 min   Pt received in bed, agreeable to mobility. Required CGA to sit EOB. Noticed blood in and around foley, notified nursing staff. Deferred further mobility d/t O2 desat and necessary peri care. SpO2 84% on 3L, SpO2 86% on 4L, SpO2 90% on 5L. Left pt supine with nurse at bedside. All needs met.   Sherrilee Ditty Mobility Specialist Please contact via Special Educational Needs Teacher or  Rehab office at [phone]

## 2023-05-26 NOTE — Progress Notes (Signed)
 Bloody urine noted to foley bag,and around foley insertion area, Dr Lowell Guitar notified. Plan of care on going.

## 2023-05-27 ENCOUNTER — Inpatient Hospital Stay (HOSPITAL_COMMUNITY): Payer: 59

## 2023-05-27 ENCOUNTER — Encounter (HOSPITAL_COMMUNITY): Payer: Self-pay | Admitting: Internal Medicine

## 2023-05-27 DIAGNOSIS — Z515 Encounter for palliative care: Secondary | ICD-10-CM | POA: Diagnosis not present

## 2023-05-27 DIAGNOSIS — Z7189 Other specified counseling: Secondary | ICD-10-CM | POA: Diagnosis not present

## 2023-05-27 DIAGNOSIS — I5033 Acute on chronic diastolic (congestive) heart failure: Secondary | ICD-10-CM | POA: Diagnosis not present

## 2023-05-27 LAB — PHOSPHORUS: Phosphorus: 3.2 mg/dL (ref 2.5–4.6)

## 2023-05-27 LAB — CBC WITH DIFFERENTIAL/PLATELET
Abs Immature Granulocytes: 0.02 10*3/uL (ref 0.00–0.07)
Basophils Absolute: 0 10*3/uL (ref 0.0–0.1)
Basophils Relative: 0 %
Eosinophils Absolute: 0.1 10*3/uL (ref 0.0–0.5)
Eosinophils Relative: 1 %
HCT: 45.8 % (ref 39.0–52.0)
Hemoglobin: 14.8 g/dL (ref 13.0–17.0)
Immature Granulocytes: 0 %
Lymphocytes Relative: 15 %
Lymphs Abs: 1.2 10*3/uL (ref 0.7–4.0)
MCH: 32.2 pg (ref 26.0–34.0)
MCHC: 32.3 g/dL (ref 30.0–36.0)
MCV: 99.8 fL (ref 80.0–100.0)
Monocytes Absolute: 0.6 10*3/uL (ref 0.1–1.0)
Monocytes Relative: 7 %
Neutro Abs: 6.1 10*3/uL (ref 1.7–7.7)
Neutrophils Relative %: 77 %
Platelets: 115 10*3/uL — ABNORMAL LOW (ref 150–400)
RBC: 4.59 MIL/uL (ref 4.22–5.81)
RDW: 14.8 % (ref 11.5–15.5)
WBC: 8 10*3/uL (ref 4.0–10.5)
nRBC: 0 % (ref 0.0–0.2)

## 2023-05-27 LAB — COMPREHENSIVE METABOLIC PANEL
ALT: 7 U/L (ref 0–44)
AST: 12 U/L — ABNORMAL LOW (ref 15–41)
Albumin: 2.6 g/dL — ABNORMAL LOW (ref 3.5–5.0)
Alkaline Phosphatase: 57 U/L (ref 38–126)
Anion gap: 7 (ref 5–15)
BUN: 27 mg/dL — ABNORMAL HIGH (ref 8–23)
CO2: 23 mmol/L (ref 22–32)
Calcium: 8.8 mg/dL — ABNORMAL LOW (ref 8.9–10.3)
Chloride: 105 mmol/L (ref 98–111)
Creatinine, Ser: 1.54 mg/dL — ABNORMAL HIGH (ref 0.61–1.24)
GFR, Estimated: 44 mL/min — ABNORMAL LOW (ref >60)
Glucose, Bld: 80 mg/dL (ref 70–99)
Potassium: 3.8 mmol/L (ref 3.5–5.1)
Sodium: 135 mmol/L (ref 135–145)
Total Bilirubin: 1.4 mg/dL — ABNORMAL HIGH (ref 0.0–1.2)
Total Protein: 5.3 g/dL — ABNORMAL LOW (ref 6.5–8.1)

## 2023-05-27 LAB — MAGNESIUM: Magnesium: 2 mg/dL (ref 1.7–2.4)

## 2023-05-27 LAB — BRAIN NATRIURETIC PEPTIDE: B Natriuretic Peptide: 1721 pg/mL — ABNORMAL HIGH (ref 0.0–100.0)

## 2023-05-27 MED ORDER — ALBUMIN HUMAN 25 % IV SOLN
25.0000 g | Freq: Four times a day (QID) | INTRAVENOUS | Status: AC
  Administered 2023-05-27 (×2): 25 g via INTRAVENOUS
  Filled 2023-05-27 (×2): qty 100

## 2023-05-27 MED ORDER — FUROSEMIDE 10 MG/ML IJ SOLN
40.0000 mg | Freq: Four times a day (QID) | INTRAMUSCULAR | Status: AC
  Administered 2023-05-27 (×2): 40 mg via INTRAVENOUS
  Filled 2023-05-27 (×2): qty 4

## 2023-05-27 MED ORDER — FUROSEMIDE 10 MG/ML IJ SOLN
40.0000 mg | Freq: Two times a day (BID) | INTRAMUSCULAR | Status: DC
Start: 2023-05-28 — End: 2023-05-29
  Administered 2023-05-28 – 2023-05-29 (×2): 40 mg via INTRAVENOUS
  Filled 2023-05-27 (×3): qty 4

## 2023-05-27 NOTE — Progress Notes (Signed)
 Nurse at bedside this am,Blood pressure 99/75,heart rate 92,oxygen saturation 80 on 5 liters,oxygen was increased to 6 liters via nasal canula,oxygen saturation at 93 percent. I reached out to Respiratory Therapy for support. Dr perri notified. Patient receiving a breathing treatment at this time. Plan of care on going.

## 2023-05-27 NOTE — Progress Notes (Signed)
 Foley catheter intact with no bloody drainage noted to insertion site this am. Yellow color urine noted in foley drainage bag this am.Plan of care on going.

## 2023-05-27 NOTE — Consult Note (Signed)
 Consultation Note Date: 05/27/2023   Patient Name: Angel [name]  DOB: August 30, 1940  MRN: [id number]  Age / Sex: 83 y.o., male  PCP: [name], MD Referring Physician: Perri DELENA Meliton Mickey., *  Reason for Consultation: Establishing goals of care  HPI/Patient Profile: 83 y.o. male  with past medical history of pulmonary hypertension, chronic respiratory failure with hypoxia 2 to 3 L at baseline, CAD with history of MI, HTN/HLD, DVT, GERD, 67.5-pack-year smoking history quit approximately 13 years ago, right kidney mass consistent with RCC followed by Dr. Sherrilee for surveillance, left upper lobe pulmonary nodule with smaller solid nodule scattered throughout bilateral lungs concerning for primary bronchogenic carcinoma followed by Dr. Darlean for surveillance, admitted on 05/24/2023 with acute on chronic heart failure/acute on chronic respiratory failure.   Clinical Assessment and Goals of Care: I have reviewed medical records including EPIC notes, labs and imaging, received report from RN, assessed the patient.  Angel [name] is lying quietly in bed.  He appears acutely/chronically ill and very frail.  He greets me, making and mostly keeping eye contact.  He is alert and oriented x 3, able to make his needs known.  There is no family at bedside at this time.  We meet at the bedside to discuss diagnosis prognosis, GOC, EOL wishes, disposition and options.  I introduced Palliative Medicine as specialized medical care for people living with serious illness. It focuses on providing relief from the symptoms and stress of a serious illness. The goal is to improve quality of life for both the patient and the family.  We discussed a brief life review of the patient.  Angel [name] tells me that he is a widower times 3+ years.  He shares that he has 6 children, but they work and have their own lives.  He shares that he is independent  with ADLs and IADLs.  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  He shares that he grew up in [place]  moving to [place]  in 1957.  He shares stories of good times together with his family as they were all musical.  He tells me that he works closely with his pulmonologist and urologist for suspected cancers.  He tells me that he is not seeking active treatment at this point.  We then focused on their current illness.  Overall, Angel [name] is somewhat knowledgeable about his acute health concern, his heart failure.  We talked about the treatment plan, and time for outcomes.  The natural disease trajectory and expectations at EOL were discussed.  Advanced directives, concepts specific to code status, artifical feeding and hydration, and rehospitalization were considered and discussed.  We talked about the concept of treat the treatable, but allow a natural passing.  Angel [name] states that he is really not considered these choices.  I encouraged him to do so and share his choices with his healthcare surrogate.  Hospice and Palliative Care services outpatient were explained and offered.  We talked about the benefits of outpatient palliative services for  further goals of care discussion.  We talked about the benefits of hospice care for relationship building (as Angel [name] states that he is lonely) and symptom management.  At this point, he does not except either palliative or hospice services.  PMT to follow.  Discussed the importance of continued conversation with family and the medical providers regarding overall plan of care and treatment options, ensuring decisions are within the context of the patient's values and GOCs.  Questions and concerns were addressed.  The family was encouraged to call with questions or concerns.  PMT will continue to support holistically.  Conference with attending, bedside nursing staff, transition of care team related to patient condition, needs, goals of care,  disposition.   HCPOA  NEXT OF KIN -Angel [name] does not have a named healthcare surrogate.  He states that he believes that he would choose to his youngest son Angel [name] or his daughter Angel [name] who lives in [place] .  I encouraged him to consider naming HCPOA and completing paperwork in addition to expressing what he does and does not want with his HCPOA.    SUMMARY OF RECOMMENDATIONS   At this point considering CODE STATUS. States he would prefer to return home. Considering outpatient palliative/hospice services.    Code Status/Advance Care Planning: Full code - We talked about the concept of treat the treatable, but allow a natural passing.  Angel [name] states that he is really not considered these choices.  I encouraged him to do so and share his choices with his healthcare surrogate.  Symptom Management:  Per hospitalist, no additional needs at this time.   Palliative Prophylaxis:  Frequent Pain Assessment and Oral Care  Additional Recommendations (Limitations, Scope, Preferences): Full Scope Treatment  Psycho-social/Spiritual:  Desire for further Chaplaincy support:no Additional Recommendations: Caregiving  Support/Resources and Education on Hospice  Prognosis:  < 6 months, or less would not be surprising based on frailty, chronic illness burden.  Discharge Planning: To be determined, prefers home with home health if qualified.  Would benefit from outpatient palliative/hospice care, considering.     Primary Diagnoses: Present on Admission:  CORONARY ATHEROSCLEROSIS NATIVE CORONARY ARTERY  Right kidney mass   I have reviewed the medical record, interviewed the patient and family, and examined the patient. The following aspects are pertinent.  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   Social History   Socioeconomic History   Marital status: Widowed    Spouse name: Not on file   Number of children: Not on file   Years of education: Not on file   Highest education level: Not on file  Occupational History   Not on file  Tobacco Use   Smoking status: Former    Current packs/day: 0.00    Average packs/day: 1.5  packs/day for 45.0 years (67.5 ttl pk-yrs)    Types: Cigarettes    Start date: 12/11/1964    Quit date: 12/11/2009    Years since quitting: 13.4   Smokeless tobacco: Never  Vaping Use   Vaping status: Never Used  Substance and Sexual Activity   Alcohol use: No   Drug use: No   Sexual activity: Not Currently    Birth control/protection: None  Other Topics Concern   Not on file  Social History Narrative   Married; full time; does not get regular exercise.    Social Drivers of Corporate Investment Banker Strain: Low Risk  (10/28/2022)   [facility]   Overall Financial Resource Strain (CARDIA)    Difficulty of Paying Living Expenses: Not hard at all  Food Insecurity: No Food Insecurity (05/25/2023)   Hunger Vital Sign    Worried About Running Out of Food in the Last Year: Never true    Ran Out of Food in the Last Year: Never true  Transportation Needs: No Transportation Needs (05/25/2023)   PRAPARE - Administrator, Civil Service (Medical): No    Lack of Transportation (Non-Medical): No  Physical Activity: Unknown (10/28/2022)   [facility]   Exercise Vital Sign    Days of Exercise per Week: 0 days    Minutes of Exercise per Session: Not on file  Recent  Concern: Physical Activity - Inactive (10/28/2022)   Received from [facility], [facility]   Exercise Vital Sign    Days of Exercise per Week: 0 days    Minutes of Exercise per Session: 0 min  Stress: No Stress Concern Present (10/28/2022)   Received from [facility], [facility] Occupational Health - Occupational Stress Questionnaire    Feeling of Stress : Not at all  Social Connections: Socially Isolated (05/25/2023)   Social Connection and Isolation Panel [NHANES]    Frequency of Communication with Friends and Family: Once a week    Frequency of Social Gatherings with Friends and Family: Once a week    Attends Religious Services: More than 4 times per year    Active Member of [employer] or Organizations: No    Attends Banker Meetings: Never    Marital Status: Widowed   History reviewed. No pertinent family history. Scheduled Meds:  Chlorhexidine  Gluconate Cloth  6 each Topical Daily   finasteride   5 mg Oral Daily   furosemide   40 mg Intravenous Q6H   Followed by   NOREEN ON 05/28/2023] furosemide   40 mg Intravenous BID   tamsulosin   0.4 mg Oral QPC supper   Continuous Infusions: PRN Meds:.acetaminophen  **OR** acetaminophen , albuterol , alum & mag hydroxide-simeth, ibuprofen , ondansetron  **OR** ondansetron  (ZOFRAN ) IV Medications Prior to Admission:  Prior to Admission medications   Medication Sig Start Date End Date Taking? Authorizing Provider  aspirin  EC 325 MG tablet Take 325 mg by mouth daily.   Yes [provider]  aspirin  EC 81 MG tablet Take 81 mg by mouth daily.   Yes [provider]  ibuprofen  (ADVIL ) 200 MG tablet Take 200 mg by mouth daily as needed for moderate pain (pain score 4-6).   Yes [provider]  nitroGLYCERIN  (NITROSTAT ) 0.4 MG SL tablet Place 1 tablet (0.4 mg total) under the tongue every 5 (five) minutes x 3 doses as needed for chest pain (if after 3rd dose no relief proceed to ED or call  911).  04/03/23  Yes Branch, Dorn FALCON, MD   Allergies  Allergen Reactions   Ciprofloxacin  Other (See Comments)   Crestor [Rosuvastatin] Other (See Comments)    Unknown    Penicillins Other (See Comments)    Per patient causes GI upset with high doses. He says he does OK with shots but gets GI upset with PO penicillins Can take small doses with no issues   Tramadol  Other (See Comments)    Confusion   Lisinopril  Rash and Cough    Patient says it caused bp to increase and caused a rash on his legs    Macrodantin  [Nitrofurantoin ] Rash   Review of Systems  Unable to perform ROS: Age    Physical Exam Vitals and nursing note reviewed.  Constitutional:      General: He is not in acute distress.    Appearance: He is ill-appearing.     Comments: Frail and thin   Cardiovascular:     Rate and Rhythm: Normal rate.  Pulmonary:     Effort: Pulmonary effort is normal. No respiratory distress.  Skin:    General: Skin is warm and dry.  Neurological:     Mental Status: He is alert and oriented to person, place, and time.  Psychiatric:        Mood and Affect: Mood normal.        Behavior: Behavior normal.     Vital Signs: BP 108/66 (BP Location: Right Arm)   Pulse 85   Temp 98.7 F (37.1 C) (Oral)   Resp 19   Ht 5' 10 (1.778 m)   Wt 64.5 kg   SpO2 96%   BMI 20.40 kg/m  Pain Scale: 0-10   Pain Score: 0-No pain   SpO2: SpO2: 96 % O2 Device:SpO2: 96 % O2 Flow Rate: .O2 Flow Rate (L/min): 6 L/min  IO: Intake/output summary:  Intake/Output Summary (Last 24 hours) at 05/27/2023 1516 Last data filed at 05/27/2023 0110 Gross per 24 hour  Intake --  Output 450 ml  Net -450 ml    LBM: Last BM Date : 05/25/23 Baseline Weight: Weight: 62 kg Most recent weight: Weight: 64.5 kg     Palliative Assessment/Data:     Time In: 1400  Time Out: 1515 Time Total: 75 minutes  Greater than 50%  of this time was spent counseling and coordinating care related to the above assessment  and plan.  Signed by: Lorenza DELENA Birkenhead, NP   Please contact Palliative Medicine Team phone at [phone] for questions and concerns.  For individual provider: See Tracey

## 2023-05-27 NOTE — Progress Notes (Addendum)
 PROGRESS NOTE    NEWELL WAFER  FMW:[id number] DOB: Jan 27, 1941 DOA: 05/24/2023 PCP: [name], MD  Chief Complaint  Patient presents with   Leg Swelling    Brief Narrative:   Angel [name] is Angel [name] 83 y.o. male with medical history significant of pulmonary hypertension, hyperlipidemia, CAD, chronic respiratory failure with hypoxia on 2-3 LPM of oxygen at baseline who presents to the emergency department due to few days of onset of increasing severe shortness of breath.  Shortness of breath was worse tonight, so he activated EMS, on arrival of EMS team, O2 sat was noted to be at 72% on room air. He complained of bilateral leg swelling up to the thighs bilaterally.   Patient denies chest pain, chills, nausea, vomiting, coughing.   He's been admitted for Luciel Brickman HF exacerbation.  Assessment & Plan:   Principal Problem:   Acute on chronic diastolic CHF (congestive heart failure) (HCC) Active Problems:   CORONARY ATHEROSCLEROSIS NATIVE CORONARY ARTERY   Right kidney mass   Elevated brain natriuretic peptide (BNP) level   History of pulmonary hypertension   Acute on chronic respiratory failure with hypoxia (HCC)  Goals of care Appreciate palliative assistance.  He's chronically ill.  Not quite clear to me overall goals of care esp when considering his spiculated pulmonary nodule and his renal mass.  Appreciate palliative care assistance.  He's medically complex and will need coordinated care.  Per family (daughter) he lives in poverty, she's worried about him being able to get the care he needs.    Acute on Chronic Hypoxic Respiratory Failure Acute on chronic diastolic congestive heart failure  Bilateral Pleural Effusions Uses 2-3 L O2 at home, on 5-6 L here (was on up to 12 L this AM), due to heart failure/volume overload At presentation, noted to have bilateral LE edema (to proximal thighs) Persistent dependent edema today CXR pending  Lasix  with albumin  today  Echo with EF 45-50%,  global hypokinesis.  Severely elevated PASP.  Moderate pericardial effusion.  IVC dilated with <50% resp variability.   Continue total input/output (net negative 2 L), daily weights (appear inaccurate)   Acute Kidney Injury He's still overloaded on exam, diurese despite abnormal renal function renal US  UA   Urinary Retention  Hematuria Hematuria I suspect related to foley catheter placement Will monitor H/H  (stable today) and hematuria, consider urology involvement (discussed with Belvie Clara, his outpatient urologist on 1/13, recommended finasteride  and said he'd be by - follow up message via secure chat today) Will need to d/c with foley and follow with urology for TOV   History of pulmonary hypertension Echocardiogram done on 02/27/2023 showed an estimated RVSP of 88.3 mmHg He declined RHC per pulm Patient follows with Dr. Darlean with last office visit being 02/18/2023   Pericardial Effusion Consider discussion with cardiology Will need repeat echo   Acute on chronic respiratory failure with hypoxia Saw Dr. Darlean 02/2023, not immediately clear to me underlying cause of chronic resp failure - possible ILD? Cor pulmonale. 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 Darlean Outpatient   CAD Patient has history of prostate in September 2011 to RCA Continue aspirin  per home regimen Patient was not on antihyperlipidemic due to refusal per Cardiologist's medical record Patient follows with  Dr. Alvan   Right kidney mass Renal ultrasound shows 8.8 cm right renal mass in the inferior pole of right kidney which is consistent with renal cell carcinoma  (03/22/2023) Patient follows with Dr. Sherrilee   Spiculated solid 1.6 cm posterior L upper lobe pulmonary nodule Solid 0.7 cm peripheral LUL pulmonary nodule (warrants CT chest follow up) Smaller Solid Pulmonary Nodules Scattered Throughout Both Lungs Concerning for primary bronchogenic carcinoma Sounds like no plans for PET at this time based on Dr. Chari note from 02/2023     DVT prophylaxis: SCD Code Status: full Family Communication: daughter Disposition:   Status is: Inpatient Remains inpatient appropriate because: need for continued inpatient care   Consultants:  Urology palliative  Procedures:  Echo IMPRESSIONS     1. Left ventricular ejection fraction, by estimation, is 45 to 50%. The  left ventricle has mildly decreased function. The left ventricle  demonstrates global hypokinesis. Left ventricular diastolic parameters are  indeterminate.   2. Right ventricular systolic function is low normal. The right  ventricular size is mildly enlarged. There is severely elevated pulmonary  artery systolic pressure.   3. Right atrial size was severely dilated.   4. Moderate pericardial effusion. The pericardial effusion is posterior  to the left ventricle and the left atrium. There is no evidence of cardiac  tamponade.   5. The mitral valve is normal in structure. No evidence of mitral valve  regurgitation. No evidence of mitral stenosis.   6. Tricuspid valve regurgitation is mild to moderate.   7. The aortic valve is normal in structure. Aortic valve regurgitation is  mild. Aortic valve sclerosis/calcification is present, without any  evidence of aortic stenosis.   8. The inferior vena cava is dilated in size with <50% respiratory  variability, suggesting right atrial pressure of 15 mmHg.   Antimicrobials:  Anti-infectives (From admission, onward)    None       Subjective: No new complaints  Objective: Vitals:   05/27/23 0849 05/27/23 0917 05/27/23 1424  05/27/23 1426  BP:    108/66  Pulse:    85  Resp:    19  Temp:      TempSrc:      SpO2: 92% 92% 96% 96%  Weight:      Height:        Intake/Output Summary (Last 24 hours) at 05/27/2023 1615 Last data filed at 05/27/2023 0110 Gross per 24 hour  Intake --  Output 450 ml  Net -450 ml   Filed Weights   05/24/23 2240 05/26/23 0511 05/27/23 0419  Weight: 62 kg 53.3 kg 64.5 kg    Examination:  General exam: chronically ill appearing Respiratory system: diminished Cardiovascular system: RRR Gastrointestinal system: Abdomen is nondistended, soft and nontender.  Central nervous system: Alert and oriented. No focal neurological deficits. Extremities: bilateral dependent edema, swelling noted to penis/hips/le's - 1+   Data Reviewed: I have personally reviewed following labs and imaging studies  CBC: Recent Labs  Lab 05/24/23 2232 05/25/23 0404 05/26/23 0505 05/26/23 1419 05/27/23 0329  WBC 6.2 7.6 9.7  --  8.0  NEUTROABS 4.1  --  7.8*  --  6.1  HGB 17.9* 16.6 16.1 16.3 14.8  HCT 53.2* 48.8 47.1 49.6 45.8  MCV 99.4 98.8 98.3  --  99.8  PLT 158 148* 128*  --  115*    Basic Metabolic Panel: Recent Labs  Lab 05/24/23 2232 05/25/23 0404 05/26/23 0505 05/27/23 0329  NA 135 136 133* 135  K  4.0 3.5 4.6 3.8  CL 107 104 105 105  CO2 23 21* 19* 23  GLUCOSE 97 89 92 80  BUN 20 21 26* 27*  CREATININE 1.24 1.21 1.76* 1.54*  CALCIUM  8.8* 8.7* 8.8* 8.8*  MG  --  1.9 2.1 2.0  PHOS  --  2.6 3.9 3.2    GFR: Estimated Creatinine Clearance: 33.2 mL/min (Maryori Weide) (by C-G formula based on SCr of 1.54 mg/dL (H)).  Liver Function Tests: Recent Labs  Lab 05/24/23 2232 05/25/23 0404 05/26/23 0505 05/27/23 0329  AST 15 16 18  12*  ALT 10 8 8 7   ALKPHOS 89 74 65 57  BILITOT 1.8* 2.0* 1.7* 1.4*  PROT 7.0 6.3* 5.9* 5.3*  ALBUMIN  3.5 3.1* 2.9* 2.6*    CBG: No results for input(s): GLUCAP in the last 168 hours.   No results found for this or any previous visit (from the past  240 hours).       Radiology Studies: No results found.      Scheduled Meds:  Chlorhexidine  Gluconate Cloth  6 each Topical Daily   finasteride   5 mg Oral Daily   furosemide   40 mg Intravenous Q6H   Followed by   NOREEN ON 05/28/2023] furosemide   40 mg Intravenous BID   tamsulosin   0.4 mg Oral QPC supper   Continuous Infusions:   LOS: 3 days    Time spent: over 30 min     Meliton Monte, MD [facility]   To contact the attending provider between 7A-7P or the covering provider during after hours 7P-7A, please log into the web site [email or web address] and access using universal [place] password for that web site. If you do not have the password, please call the hospital operator.  05/27/2023, 4:15 PM

## 2023-05-27 NOTE — Progress Notes (Signed)
 Mobility Specialist Progress Note:    05/27/23 1355  Mobility  Activity Stood at bedside;Transferred to/from [facility]  Level of Assistance Standby assist, set-up cues, supervision of patient - no hands on  Assistive Device None  Distance Ambulated (ft) 5 ft  Range of Motion/Exercises Active;All extremities  Activity Response Tolerated well  Mobility Referral Yes  Mobility visit 1 Mobility  Mobility Specialist Start Time (ACUTE ONLY) 1355  Mobility Specialist Stop Time (ACUTE ONLY) 1415  Mobility Specialist Time Calculation (min) (ACUTE ONLY) 20 min   Pt received in bed, agreeable to mobility. Required SBA to stand and transfer with no AD. Tolerated well, deferred further ambulation d/t recent O2 desats. SpO2 96% on 8L throughout session. Left pt on BSC, call bell in reach. NT notified, all needs met.   Sherrilee Ditty Mobility Specialist Please contact via Special Educational Needs Teacher or  Rehab office at [phone]

## 2023-05-27 NOTE — Plan of Care (Signed)
   Problem: Education: Goal: Knowledge of General Education information will improve Description Including pain rating scale, medication(s)/side effects and non-pharmacologic comfort measures Outcome: Progressing   Problem: Education: Goal: Knowledge of General Education information will improve Description Including pain rating scale, medication(s)/side effects and non-pharmacologic comfort measures Outcome: Progressing

## 2023-05-28 DIAGNOSIS — N401 Enlarged prostate with lower urinary tract symptoms: Secondary | ICD-10-CM | POA: Diagnosis not present

## 2023-05-28 DIAGNOSIS — Z515 Encounter for palliative care: Secondary | ICD-10-CM

## 2023-05-28 DIAGNOSIS — Z87898 Personal history of other specified conditions: Secondary | ICD-10-CM | POA: Diagnosis not present

## 2023-05-28 DIAGNOSIS — J810 Acute pulmonary edema: Secondary | ICD-10-CM

## 2023-05-28 DIAGNOSIS — J9601 Acute respiratory failure with hypoxia: Secondary | ICD-10-CM | POA: Diagnosis not present

## 2023-05-28 DIAGNOSIS — C649 Malignant neoplasm of unspecified kidney, except renal pelvis: Secondary | ICD-10-CM | POA: Diagnosis not present

## 2023-05-28 DIAGNOSIS — R339 Retention of urine, unspecified: Secondary | ICD-10-CM

## 2023-05-28 DIAGNOSIS — I5033 Acute on chronic diastolic (congestive) heart failure: Secondary | ICD-10-CM | POA: Diagnosis not present

## 2023-05-28 DIAGNOSIS — I272 Pulmonary hypertension, unspecified: Secondary | ICD-10-CM

## 2023-05-28 DIAGNOSIS — Z7189 Other specified counseling: Secondary | ICD-10-CM | POA: Diagnosis not present

## 2023-05-28 MED ORDER — METOPROLOL TARTRATE 25 MG PO TABS: 12.5000 mg | ORAL_TABLET | Freq: Two times a day (BID) | ORAL | 1 refills | Status: DC

## 2023-05-28 MED ORDER — TAMSULOSIN HCL 0.4 MG PO CAPS: 0.4000 mg | ORAL_CAPSULE | Freq: Every day | ORAL | 1 refills | Status: DC

## 2023-05-28 MED ORDER — POLYETHYLENE GLYCOL 3350 17 G PO PACK
17.0000 g | PACK | Freq: Two times a day (BID) | ORAL | Status: DC
  Administered 2023-05-28: 17 g via ORAL
  Filled 2023-05-28 (×3): qty 1

## 2023-05-28 MED ORDER — METOPROLOL TARTRATE 25 MG PO TABS
12.5000 mg | ORAL_TABLET | Freq: Two times a day (BID) | ORAL | Status: DC
Start: 2023-05-28 — End: 2023-05-31
  Administered 2023-05-28 – 2023-05-30 (×3): 12.5 mg via ORAL
  Filled 2023-05-28 (×6): qty 1

## 2023-05-28 MED ORDER — FINASTERIDE 5 MG PO TABS: 5.0000 mg | ORAL_TABLET | Freq: Every day | ORAL | 1 refills | Status: DC

## 2023-05-28 NOTE — Consult Note (Signed)
 Urology Consult  Referring physician: Dr. Ada Acres Reason for referral: urinary retention, gross hematuria  Chief Complaint: Urinary retention  History of Present Illness: Angel [name] is a 82yo with a history of CAD, HTN, MI, BPH, Renal cancer admitted with CHF. During diuresis the patient was unable to urinate and a foley catheter was placed. After foley placement the patient developed gross hematuria which has improved. He has had 3.3L urine output over the past 24 hours. He was followed at [facility] for his bladder calculi and BPH and is currently on flomax  0.4mg  daily. The patient continues to have functional decline and was deemed not a surgical candidate for nephrectomy. Creatinine is 1.54. He is currently having pain with the foley in place and is requesting removal.   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   Past Surgical History:  Procedure Laterality Date   COLONOSCOPY     COLONOSCOPY     CORONARY ANGIOPLASTY  2010   Dr Stann Earnest   CYSTOSCOPY WITH LITHOLAPAXY N/A 02/24/2017   Procedure: CYSTOSCOPY WITH LITHOLAPAXY;  Surgeon: Marco Severs, MD;  Location: [facility];  Service: Urology;  Laterality: N/A;   CYSTOSCOPY WITH URETHRAL DILATATION  02/24/2017   Procedure: CYSTOSCOPY WITH URETHRAL DILATATION;  Surgeon: Marco Severs, MD;  Location: [facility];  Service: Urology;;   DOPPLER ECHOCARDIOGRAPHY     EAR CYST EXCISION Left 07/23/2012   Procedure:  EXCISION LEFT EAR LOBE;  Surgeon: Vernadine Golas, MD;  Location[facility] OR;  Service: ENT;  Laterality: Left;   HOLMIUM LASER APPLICATION N/A 02/24/2017   Procedure: HOLMIUM LASER APPLICATION OF BLADDER CALCULI;  Surgeon: Marco Severs, MD;  Location: [facility];  Service: Urology;  Laterality: N/A;   IR RADIOLOGIST EVAL & MGMT  11/28/2016    Medications: I have reviewed the patient's current medications. Allergies:  Allergies  Allergen Reactions   Ciprofloxacin  Other (See Comments)   Crestor [Rosuvastatin] Other (See Comments)    Unknown    Penicillins Other (See Comments)    Per patient causes GI upset with high doses. He says he does OK with shots but gets GI upset with PO penicillins Can take small doses with no issues   Tramadol  Other (See Comments)    Confusion   Lisinopril  Rash and Cough    Patient says it caused bp to increase and caused a rash on his legs    Macrodantin  [Nitrofurantoin ] Rash    History reviewed. No pertinent family history. Social History:  reports that he quit smoking about 13 years ago. His smoking use included cigarettes. He started smoking about 58 years ago. He has a 67.5 pack-year smoking history. He has never used smokeless tobacco. He reports that he does not drink alcohol and does not use drugs.  Review of Systems  Constitutional:  Positive for unexpected weight change.  Genitourinary:  Positive for difficulty urinating.  All other systems reviewed and are negative.   Physical Exam:  Vital signs in last 24 hours: Temp:  [97.3 F (36.3 C)-97.9 F (36.6 C)] 97.9 F (36.6 C) (01/15 1351) Pulse Rate:  [73-79] 74 (01/15 1351) Resp:  [18-19] 19 (01/15 0448) BP: (91-113)/(50-71) 113/71 (01/15 1351) SpO2:  [92 %-100 %] 100 % (01/15 1351) Weight:  [62.2 kg] 62.2 kg (01/15 1114) Physical Exam Vitals reviewed.  HENT:     Head: Normocephalic and atraumatic.     Nose: Nose normal.     Mouth/Throat:     Mouth: Mucous membranes are dry.  Eyes:      Extraocular Movements: Extraocular movements intact.     Pupils: Pupils are equal, round, and reactive to light.  Cardiovascular:     Rate and Rhythm: Normal rate and regular rhythm.  Pulmonary:     Effort: Pulmonary effort is normal. No respiratory distress.  Abdominal:     General: Abdomen is flat. There is no distension.  Musculoskeletal:        General: No swelling. Normal range of motion.     Cervical back: Normal range of motion.  Skin:    General: Skin is warm and dry.  Neurological:     General: No focal deficit present.     Mental Status: He is alert and oriented to person, place, and time.  Psychiatric:        Mood and Affect: Mood normal.        Behavior: Behavior normal.        Thought Content: Thought content normal.        Judgment: Judgment normal.     Laboratory Data:  Results for orders placed or performed during the hospital encounter of 05/24/23 (from the past 72 hours)  CBC with Differential/Platelet     Status: Abnormal   Collection Time: 05/26/23  5:05 AM  Result Value Ref Range   WBC 9.7 4.0 - 10.5 K/uL   RBC 4.79 4.22 - 5.81 MIL/uL   Hemoglobin 16.1 13.0 - 17.0 g/dL   HCT 16.1 09.6 - 04.5 %   MCV 98.3 80.0 - 100.0 fL   MCH 33.6 26.0 - 34.0 pg   MCHC 34.2 30.0 - 36.0 g/dL   RDW 40.9 81.1 - 91.4 %   Platelets 128 (L) 150 - 400 K/uL   nRBC 0.0 0.0 - 0.2 %   Neutrophils Relative % 81 %   Neutro Abs 7.8 (H) 1.7 - 7.7 K/uL   Lymphocytes Relative 11 %   Lymphs Abs 1.1 0.7 - 4.0 K/uL   Monocytes Relative 7 %   Monocytes Absolute 0.7 0.1 - 1.0 K/uL   Eosinophils Relative 0 %   Eosinophils Absolute 0.0 0.0 - 0.5 K/uL   Basophils Relative 0 %   Basophils Absolute 0.0 0.0 - 0.1 K/uL   Immature Granulocytes 1 %   Abs Immature Granulocytes 0.07 0.00 - 0.07 K/uL    Comment: Performed at [facility], [address], [place], [place] [address]  Comprehensive metabolic panel     Status: Abnormal   Collection Time: 05/26/23  5:05 AM  Result Value Ref Range    Sodium 133 (L) 135 - 145 mmol/L   Potassium 4.6 3.5 - 5.1 mmol/L   Chloride 105 98 - 111 mmol/L   CO2 19 (L) 22 - 32 mmol/L   Glucose, Bld 92 70 -  99 mg/dL    Comment: Glucose reference range applies only to samples taken after fasting for at least 8 hours.   BUN 26 (H) 8 - 23 mg/dL   Creatinine, Ser 5.62 (H) 0.61 - 1.24 mg/dL   Calcium  8.8 (L) 8.9 - 10.3 mg/dL   Total Protein 5.9 (L) 6.5 - 8.1 g/dL   Albumin  2.9 (L) 3.5 - 5.0 g/dL   AST 18 15 - 41 U/L   ALT 8 0 - 44 U/L   Alkaline Phosphatase 65 38 - 126 U/L   Total Bilirubin 1.7 (H) 0.0 - 1.2 mg/dL   GFR, Estimated 38 (L) >60 mL/min    Comment: (NOTE) Calculated using the CKD-EPI Creatinine Equation (2021)    Anion gap 9 5 - 15    Comment: Performed at [facility], [address], [place], [place] [address]  Magnesium     Status: None   Collection Time: 05/26/23  5:05 AM  Result Value Ref Range   Magnesium 2.1 1.7 - 2.4 mg/dL    Comment: Performed at [facility], [address] [place], [place] [address]  Phosphorus     Status: None   Collection Time: 05/26/23  5:05 AM  Result Value Ref Range   Phosphorus 3.9 2.5 - 4.6 mg/dL    Comment: Performed at [facility], [address], [place], [place] [address]  Hemoglobin and hematocrit, blood     Status: None   Collection Time: 05/26/23  2:19 PM  Result Value Ref Range   Hemoglobin 16.3 13.0 - 17.0 g/dL   HCT 28.4 13.2 - 44.0 %    Comment: Performed at [facility], [address], [place], [place] [address]  Type and screen [facility]     Status: None   Collection Time: 05/26/23  2:19 PM  Result Value Ref Range   ABO/RH(D) O POS    Antibody Screen NEG    Sample Expiration      05/29/2023,2359 Performed at [facility], [address] [place], [place] [address]   CBC with Differential/Platelet     Status: Abnormal   Collection Time: 05/27/23  3:29 AM  Result Value Ref Range   WBC 8.0 4.0 - 10.5 K/uL   RBC 4.59 4.22 - 5.81 MIL/uL   Hemoglobin 14.8 13.0 -  17.0 g/dL   HCT 40.3 47.4 - 25.9 %   MCV 99.8 80.0 - 100.0 fL   MCH 32.2 26.0 - 34.0 pg   MCHC 32.3 30.0 - 36.0 g/dL   RDW 56.3 87.5 - 64.3 %   Platelets 115 (L) 150 - 400 K/uL   nRBC 0.0 0.0 - 0.2 %   Neutrophils Relative % 77 %   Neutro Abs 6.1 1.7 - 7.7 K/uL   Lymphocytes Relative 15 %   Lymphs Abs 1.2 0.7 - 4.0 K/uL   Monocytes Relative 7 %   Monocytes Absolute 0.6 0.1 - 1.0 K/uL   Eosinophils Relative 1 %   Eosinophils Absolute 0.1 0.0 - 0.5 K/uL   Basophils Relative 0 %   Basophils Absolute 0.0 0.0 - 0.1 K/uL   Immature Granulocytes 0 %   Abs Immature Granulocytes 0.02 0.00 - 0.07 K/uL    Comment: Performed at [facility], [address], [place], [place] [address]  Comprehensive metabolic panel     Status: Abnormal   Collection Time: 05/27/23  3:29 AM  Result Value Ref Range   Sodium 135 135 - 145 mmol/L   Potassium 3.8 3.5 - 5.1  mmol/L   Chloride 105 98 - 111 mmol/L   CO2 23 22 - 32 mmol/L   Glucose, Bld 80 70 - 99 mg/dL    Comment: Glucose reference range applies only to samples taken after fasting for at least 8 hours.   BUN 27 (H) 8 - 23 mg/dL   Creatinine, Ser 1.61 (H) 0.61 - 1.24 mg/dL   Calcium  8.8 (L) 8.9 - 10.3 mg/dL   Total Protein 5.3 (L) 6.5 - 8.1 g/dL   Albumin  2.6 (L) 3.5 - 5.0 g/dL   AST 12 (L) 15 - 41 U/L   ALT 7 0 - 44 U/L   Alkaline Phosphatase 57 38 - 126 U/L   Total Bilirubin 1.4 (H) 0.0 - 1.2 mg/dL   GFR, Estimated 44 (L) >60 mL/min    Comment: (NOTE) Calculated using the CKD-EPI Creatinine Equation (2021)    Anion gap 7 5 - 15    Comment: Performed at [facility], [address] [place], [place] [address]  Brain natriuretic peptide     Status: Abnormal   Collection Time: 05/27/23  3:29 AM  Result Value Ref Range   B Natriuretic Peptide 1,721.0 (H) 0.0 - 100.0 pg/mL    Comment: Performed at [facility], [address] [place], [place] [address]  Magnesium     Status: None   Collection Time: 05/27/23  3:29 AM  Result Value Ref Range    Magnesium 2.0 1.7 - 2.4 mg/dL    Comment: Performed at [facility], [address] [place], [place] [address]  Phosphorus     Status: None   Collection Time: 05/27/23  3:29 AM  Result Value Ref Range   Phosphorus 3.2 2.5 - 4.6 mg/dL    Comment: Performed at [facility], [address], [place], [place] [address]   No results found for this or any previous visit (from the past 240 hours). 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 If the patient does not urinare for over 5 hours please complete a bladder scan and if the patient has >500cc in his bladder please replace the foley. Please continue flomax  0.4mg  daily Gross hematuria: his hematuria is likely related to prostatic bleeding after foley placement and has since resolved.  RCC with likely pulmonary mets: The patient remains a poor surgical candidate for palliative nephrectomy. Please consider Palliative Care consult.   Angel [name] 05/28/2023, 7:05 PM

## 2023-05-28 NOTE — Plan of Care (Signed)
  Problem: Acute Rehab PT Goals(only PT should resolve) Goal: Patient Will Transfer Sit To/From Stand Flowsheets (Taken 05/28/2023 1027) Patient will transfer sit to/from stand: Independently Goal: Pt Will Ambulate Flowsheets (Taken 05/28/2023 1027) Pt will Ambulate:  > 125 feet  Independently  with least restrictive assistive device  Gatha Kaska PT, DPT [facility]- [place] 336 [phone] office

## 2023-05-28 NOTE — Progress Notes (Addendum)
 Nurse at bedside this am,patient refused his lasix ,Blood Pressure 106/71,heart rate 73,Dr Versa Gore notified. Plan of care on going.

## 2023-05-28 NOTE — Care Management Important Message (Signed)
 Important Message  Patient Details  Name: Angel [name] MRN: [id number] Date of Birth: 1940-07-06   Important Message Given:  N/A - LOS <3 / Initial given by admissions     [name] L [name] 05/28/2023, 12:32 PM

## 2023-05-28 NOTE — Evaluation (Signed)
 Physical Therapy Evaluation Patient Details Name: Angel [name] MRN: [id number] DOB: 1941-03-28 Today's Date: 05/28/2023  History of Present Illness  83 y.o. male past medical history significant for pulmonary hypertension, hyperlipidemia chronic respiratory failure with hypoxia on 2 to 3 L of oxygen at baseline comes into the ED for increased shortness of breath started on the day of admission he called EMS sats were 72% on 2 L.  He has been complaining of bilateral lower extremity swelling to the thigh denies any chest pain  Clinical Impression   Pt tolerated today's Physical Therapy Evaluation, well with decent carryover for mobility. Oxygen saturation was variable between 87-92% while on 4Ls during mobility. No shortness of breath or fatigue observed with ambulation using RW. Pt at baseline is independent without AD but has access to RW at home. Pt preferring to DC home and discharge to rehab facility, pt would benefit as he lives alone at home but at this time, pt would prefer DC to home with rehab to visit at home. Pt demonstrating mild decrease in functional status in bed mobility, ADLs, transfers, and ambulation due to muscle weakness, mild balance deficits, and deconitioning. Based upon these deficits/impairments, patient will benefit from continued skilled physical therapy services during remainder of hospital stay and at the next recommended venue of care to address deficits and promote return to optimal function.                If plan is discharge home, recommend the following: A little help with walking and/or transfers;A little help with bathing/dressing/bathroom   Can travel by private vehicle        Equipment Recommendations None recommended by PT;Rolling walker (2 wheels)  Recommendations for Other Services       Functional Status Assessment Patient has had a recent decline in their functional status and demonstrates the ability to make significant improvements in  function in a reasonable and predictable amount of time.     Precautions / Restrictions Precautions Precautions: None Restrictions Weight Bearing Restrictions Per Provider Order: No      Mobility  Bed Mobility Overal bed mobility: Modified Independent             General bed mobility comments: slow, labored with use of bedrail Patient Response: Cooperative  Transfers Overall transfer level: Modified independent Equipment used: Rolling walker (2 wheels)               General transfer comment: power to stand with great UE management to RW    Ambulation/Gait Ambulation/Gait assistance: Modified independent (Device/Increase time) Gait Distance (Feet): 15 Feet Assistive device: Rolling walker (2 wheels) Gait Pattern/deviations: WFL(Within Functional Limits)       General Gait Details: slow, steady gait with RW, 5Ls oxygen while ambulating with great turning.  Stairs            Wheelchair Mobility     Tilt Bed Tilt Bed Patient Response: Cooperative  Modified Rankin (Stroke Patients Only)       Balance Overall balance assessment: Mild deficits observed, not formally tested                                           Pertinent Vitals/Pain Pain Assessment Pain Assessment: No/denies pain    Home Living Family/patient expects to be discharged to:: Private residence Living Arrangements: Alone   Type of Home: House Home Access: Stairs  to enter Entrance Stairs-Rails: Can reach both Entrance Stairs-Number of Steps: 5   Home Layout: One level Home Equipment: Agricultural consultant (2 wheels)      Prior Function Prior Level of Function : Independent/Modified Independent             Mobility Comments: No AD with ambulation ADLs Comments: independent     Extremity/Trunk Assessment   Upper Extremity Assessment Upper Extremity Assessment: Overall WFL for tasks assessed    Lower Extremity Assessment Lower Extremity Assessment:  Generalized weakness       Communication   Communication Communication: No apparent difficulties Cueing Techniques: Verbal cues;Tactile cues  Cognition Arousal: Alert Behavior During Therapy: WFL for tasks assessed/performed Overall Cognitive Status: Within Functional Limits for tasks assessed                                          General Comments      Exercises     Assessment/Plan    PT Assessment Patient needs continued PT services  PT Problem List Decreased strength;Decreased activity tolerance;Decreased balance;Decreased mobility;Decreased coordination       PT Treatment Interventions DME instruction;Gait training;Functional mobility training;Stair training;Therapeutic activities;Therapeutic exercise;Balance training;Neuromuscular re-education    PT Goals (Current goals can be found in the Care Plan section)  Acute Rehab PT Goals Patient Stated Goal: return home PT Goal Formulation: With patient Time For Goal Achievement: 06/11/23 Potential to Achieve Goals: Good    Frequency Min 2X/week     Co-evaluation               AM-PAC PT "6 Clicks" Mobility  Outcome Measure Help needed turning from your back to your side while in a flat bed without using bedrails?: None Help needed moving from lying on your back to sitting on the side of a flat bed without using bedrails?: None Help needed moving to and from a bed to a chair (including a wheelchair)?: None Help needed standing up from a chair using your arms (e.g., wheelchair or bedside chair)?: None Help needed to walk in hospital room?: None Help needed climbing 3-5 steps with a railing? : A Little 6 Click Score: 23    End of Session Equipment Utilized During Treatment: Gait belt Activity Tolerance: Patient tolerated treatment well Patient left: in chair;with call bell/phone within reach Nurse Communication: Mobility status PT Visit Diagnosis: Unsteadiness on feet (R26.81);Muscle  weakness (generalized) (M62.81)    Time: 3086-5784 PT Time Calculation (min) (ACUTE ONLY): 23 min   Charges:   PT Evaluation $PT Eval Low Complexity: 1 Low   PT General Charges $$ ACUTE PT VISIT: 1 Visit         Astrid Lay, DPT [facility]- [place] 336 [phone] office  Gatha Kaska 05/28/2023, 10:24 AM

## 2023-05-28 NOTE — Plan of Care (Signed)

## 2023-05-28 NOTE — Progress Notes (Signed)
 Palliative: [name] is sitting up in the [place] chair in his room.  He appears chronically ill and somewhat frail.  He greets me, making and mostly keeping eye contact.  He is alert and oriented, able to make his needs known.  There is no family at bedside at this time but bedside nursing staff is present attending to needs.  We talk about the plan for discharge today.  [name] states that he is encouraged to return home.  We talk about our discussions yesterday, and I ask if he has any questions.  At this point, he states he does not.  We continue to talk about HCPOA, the importance of naming someone who can speak in his voice.  I encouraged him to continue considering naming a healthcare power of attorney.    We also continued discussions about CODE STATUS, "treat the treatable, but allow a natural passing".  [name] states that at this point he feels that he would not want attempted resuscitation.  I encouraged him to continue these discussions with his family, and in particular his HCPOA.  He states understanding and agreement.  We also talk about the benefits of outpatient palliative and hospice care.  We talked about the support provided by the services for in-home care at no cost.  I shared that palliative services is a monthly visit and hospice provides weekly visits.  [name] shares that he had always thought hospice care was for the dying.  I share that hospice would like to be with him and care for him for an extended period of time.  He is agreeable to start with outpatient palliative services.  Provider choice discussed, he chooses [facility].   Conference with attending, bedside nursing staff, transition of care team related to patient condition, needs, goals of care, disposition.  Plan: At this time, monitoring suspected RCC and bronchogenic neoplasms with no plan for treatment.  Home with the benefits of home health services with [facility] and outpatient palliative services with  [facility].   50 minutes  Johnte Portnoy, NP Palliative medicine team Team phone [phone]

## 2023-05-28 NOTE — Discharge Summary (Signed)
 Physician Discharge Summary  Angel [name] MVH:[id number] DOB: [birth year] DOA: 05/24/2023  PCP: Alfredia Ina, MD  Admit date: 05/24/2023 Discharge date: 05/28/2023  Admitted From: Home Disposition:  Home  Recommendations for Outpatient Follow-up:  Follow up with Pulmonary in 1-2 weeks Please obtain BMP/CBC in one week   Home Health:No Equipment/Devices:None  Discharge Condition:Guarded CODE STATUS:Full Diet recommendation: Heart Healthy  Brief/Interim Summary: 83 y.o. male past medical history significant for pulmonary hypertension, hyperlipidemia chronic respiratory failure with hypoxia on 2 to 3 L of oxygen at baseline comes into the ED for increased shortness of breath started on the day of admission he called EMS sats were 72% on 2 L.  He has been complaining of bilateral lower extremity swelling to the thigh denies any chest pain   Discharge Diagnoses:  Principal Problem:   Acute on chronic diastolic CHF (congestive heart failure) (HCC) Active Problems:   CORONARY ATHEROSCLEROSIS NATIVE CORONARY ARTERY   Right kidney mass   Elevated brain natriuretic peptide (BNP) level   History of pulmonary hypertension   Acute on chronic respiratory failure with hypoxia (HCC)  Acute respiratory failure on chronic with hypoxia secondary to acute on chronic diastolic CHF (congestive heart failure) (HCC) and with bilateral pleural effusion: CT angio of the chest showed spiculated left upper lobe nodule with no significant interval change in 4 months.  No PE and new bilateral pleural effusions. 2D echo was done that showed an EF of 45% global hypokinesia elevated pulmonary pressures and a moderate pericardial effusion IVC dilated with less than 50% respiratory variability. BNP on admission was 1700 Was start her on IV Lasix , he has had brisk diuresis. Continue strict I's and O's and daily weights He has been told to avoid NSAIDs as this can worsen heart failure. He relates he is not taking  anymore Lasix  in the hospital and he will not take Lasix  at home. He would like to go home.  Acute kidney injury: Question cardiorenal syndrome renal dysfunction is improving with IV diuresis. Patient refused IV Lasix  on 05/28/2023, and refusing to take Lasix  at home.   Urinary retention/hematuria: Suspect traumatic in nature. Discontinue Foley will go home on Flomax .   Thrombocytopenia: Continue to monitor as an outpatient. He was no further lab draws.   History of pulmonary hypertension/: 2D echo done October 2024 showed elevated pulmonary pressures. He declined a right heart cath. Follow-up with Dr. Waymond Hailey pulmonary as an outpatient.     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 Repeated CT angio of the chest showed spiculated pulmonary nodule with bilateral pleural effusion no PE. Continue supplemental oxygen follow-up with pulmonary as an outpatient.   CAD: Currently on aspirin , refused statin therapy as an outpatient.   History of known solid right kidney mass: Renal ultrasound showed an 8.8 cm renal mass in the inferior pole consistent with renal cell carcinoma, patient will follow-up with Dr. Claretta Croft as an outpatient. Not a surgical candidate.    History of known spiculated 14 x 12 mm posterior left upper lobe nodule: Was first seen on CT in September 2024, appreciated again on this admission during CT angio of the chest to rule out PE.  Smaller solid pulmonary nodules scattered throughout both lungs. Follow-up with pulmonary for spiculated lung mass.  Discharge Instructions  Discharge Instructions     Diet - low sodium heart healthy   Complete by: As directed    Increase activity slowly  Complete by: As directed    No wound care   Complete by: As directed       Allergies as of 05/28/2023       Reactions   Ciprofloxacin  Other (See Comments)   Crestor [rosuvastatin] Other  (See Comments)   Unknown    Penicillins Other (See Comments)   Per patient causes GI upset with high doses. He says he does OK with shots but gets GI upset with PO penicillins Can take small doses with no issues   Tramadol  Other (See Comments)   Confusion   Lisinopril  Rash, Cough   Patient says it caused bp to increase and caused a rash on his legs    Macrodantin  [nitrofurantoin ] Rash        Medication List     TAKE these medications    aspirin  EC 81 MG tablet Take 81 mg by mouth daily. What changed: Another medication with the same name was removed. Continue taking this medication, and follow the directions you see here.   ibuprofen  200 MG tablet Commonly known as: ADVIL  Take 200 mg by mouth daily as needed for moderate pain (pain score 4-6).   metoprolol  tartrate 25 MG tablet Commonly known as: LOPRESSOR  Take 0.5 tablets (12.5 mg total) by mouth 2 (two) times daily.   nitroGLYCERIN  0.4 MG SL tablet Commonly known as: NITROSTAT  Place 1 tablet (0.4 mg total) under the tongue every 5 (five) minutes x 3 doses as needed for chest pain (if after 3rd dose no relief proceed to ED or call 911).   tamsulosin  0.4 MG Caps capsule Commonly known as: FLOMAX  Take 1 capsule (0.4 mg total) by mouth daily after supper.        Allergies  Allergen Reactions   Ciprofloxacin  Other (See Comments)   Crestor [Rosuvastatin] Other (See Comments)    Unknown    Penicillins Other (See Comments)    Per patient causes GI upset with high doses. He says he does OK with shots but gets GI upset with PO penicillins Can take small doses with no issues   Tramadol  Other (See Comments)    Confusion   Lisinopril  Rash and Cough    Patient says it caused bp to increase and caused a rash on his legs    Macrodantin  [Nitrofurantoin ] Rash    Consultations: None   Procedures/Studies: DG CHEST PORT 1 VIEW Result Date: 05/27/2023 CLINICAL DATA:  Shortness of breath.  Hypoxia EXAM: PORTABLE CHEST 1  VIEW COMPARISON:  X-ray 05/24/2023.  CTA 05/25/2023. FINDINGS: Hyperinflation with chronic changes. More focal opacity in the right lung base with a tiny right effusion, decreasing from previous. Enlarged cardiopericardial silhouette. No pneumothorax or edema. Degenerative changes of the spine. Overlapping cardiac leads. Spiculated left upper lobe nodule is not as well seen on this x-ray. IMPRESSION: Decreased right lung base opacity with small effusion. Recommend continued follow up Spiculated nodule in the left upper lung on prior CT is not well seen on this x-ray. Please correlate with the prior CT recommendations Electronically Signed   By: Adrianna Horde M.D.   On: 05/27/2023 17:18   US  RENAL Result Date: 05/27/2023 CLINICAL DATA:  Right renal mass. EXAM: RENAL / URINARY TRACT ULTRASOUND COMPLETE COMPARISON:  Renal ultrasound 02/26/2023. FINDINGS: Right Kidney: Renal measurements: 9.4 x 5.8 x 5.2 cm = volume: 148 mL. No collecting system dilatation. There is heterogeneous large mass along the lower pole of the right kidney measuring 8.1 cm. Please correlate for any known history. Previously seen at  8.8 cm. Left Kidney: Renal measurements: 9.6 x 4.8 x 3.7 cm = volume: 89 mL. Echogenicity within normal limits. No mass or hydronephrosis visualized. Bladder: Contracted with a Foley catheter. Other: Scattered ascites.  Right-sided pleural effusion. IMPRESSION: No collecting system dilatation of either kidney. Large heterogeneous lobular lower pole right-sided renal mass. Please correlate for known history and any history of a malignant process. Ascites.  Right pleural effusion Electronically Signed   By: Adrianna Horde M.D.   On: 05/27/2023 17:16   CT Angio Chest Pulmonary Embolism (PE) W or WO Contrast Result Date: 05/25/2023 CLINICAL DATA:  Short of breath.  * Tracking Code: BO * EXAM: CT ANGIOGRAPHY CHEST WITH CONTRAST TECHNIQUE: Multidetector CT imaging of the chest was performed using the standard protocol  during bolus administration of intravenous contrast. Multiplanar CT image reconstructions and MIPs were obtained to evaluate the vascular anatomy. RADIATION DOSE REDUCTION: This exam was performed according to the departmental dose-optimization program which includes automated exposure control, adjustment of the mA and/or kV according to patient size and/or use of iterative reconstruction technique. CONTRAST:  75mL OMNIPAQUE  IOHEXOL  350 MG/ML SOLN COMPARISON:  None Available. FINDINGS: Cardiovascular: No filling defects within the pulmonary arteries to suggest acute pulmonary embolism. Mediastinum/Nodes: No axillary or supraclavicular adenopathy. No mediastinal or hilar adenopathy. No pericardial fluid. Esophagus normal. Lungs/Pleura: Persistent spiculated LEFT upper lobe pulmonary nodule measures 14 mm 12 mm (image 39/series 6) compared to 16 mm x 12 mm on prior. Visually lesion appears very similar. Smaller LEFT upper lobe nodule measuring 6 mm (image 45) is unchanged. Several scattered subcentimeter RIGHT lung nodules are unchanged. New layering small to moderate sized RIGHT pleural effusion. Small LEFT pleural effusion. Upper Abdomen: Limited view of the liver, kidneys, pancreas are unremarkable. Normal adrenal glands. Musculoskeletal: No aggressive osseous lesion. Review of the MIP images confirms the above findings. IMPRESSION: 1. Persistent spiculated LEFT upper lobe pulmonary nodule. No significant interval changed in 4 month interval. Lesion remains concerning for bronchogenic carcinoma. Consider FDG PET scan versus tissue sampling. 2. No evidence acute pulmonary embolism. 3. New bilateral pleural effusions. These results will be called to the ordering clinician or representative by the Radiologist Assistant, and communication documented in the PACS or [employer]. Electronically Signed   By: Deboraha Fallow M.D.   On: 05/25/2023 14:26   ECHOCARDIOGRAM COMPLETE Result Date: 05/25/2023     ECHOCARDIOGRAM REPORT   Patient Name:   Angel [name] Date of Exam: 05/25/2023 Medical Rec #:  [id number]     Height:       70.0 in Accession #:    [id number]    Weight:       136.7 lb Date of Birth:  Jan 09, 1941    BSA:          1.776 m Patient Age:    82 years      BP:           115/69 mmHg Patient Gender: M             HR:           81 bpm. Exam Location:  [facility] Procedure: 2D Echo, Cardiac Doppler and Color Doppler Indications:    CHF-Acute Diastolic I50.31  History:        Patient has prior history of Echocardiogram examinations, most                 recent 02/27/2023. Previous Myocardial Infarction and CAD; Risk  Factors:Hypertension and Dyslipidemia.  Sonographer:    Denese Finn RCS Referring Phys: [id number] OLADAPO ADEFESO IMPRESSIONS  1. Left ventricular ejection fraction, by estimation, is 45 to 50%. The left ventricle has mildly decreased function. The left ventricle demonstrates global hypokinesis. Left ventricular diastolic parameters are indeterminate.  2. Right ventricular systolic function is low normal. The right ventricular size is mildly enlarged. There is severely elevated pulmonary artery systolic pressure.  3. Right atrial size was severely dilated.  4. Moderate pericardial effusion. The pericardial effusion is posterior to the left ventricle and the left atrium. There is no evidence of cardiac tamponade.  5. The mitral valve is normal in structure. No evidence of mitral valve regurgitation. No evidence of mitral stenosis.  6. Tricuspid valve regurgitation is mild to moderate.  7. The aortic valve is normal in structure. Aortic valve regurgitation is mild. Aortic valve sclerosis/calcification is present, without any evidence of aortic stenosis.  8. The inferior vena cava is dilated in size with <50% respiratory variability, suggesting right atrial pressure of 15 mmHg. FINDINGS  Left Ventricle: Left ventricular ejection fraction, by estimation, is 45 to 50%. The left ventricle  has mildly decreased function. The left ventricle demonstrates global hypokinesis. The left ventricular internal cavity size was normal in size. There is  no left ventricular hypertrophy. Left ventricular diastolic parameters are indeterminate. Right Ventricle: The right ventricular size is mildly enlarged. No increase in right ventricular wall thickness. Right ventricular systolic function is low normal. There is severely elevated pulmonary artery systolic pressure. The tricuspid regurgitant velocity is 4.23 m/s, and with an assumed right atrial pressure of 15 mmHg, the estimated right ventricular systolic pressure is 86.6 mmHg. Left Atrium: Left atrial size was normal in size. Right Atrium: Right atrial size was severely dilated. Pericardium: A moderately sized pericardial effusion is present. The pericardial effusion is posterior to the left ventricle and the left atrium. There is no evidence of cardiac tamponade. Presence of epicardial fat layer. Mitral Valve: The mitral valve is normal in structure. No evidence of mitral valve regurgitation. No evidence of mitral valve stenosis. Tricuspid Valve: The tricuspid valve is normal in structure. Tricuspid valve regurgitation is mild to moderate. No evidence of tricuspid stenosis. Aortic Valve: The aortic valve is normal in structure. Aortic valve regurgitation is mild. Aortic regurgitation PHT measures 375 msec. Aortic valve sclerosis/calcification is present, without any evidence of aortic stenosis. Pulmonic Valve: The pulmonic valve was normal in structure. Pulmonic valve regurgitation is not visualized. No evidence of pulmonic stenosis. Aorta: The aortic root is normal in size and structure. Venous: The inferior vena cava is dilated in size with less than 50% respiratory variability, suggesting right atrial pressure of 15 mmHg. IAS/Shunts: No atrial level shunt detected by color flow Doppler.  LEFT VENTRICLE PLAX 2D LVIDd:         4.30 cm   Diastology LVIDs:          3.00 cm   LV e' medial:    5.77 cm/s LV PW:         0.90 cm   LV E/e' medial:  18.7 LV IVS:        1.00 cm   LV e' lateral:   6.85 cm/s LVOT diam:     1.90 cm   LV E/e' lateral: 15.8 LV SV:         47 LV SV Index:   26 LVOT Area:     2.84 cm  RIGHT VENTRICLE RV S prime:  9.57 cm/s TAPSE (M-mode): 1.6 cm LEFT ATRIUM             Index        RIGHT ATRIUM           Index LA diam:        3.35 cm 1.89 cm/m   RA Area:     36.90 cm LA Vol (A2C):   32.4 ml 18.25 ml/m  RA Volume:   156.00 ml 87.86 ml/m LA Vol (A4C):   24.6 ml 13.85 ml/m LA Biplane Vol: 29.4 ml 16.56 ml/m  AORTIC VALVE LVOT Vmax:   94.00 cm/s LVOT Vmean:  59.000 cm/s LVOT VTI:    0.165 m AI PHT:      375 msec  AORTA Ao Root diam: 3.30 cm MITRAL VALVE                TRICUSPID VALVE MV Area (PHT): 4.60 cm     TR Peak grad:   71.6 mmHg MV Decel Time: 165 msec     TR Vmax:        423.00 cm/s MV E velocity: 108.00 cm/s                             SHUNTS                             Systemic VTI:  0.16 m                             Systemic Diam: 1.90 cm Kardie Tobb DO Electronically signed by Jerryl Morin DO Signature Date/Time: 05/25/2023/2:20:34 PM    Final    US  Venous Img Lower Bilateral (DVT) Result Date: 05/25/2023 CLINICAL DATA:  Bilateral lower extremity edema EXAM: BILATERAL LOWER EXTREMITY VENOUS DOPPLER ULTRASOUND TECHNIQUE: Gray-scale sonography with graded compression, as well as color Doppler and duplex ultrasound were performed to evaluate the lower extremity deep venous systems from the level of the common femoral vein and including the common femoral, femoral, profunda femoral, popliteal and calf veins including the posterior tibial, peroneal and gastrocnemius veins when visible. The superficial great saphenous vein was also interrogated. Spectral Doppler was utilized to evaluate flow at rest and with distal augmentation maneuvers in the common femoral, femoral and popliteal veins. COMPARISON:  None Available. FINDINGS: RIGHT LOWER  EXTREMITY Common Femoral Vein: No evidence of thrombus. Normal compressibility, respiratory phasicity and response to augmentation. Saphenofemoral Junction: No evidence of thrombus. Normal compressibility and flow on color Doppler imaging. Profunda Femoral Vein: No evidence of thrombus. Normal compressibility and flow on color Doppler imaging. Femoral Vein: No evidence of thrombus. Normal compressibility, respiratory phasicity and response to augmentation. Popliteal Vein: No evidence of thrombus. Normal compressibility, respiratory phasicity and response to augmentation. Calf Veins: No evidence of thrombus. Normal compressibility and flow on color Doppler imaging. Superficial Great Saphenous Vein: No evidence of thrombus. Normal compressibility. Venous Reflux:  None. Other Findings:  Extensive subcutaneous edema. LEFT LOWER EXTREMITY Common Femoral Vein: No evidence of thrombus. Normal compressibility, respiratory phasicity and response to augmentation. Saphenofemoral Junction: No evidence of thrombus. Normal compressibility and flow on color Doppler imaging. Profunda Femoral Vein: No evidence of thrombus. Normal compressibility and flow on color Doppler imaging. Femoral Vein: No evidence of thrombus. Normal compressibility, respiratory phasicity and response to augmentation. Popliteal Vein: No evidence of thrombus. Normal compressibility,  respiratory phasicity and response to augmentation. Calf Veins: No evidence of thrombus. Normal compressibility and flow on color Doppler imaging. Superficial Great Saphenous Vein: No evidence of thrombus. Normal compressibility. Venous Reflux:  None. Other Findings:  Extensive subcutaneous edema. IMPRESSION: No evidence of deep venous thrombosis in either lower extremity. Extensive bilateral lower extremity edema. Electronically Signed   By: Fernando Hoyer M.D.   On: 05/25/2023 13:25   DG Chest Port 1 View Result Date: 05/24/2023 CLINICAL DATA:  Shortness of breath EXAM:  PORTABLE CHEST 1 VIEW COMPARISON:  07/02/2022 FINDINGS: Mild left basilar atelectasis. Mild right basilar scarring, although superimposed pneumonia is possible. No pleural effusion or pneumothorax. Heart is top-normal in size. IMPRESSION: Mild right basilar scarring, superimposed pneumonia not excluded. Electronically Signed   By: Zadie Herter M.D.   On: 05/24/2023 23:12     Subjective: No complaints  Discharge Exam: Vitals:   05/28/23 0448 05/28/23 0813  BP: 111/69 106/71  Pulse: 79 73  Resp: 19   Temp: (!) 97.3 F (36.3 C) (!) 97.3 F (36.3 C)  SpO2: 93% 92%   Vitals:   05/27/23 1426 05/27/23 2050 05/28/23 0448 05/28/23 0813  BP: 108/66 (!) 91/50 111/69 106/71  Pulse: 85 77 79 73  Resp: 19 18 19    Temp:  (!) 97.4 F (36.3 C) (!) 97.3 F (36.3 C) (!) 97.3 F (36.3 C)  TempSrc:  Oral Oral Oral  SpO2: 96% 98% 93% 92%  Weight:      Height:        General: Pt is alert, awake, not in acute distress Cardiovascular: RRR, S1/S2 +, no rubs, no gallops Respiratory: CTA bilaterally, no wheezing, no rhonchi Abdominal: Soft, NT, ND, bowel sounds + Extremities: no edema, no cyanosis    The results of significant diagnostics from this hospitalization (including imaging, microbiology, ancillary and laboratory) are listed below for reference.     Microbiology: No results found for this or any previous visit (from the past 240 hours).   Labs: BNP (last 3 results) Recent Labs    05/24/23 2232 05/27/23 0329  BNP 1,256.0* 1,721.0*   Basic Metabolic Panel: Recent Labs  Lab 05/24/23 2232 05/25/23 0404 05/26/23 0505 05/27/23 0329  NA 135 136 133* 135  K 4.0 3.5 4.6 3.8  CL 107 104 105 105  CO2 23 21* 19* 23  GLUCOSE 97 89 92 80  BUN 20 21 26* 27*  CREATININE 1.24 1.21 1.76* 1.54*  CALCIUM  8.8* 8.7* 8.8* 8.8*  MG  --  1.9 2.1 2.0  PHOS  --  2.6 3.9 3.2   Liver Function Tests: Recent Labs  Lab 05/24/23 2232 05/25/23 0404 05/26/23 0505 05/27/23 0329  AST 15  16 18  12*  ALT 10 8 8 7   ALKPHOS 89 74 65 57  BILITOT 1.8* 2.0* 1.7* 1.4*  PROT 7.0 6.3* 5.9* 5.3*  ALBUMIN  3.5 3.1* 2.9* 2.6*   No results for input(s): "LIPASE", "AMYLASE" in the last 168 hours. No results for input(s): "AMMONIA" in the last 168 hours. CBC: Recent Labs  Lab 05/24/23 2232 05/25/23 0404 05/26/23 0505 05/26/23 1419 05/27/23 0329  WBC 6.2 7.6 9.7  --  8.0  NEUTROABS 4.1  --  7.8*  --  6.1  HGB 17.9* 16.6 16.1 16.3 14.8  HCT 53.2* 48.8 47.1 49.6 45.8  MCV 99.4 98.8 98.3  --  99.8  PLT 158 148* 128*  --  115*   Cardiac Enzymes: No results for input(s): "CKTOTAL", "CKMB", "CKMBINDEX", "TROPONINI" in the  last 168 hours. BNP: Invalid input(s): "POCBNP" CBG: No results for input(s): "GLUCAP" in the last 168 hours. D-Dimer No results for input(s): "DDIMER" in the last 72 hours. Hgb A1c No results for input(s): "HGBA1C" in the last 72 hours. Lipid Profile No results for input(s): "CHOL", "HDL", "LDLCALC", "TRIG", "CHOLHDL", "LDLDIRECT" in the last 72 hours. Thyroid function studies No results for input(s): "TSH", "T4TOTAL", "T3FREE", "THYROIDAB" in the last 72 hours.  Invalid input(s): "FREET3" Anemia work up No results for input(s): "VITAMINB12", "FOLATE", "FERRITIN", "TIBC", "IRON", "RETICCTPCT" in the last 72 hours. Urinalysis    Component Value Date/Time   COLORURINE YELLOW 04/04/2020 1925   APPEARANCEUR Clear 03/05/2023 1439   LABSPEC 1.016 04/04/2020 1925   PHURINE 6.0 04/04/2020 1925   GLUCOSEU Negative 03/05/2023 1439   HGBUR MODERATE (A) 04/04/2020 1925   BILIRUBINUR Negative 03/05/2023 1439   KETONESUR trace (5) (A) 12/03/2020 1104   KETONESUR NEGATIVE 04/04/2020 1925   PROTEINUR 1+ (A) 03/05/2023 1439   PROTEINUR 100 (A) 04/04/2020 1925   UROBILINOGEN 0.2 12/03/2020 1104   UROBILINOGEN 0.2 07/25/2012 0446   NITRITE Negative 03/05/2023 1439   NITRITE NEGATIVE 04/04/2020 1925   LEUKOCYTESUR Negative 03/05/2023 1439   LEUKOCYTESUR LARGE (A)  04/04/2020 1925   Sepsis Labs Recent Labs  Lab 05/24/23 2232 05/25/23 0404 05/26/23 0505 05/27/23 0329  WBC 6.2 7.6 9.7 8.0   Microbiology No results found for this or any previous visit (from the past 240 hours).   Time coordinating discharge: Over 30 minutes  SIGNED:   Macdonald Savoy, MD  [facility] 05/28/2023, 10:07 AM Pager   If 7PM-7AM, please contact night-coverage [email or web address] Password [email or web address]

## 2023-05-28 NOTE — Progress Notes (Signed)
 Nurse at bedside, Blood pressure 95/62,heart rate 72, holding the Metoprolol ,Dr Versa Gore notified. 16 french foley discontinued, per Dr Claretta Croft orders.Patient tolerated removal some small amount of blood drainage noted. Plan of care on going.

## 2023-05-28 NOTE — TOC Transition Note (Signed)
 Transition of Care [facility]) - Discharge Note   Patient Details  Name: Angel [name] MRN: [id number] Date of Birth: [birth year]  Transition of Care [facility]) CM/SW Contact:  Orelia Binet, RN Phone Number: 05/28/2023, 11:19 AM   Clinical Narrative:  Patient discharging home. Palliative is recommending Outpatient palliative. Patient is agreeable and requested [facility]. Referral made. PT is recommending HHPT. Patient also agreeable, no preferences.  Referral sent out [facility] with [facility] accepted. MD aware to order HHPT.    Final next level of care: Home w Home Health Services Barriers to Discharge: Barriers Resolved   Patient Goals and CMS Choice Patient states their goals for this hospitalization and ongoing recovery are:: to go home CMS Medicare.gov Compare Post Acute Care list provided to:: Patient Choice offered to / list presented to : Patient [place] ownership interest in [facility].provided to:: Patient   Discharge Placement                    Patient and family notified of of transfer: 05/28/23  Discharge Plan and Services Additional resources added to the After Visit Summary for         [facility] Arranged: PT [facility] Agency: [facility] Date HH Agency Contacted: 05/28/23 Time HH Agency Contacted: 1118 Representative spoke with at [facility] Agency: Renetta Carter  Social Drivers of Health (SDOH) Interventions SDOH Screenings   Food Insecurity: No Food Insecurity (05/25/2023)  Housing: Low Risk  (05/25/2023)  Transportation Needs: No Transportation Needs (05/25/2023)  Utilities: Not At Risk (05/25/2023)  Financial Resource Strain: Low Risk  (10/28/2022)   Received from [facility], [facility]  Physical Activity: Unknown (10/28/2022)   Received from [facility]  Recent Concern: Physical Activity - Inactive (10/28/2022)   Received from [facility], [facility]  Social Connections: Socially Isolated (05/25/2023)  Stress: No Stress Concern Present  (10/28/2022)   [facility]  Tobacco Use: Medium Risk (05/27/2023)     Readmission Risk Interventions    05/28/2023   11:19 AM  Readmission Risk Prevention Plan  Post Dischage Appt Not Complete  Medication Screening Complete  Transportation Screening Complete

## 2023-05-28 NOTE — Progress Notes (Signed)
[facility] PROGRESS NOTE    Progress Note  Angel [name]  UXL:[id number] DOB: 12-Apr-1941 DOA: 05/24/2023 PCP: Tracey Harries, MD     Brief Narrative:   Angel [name] is an 83 y.o. male past medical history significant for pulmonary hypertension, hyperlipidemia chronic respiratory failure with hypoxia on 2 to 3 L of oxygen at baseline comes into the ED for increased shortness of breath started on the day of admission he called EMS sats were 72% on 2 L.  He has been complaining of bilateral lower extremity swelling to the thigh denies any chest pain. CT angio of the chest showed spiculated left upper lobe nodule with no significant interval change in 4 months.  No PE and new bilateral pleural effusions.   Assessment/Plan:   Acute respiratory failure on chronic with hypoxia secondary to acute on chronic diastolic CHF (congestive heart failure) (HCC) and with bilateral pleural effusion: 2D echo was done that showed an EF of 45% global hypokinesia elevated pulmonary pressures and a moderate pericardial effusion IVC dilated with less than 50% respiratory variability. BNP on admission was 1700 He agreed to IV Lasix he was restarted on IV Lasix is negative today about 6 L. Continue strict I's and O's and daily weights Transition to oral Lasix. Awaiting basic metabolic panel.  Acute kidney injury: Question cardiorenal syndrome renal dysfunction is improving with IV diuresis. Transition to oral Lasix.  Acute urinary retention due to BPH/hematuria: Material has resolved currently on Proscar and Flomax. He failed voiding trial will need to go home with Foley and follow-up with urology as an outpatient.  Thrombocytopenia: Discontinue heparin. Continue to monitor  History of pulmonary hypertension/: 2D echo done October 2024 showed elevated pulmonary pressures. He declined a right heart cath. Follow-up with Dr. Sherene Sires pulmonary as an outpatient.  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 Repeated CT angio of the chest showed spiculated pulmonary nodule with bilateral pleural effusion no PE. Continue supplemental oxygen follow-up with pulmonary as an outpatient.  CAD: Currently on aspirin, refused statin therapy as an outpatient.  History of known solid right kidney mass: Renal ultrasound showed an 8.8 cm renal mass in the inferior pole consistent with renal cell carcinoma, patient will follow-up with Dr. Ronne Binning as an outpatient. Not a surgical candidate.    History of known spiculated 14 x 12 mm posterior left upper lobe nodule: Was first seen on CT in September 2024, appreciated again on this admission during CT angio of the chest to rule out PE.  Smaller solid pulmonary nodules scattered throughout both lungs. Follow-up with pulmonary for spiculated lung mass.  Surgery decubitus ulcer present on admission: RN Pressure Injury Documentation: Pressure Injury 05/27/23 Buttocks (Active)  05/27/23   Location: Buttocks  Location Orientation:   Staging:   Wound Description (Comments):   Present on Admission:   Dressing Type Foam - Lift dressing to assess site every shift 05/27/23 2202     DVT prophylaxis: SCD Family Communication:none Status is: Inpatient Remains inpatient appropriate because: Acute respiratory failure with hypoxia due to acute diastolic dysfunction    Code Status:     Code Status Orders  (From admission, onward)           Start     Ordered   05/25/23 0304  Full code  Continuous       Question:  By:  Answer:  Consent: discussion documented in EHR   05/25/23 0305  Code Status History     Date Active Date Inactive Code Status Order ID Comments User Context   02/24/2017 1638 02/26/2017 1332 Full Code [id number]  Malen Gauze, MD Inpatient         IV Access:   Peripheral IV   Procedures and diagnostic studies:    DG CHEST PORT 1 VIEW Result Date: 05/27/2023 CLINICAL DATA:  Shortness of breath.  Hypoxia EXAM: PORTABLE CHEST 1 VIEW COMPARISON:  X-ray 05/24/2023.  CTA 05/25/2023. FINDINGS: Hyperinflation with chronic changes. More focal opacity in the right lung base with a tiny right effusion, decreasing from previous. Enlarged cardiopericardial silhouette. No pneumothorax or edema. Degenerative changes of the spine. Overlapping cardiac leads. Spiculated left upper lobe nodule is not as well seen on this x-ray. IMPRESSION: Decreased right lung base opacity with small effusion. Recommend continued follow up Spiculated nodule in the left upper lung on prior CT is not well seen on this x-ray. Please correlate with the prior CT recommendations Electronically Signed   By: Karen Kays M.D.   On: 05/27/2023 17:18   US RENAL Result Date: 05/27/2023 CLINICAL DATA:  Right renal mass. EXAM: RENAL / URINARY TRACT ULTRASOUND COMPLETE COMPARISON:  Renal ultrasound 02/26/2023. FINDINGS: Right Kidney: Renal measurements: 9.4 x 5.8 x 5.2 cm = volume: 148 mL. No collecting system dilatation. There is heterogeneous large mass along the lower pole of the right kidney measuring 8.1 cm. Please correlate for any known history. Previously seen at 8.8 cm. Left Kidney: Renal measurements: 9.6 x 4.8 x 3.7 cm = volume: 89 mL. Echogenicity within normal limits. No mass or hydronephrosis visualized. Bladder: Contracted with a Foley catheter. Other: Scattered ascites.  Right-sided pleural effusion. IMPRESSION: No collecting system dilatation of either kidney. Large heterogeneous lobular lower pole right-sided renal mass. Please correlate for known history and any history of a malignant process. Ascites.  Right pleural effusion Electronically Signed   By: Karen Kays M.D.   On: 05/27/2023 17:16     Medical Consultants:   None.   Subjective:    Angel [name] he relates he does not feel good today has been having watery diarrhea, after  MiraLAX was started.  Objective:    Vitals:   05/27/23 1426 05/27/23 2050 05/28/23 0448 05/28/23 0813  BP: 108/66 (!) 91/50 111/69 106/71  Pulse: 85 77 79 73  Resp: 19 18 19    Temp:  (!) 97.4 F (36.3 C) (!) 97.3 F (36.3 C) (!) 97.3 F (36.3 C)  TempSrc:  Oral Oral Oral  SpO2: 96% 98% 93% 92%  Weight:      Height:       SpO2: 92 % O2 Flow Rate (L/min): 5 L/min FiO2 (%): 40 %   Intake/Output Summary (Last 24 hours) at 05/28/2023 0922 Last data filed at 05/28/2023 0448 Gross per 24 hour  Intake --  Output 3300 ml  Net -3300 ml   Filed Weights   05/24/23 2240 05/26/23 0511 05/27/23 0419  Weight: 62 kg 53.3 kg 64.5 kg    Exam: General exam: In no acute distress. Respiratory system: Good air movement and clear to auscultation. Cardiovascular system: S1 & S2 heard, RRR. No JVD. Gastrointestinal system: Abdomen is nondistended, soft and nontender.  Extremities: No pedal edema. Skin: No rashes, lesions or ulcers Psychiatry: Judgement and insight appear normal. Mood & affect appropriate.  Data Reviewed:    Labs: Basic Metabolic Panel: Recent Labs  [name] 05/24/23 2232 05/25/23 0404 05/26/23 0505 05/27/23 0329  NA 135 136  133* 135  K 4.0 3.5 4.6 3.8  CL 107 104 105 105  CO2 23 21* 19* 23  GLUCOSE 97 89 92 80  BUN 20 21 26* 27*  CREATININE 1.24 1.21 1.76* 1.54*  CALCIUM 8.8* 8.7* 8.8* 8.8*  MG  --  1.9 2.1 2.0  PHOS  --  2.6 3.9 3.2   GFR Estimated Creatinine Clearance: 33.2 mL/min (A) (by C-G formula based on SCr of 1.54 mg/dL (H)). Liver Function Tests: Recent Labs  [name] 05/24/23 2232 05/25/23 0404 05/26/23 0505 05/27/23 0329  AST 15 16 18  12*  ALT 10 8 8 7   ALKPHOS 89 74 65 57  BILITOT 1.8* 2.0* 1.7* 1.4*  PROT 7.0 6.3* 5.9* 5.3*  ALBUMIN 3.5 3.1* 2.9* 2.6*   No results for input(s): "LIPASE", "AMYLASE" in the last 168 hours. No results for input(s): "AMMONIA" in the last 168 hours. Coagulation profile No results for input(s): "INR", "PROTIME"  in the last 168 hours. COVID-19 Labs  No results for input(s): "DDIMER", "FERRITIN", "LDH", "CRP" in the last 72 hours.  [name] Results  Component Value Date   SARSCOV2NAA NEGATIVE 10/16/2020   SARSCOV2NAA NEGATIVE 09/25/2020    CBC: Recent Labs  [name] 05/24/23 2232 05/25/23 0404 05/26/23 0505 05/26/23 1419 05/27/23 0329  WBC 6.2 7.6 9.7  --  8.0  NEUTROABS 4.1  --  7.8*  --  6.1  HGB 17.9* 16.6 16.1 16.3 14.8  HCT 53.2* 48.8 47.1 49.6 45.8  MCV 99.4 98.8 98.3  --  99.8  PLT 158 148* 128*  --  115*   Cardiac Enzymes: No results for input(s): "CKTOTAL", "CKMB", "CKMBINDEX", "TROPONINI" in the last 168 hours. BNP (last 3 results) No results for input(s): "PROBNP" in the last 8760 hours. CBG: No results for input(s): "GLUCAP" in the last 168 hours. D-Dimer: No results for input(s): "DDIMER" in the last 72 hours. Hgb A1c: No results for input(s): "HGBA1C" in the last 72 hours. Lipid Profile: No results for input(s): "CHOL", "HDL", "LDLCALC", "TRIG", "CHOLHDL", "LDLDIRECT" in the last 72 hours. Thyroid function studies: No results for input(s): "TSH", "T4TOTAL", "T3FREE", "THYROIDAB" in the last 72 hours.  Invalid input(s): "FREET3" Anemia work up: No results for input(s): "VITAMINB12", "FOLATE", "FERRITIN", "TIBC", "IRON", "RETICCTPCT" in the last 72 hours. Sepsis Labs: Recent Labs  [name] 05/24/23 2232 05/25/23 0404 05/26/23 0505 05/27/23 0329  WBC 6.2 7.6 9.7 8.0   Microbiology No results found for this or any previous visit (from the past 240 hours).   Medications:    Chlorhexidine Gluconate Cloth  6 each Topical Daily   finasteride  5 mg Oral Daily   furosemide  40 mg Intravenous BID   tamsulosin  0.4 mg Oral QPC supper   Continuous Infusions:    LOS: 4 days   Marinda Elk  [facility]  05/28/2023, 9:22 AM

## 2023-05-28 NOTE — Progress Notes (Signed)
 Nurse at bedside,patient has not voided yet,not c/o pain noted,a bladder scan was done and showed 265 ml's,Dr Feliz-Ortiz notified. Plan of care on going,

## 2023-05-29 DIAGNOSIS — I5033 Acute on chronic diastolic (congestive) heart failure: Secondary | ICD-10-CM | POA: Diagnosis not present

## 2023-05-29 DIAGNOSIS — Z7189 Other specified counseling: Secondary | ICD-10-CM | POA: Diagnosis not present

## 2023-05-29 DIAGNOSIS — Z515 Encounter for palliative care: Secondary | ICD-10-CM | POA: Diagnosis not present

## 2023-05-29 LAB — BASIC METABOLIC PANEL
Anion gap: 12 (ref 5–15)
BUN: 27 mg/dL — ABNORMAL HIGH (ref 8–23)
CO2: 27 mmol/L (ref 22–32)
Calcium: 8.9 mg/dL (ref 8.9–10.3)
Chloride: 94 mmol/L — ABNORMAL LOW (ref 98–111)
Creatinine, Ser: 1.45 mg/dL — ABNORMAL HIGH (ref 0.61–1.24)
GFR, Estimated: 48 mL/min — ABNORMAL LOW (ref >60)
Glucose, Bld: 101 mg/dL — ABNORMAL HIGH (ref 70–99)
Potassium: 3.5 mmol/L (ref 3.5–5.1)
Sodium: 133 mmol/L — ABNORMAL LOW (ref 135–145)

## 2023-05-29 LAB — CBC
HCT: 48.6 % (ref 39.0–52.0)
Hemoglobin: 16.6 g/dL (ref 13.0–17.0)
MCH: 33.6 pg (ref 26.0–34.0)
MCHC: 34.2 g/dL (ref 30.0–36.0)
MCV: 98.4 fL (ref 80.0–100.0)
Platelets: 92 10*3/uL — ABNORMAL LOW (ref 150–400)
RBC: 4.94 MIL/uL (ref 4.22–5.81)
RDW: 14.5 % (ref 11.5–15.5)
WBC: 7 10*3/uL (ref 4.0–10.5)
nRBC: 0 % (ref 0.0–0.2)

## 2023-05-29 LAB — BRAIN NATRIURETIC PEPTIDE: B Natriuretic Peptide: 1068 pg/mL — ABNORMAL HIGH (ref 0.0–100.0)

## 2023-05-29 MED ORDER — ENOXAPARIN SODIUM 40 MG/0.4ML IJ SOSY
40.0000 mg | PREFILLED_SYRINGE | INTRAMUSCULAR | Status: DC
  Administered 2023-05-29 – 2023-05-31 (×3): 40 mg via SUBCUTANEOUS
  Filled 2023-05-29 (×3): qty 0.4

## 2023-05-29 MED ORDER — FUROSEMIDE 20 MG PO TABS: 20.0000 mg | ORAL_TABLET | Freq: Every day | ORAL | Status: DC

## 2023-05-29 MED ORDER — TRAMADOL HCL 50 MG PO TABS: 50.0000 mg | ORAL_TABLET | Freq: Four times a day (QID) | ORAL | Status: DC | PRN

## 2023-05-29 MED ORDER — POTASSIUM CHLORIDE CRYS ER 20 MEQ PO TBCR
40.0000 meq | EXTENDED_RELEASE_TABLET | Freq: Once | ORAL | Status: AC
  Administered 2023-05-29: 40 meq via ORAL
  Filled 2023-05-29: qty 2

## 2023-05-29 NOTE — Progress Notes (Signed)
Palliative: Angel [name] is lying quietly in bed.  He appears acutely/chronically ill and frail.  He is resting comfortably, but wakes easily when I enter.  He greets me, making and mostly keeping eye contact.  He is alert and oriented, able to make his needs known.  There is no family at bedside at this time.  We talked about his acute health concerns, fluid overload, and the treatment plan.  We talked about urology removing catheter.  At this point, Angel [name] has been qualified for home health services. He endorses that he is quite weak.  Transition of care team is working closely with patient and family for needs.  Face-to-face discussion with bedside nursing staff and transition of care team related to patient condition, needs, goals of care, disposition.  Plan: At this point continue to treat the treatable.  Continues to consider [facility] outpatient palliative services for further goals of care discussions.  Would benefit to transition to "treat the treatable" hospice care soon.  Home with home health.  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    25 minutes  Lillia Carmel, NP Palliative medicine team Team phone [phone]

## 2023-05-29 NOTE — Plan of Care (Signed)
  Problem: Education: Goal: Knowledge of General Education information will improve Description: Including pain rating scale, medication(s)/side effects and non-pharmacologic comfort measures Outcome: Progressing   Problem: Coping: Goal: Level of anxiety will decrease Outcome: Not Progressing

## 2023-05-29 NOTE — Progress Notes (Addendum)
Pt found lying in floor bedside his bed. Pt states he was trying to walk to the bathroom with no assistance. Denies hitting head at time of fall. VS wnl. MD notified.  Rushie Goltz, pt son that is his contact made aware. Bed in lowest position. Call bell in reach.

## 2023-05-29 NOTE — Evaluation (Signed)
Occupational Therapy Evaluation Patient Details Name: Angel [name] MRN: [id number] DOB: 08-Dec-1940 Today's Date: 05/29/2023   History of Present Illness 83 y.o. male past medical history significant for pulmonary hypertension, hyperlipidemia chronic respiratory failure with hypoxia on 2 to 3 L of oxygen at baseline comes into the ED for increased shortness of breath started on the day of admission he called EMS sats were 72% on 2 L.  He has been complaining of bilateral lower extremity swelling to the thigh denies any chest pain   Clinical Impression   Pt agreeable to OT evaluation. Pt is independent at baseline and living alone. Able to complete bed mobility and transfers without AD with mod I level of assist just due to labored effort. Pt is generally weak in B UE and was open to strengthening at home. Pt is not recommended for further acute OT services and will be discharged to care of nursing staff for the remainder of his stay.               Functional Status Assessment  Patient has had a recent decline in their functional status and demonstrates the ability to make significant improvements in function in a reasonable and predictable amount of time.  Equipment Recommendations  None recommended by OT           Precautions / Restrictions Precautions Precautions: Fall Restrictions Weight Bearing Restrictions Per Provider Order: No      Mobility Bed Mobility Overal bed mobility: Modified Independent             General bed mobility comments: labored    Transfers Overall transfer level: Modified independent                 General transfer comment: able to transfer chair to bed and back without AD and no physical assist.      Balance Overall balance assessment: Mild deficits observed, not formally tested                                         ADL either performed or assessed with clinical judgement   ADL Overall ADL's : Modified  independent                                       General ADL Comments: Able to doff and don sock seated at EOB without assist.     Vision Baseline Vision/History: 4 Cataracts Ability to See in Adequate Light: 1 Impaired Patient Visual Report: No change from baseline Vision Assessment?: No apparent visual deficits     Perception Perception: Not tested       Praxis Praxis: Not tested       Pertinent Vitals/Pain Pain Assessment Pain Assessment: 0-10 Pain Score: 8  Pain Location: bladder and rectum Pain Descriptors / Indicators: Constant, Aching Pain Intervention(s): Limited activity within patient's tolerance, Monitored during session, Repositioned     Extremity/Trunk Assessment Upper Extremity Assessment Upper Extremity Assessment: Generalized weakness (3-/5 MMT shoulder flexion; WFL P/ROM for shoulder flexion indicating weakness.)   Lower Extremity Assessment Lower Extremity Assessment: Defer to PT evaluation   Cervical / Trunk Assessment Cervical / Trunk Assessment: Kyphotic   Communication Communication Communication: No apparent difficulties   Cognition Arousal: Alert Behavior During Therapy: WFL for tasks assessed/performed Overall Cognitive Status: Within Functional Limits  for tasks assessed                                                        Home Living Family/patient expects to be discharged to:: Private residence Living Arrangements: Alone   Type of Home: House Home Access: Stairs to enter [employer] of Steps: 5 Entrance Stairs-Rails: Can reach both Home Layout: One level     Bathroom Shower/Tub: Producer, television/film/video: Standard     Home Equipment: Agricultural consultant (2 wheels);Shower seat          Prior Functioning/Environment Prior Level of Function : Independent/Modified Independent             Mobility Comments: No AD with ambulation ADLs Comments: independent          End of Session Equipment Utilized During Treatment: Oxygen  Activity Tolerance: Patient tolerated treatment well Patient left: in chair;with call bell/phone within reach;with nursing/sitter in room  OT Visit Diagnosis: Unsteadiness on feet (R26.81);Other abnormalities of gait and mobility (R26.89);Muscle weakness (generalized) (M62.81)                Time: 0454-0981 OT Time Calculation (min): 12 min Charges:  OT General Charges $OT Visit: 1 Visit OT Evaluation $OT Eval Low Complexity: 1 Low  Naoki Migliaccio OT, MOT  Danie Chandler 05/29/2023, 11:47 AM

## 2023-05-29 NOTE — Plan of Care (Signed)

## 2023-05-30 DIAGNOSIS — J810 Acute pulmonary edema: Secondary | ICD-10-CM | POA: Diagnosis not present

## 2023-05-30 DIAGNOSIS — I272 Pulmonary hypertension, unspecified: Secondary | ICD-10-CM | POA: Diagnosis not present

## 2023-05-30 DIAGNOSIS — I5033 Acute on chronic diastolic (congestive) heart failure: Secondary | ICD-10-CM | POA: Diagnosis not present

## 2023-05-30 DIAGNOSIS — J9601 Acute respiratory failure with hypoxia: Secondary | ICD-10-CM | POA: Diagnosis not present

## 2023-05-30 LAB — BASIC METABOLIC PANEL
Anion gap: 9 (ref 5–15)
BUN: 29 mg/dL — ABNORMAL HIGH (ref 8–23)
CO2: 26 mmol/L (ref 22–32)
Calcium: 8.8 mg/dL — ABNORMAL LOW (ref 8.9–10.3)
Chloride: 99 mmol/L (ref 98–111)
Creatinine, Ser: 1.57 mg/dL — ABNORMAL HIGH (ref 0.61–1.24)
GFR, Estimated: 43 mL/min — ABNORMAL LOW (ref >60)
Glucose, Bld: 87 mg/dL (ref 70–99)
Potassium: 4 mmol/L (ref 3.5–5.1)
Sodium: 134 mmol/L — ABNORMAL LOW (ref 135–145)

## 2023-05-30 LAB — HEPATIC FUNCTION PANEL
ALT: 6 U/L (ref 0–44)
AST: 13 U/L — ABNORMAL LOW (ref 15–41)
Albumin: 3 g/dL — ABNORMAL LOW (ref 3.5–5.0)
Alkaline Phosphatase: 52 U/L (ref 38–126)
Bilirubin, Direct: 0.5 mg/dL — ABNORMAL HIGH (ref 0.0–0.2)
Indirect Bilirubin: 1.4 mg/dL — ABNORMAL HIGH (ref 0.3–0.9)
Total Bilirubin: 1.9 mg/dL — ABNORMAL HIGH (ref 0.0–1.2)
Total Protein: 5.5 g/dL — ABNORMAL LOW (ref 6.5–8.1)

## 2023-05-30 MED ORDER — FUROSEMIDE 40 MG PO TABS: 40.0000 mg | ORAL_TABLET | Freq: Every day | ORAL | 1 refills | Status: DC

## 2023-05-30 MED ORDER — FUROSEMIDE 40 MG PO TABS
40.0000 mg | ORAL_TABLET | Freq: Two times a day (BID) | ORAL | Status: DC
  Administered 2023-05-30 (×2): 40 mg via ORAL
  Filled 2023-05-30 (×2): qty 1

## 2023-05-30 NOTE — Care Management Important Message (Signed)
Important Message  Patient Details  Name: Angel [name] MRN: [id number] Date of Birth: 09-29-1940   Important Message Given:  Yes - Medicare IM     Corey Harold 05/30/2023, 1:05 PM

## 2023-05-30 NOTE — Discharge Summary (Signed)
Physician Discharge Summary  Angel [name] ZOX:[id number] DOB: 1940/12/10 DOA: 05/24/2023  PCP: Tracey Harries, MD  Admit date: 05/24/2023 Discharge date: 05/30/2023  Admitted From: Home Disposition:  Home  Recommendations for Outpatient Follow-up:  Follow up with Pulmonary in 1-2 weeks Please obtain BMP/CBC in one week   Home Health:No Equipment/Devices:None  Discharge Condition:Guarded CODE STATUS:Full Diet recommendation: Heart Healthy  Brief/Interim Summary: 83 y.o. male past medical history significant for pulmonary hypertension, hyperlipidemia chronic respiratory failure with hypoxia on 2 to 3 L of oxygen at baseline comes into the ED for increased shortness of breath started on the day of admission he called EMS sats were 72% on 2 L.  He has been complaining of bilateral lower extremity swelling to the thigh denies any chest pain   Discharge Diagnoses:  Principal Problem:   Acute on chronic diastolic CHF (congestive heart failure) (HCC) Active Problems:   CORONARY ATHEROSCLEROSIS NATIVE CORONARY ARTERY   Right kidney mass   Elevated brain natriuretic peptide (BNP) level   History of pulmonary hypertension   Acute on chronic respiratory failure with hypoxia (HCC)  Acute respiratory failure on chronic with hypoxia secondary to acute on chronic diastolic CHF (congestive heart failure) (HCC) and with bilateral pleural effusion: CT angio of the chest showed spiculated left upper lobe nodule with no significant interval change in 4 months.  No PE and new bilateral pleural effusions. 2D echo was done that showed an EF of 45% global hypokinesia elevated pulmonary pressures and a moderate pericardial effusion IVC dilated with less than 50% respiratory variability. BNP on admission was 1700 Will start on IV Lasix and he diuresed about 8 L. Continue strict I's and O's and daily weights He has been told to avoid NSAIDs as this can worsen heart failure. He is willing to take Lasix at  home we will put it at 40 mg daily follow-up with cardiology and pulmonary as an outpatient.  Acute kidney injury: Question cardiorenal syndrome renal dysfunction is improving with IV diuresis. Diuresed well his creatinine improved will need to continue Lasix as an outpatient.   Urinary retention/hematuria: Suspect traumatic in nature. He was given a voiding trial which he failed. Foley had to be placed back in he will follow-up with urology as an outpatient he will go home on Flomax and finasteride.   Thrombocytopenia: Continue to monitor as an outpatient. He was no further lab draws.   History of pulmonary hypertension: 2D echo done October 2024 showed elevated pulmonary pressures. He declined a right heart cath. Follow-up with Dr. Sherene Sires pulmonary as an outpatient.     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 Repeated CT angio of the chest showed spiculated pulmonary nodule with bilateral pleural effusion no PE. Continue supplemental oxygen follow-up with pulmonary as an outpatient.   CAD: Currently on aspirin, refused statin therapy as an outpatient.   History of known solid right kidney mass: Renal ultrasound showed an 8.8 cm renal mass in the inferior pole consistent with renal cell carcinoma, patient will follow-up with Dr. Ronne Binning as an outpatient. Not a surgical candidate.    History of known spiculated 14 x 12 mm posterior left upper lobe nodule: Was first seen on CT in September 2024, appreciated again on this admission during CT angio of the chest to rule out PE.  Smaller solid pulmonary nodules scattered throughout both lungs. Follow-up with pulmonary for spiculated lung mass.  Discharge Instructions  Discharge  Instructions     Diet - low sodium heart healthy   Complete by: As directed    Diet - low sodium heart healthy   Complete by: As directed    Increase activity  slowly   Complete by: As directed    Increase activity slowly   Complete by: As directed    No wound care   Complete by: As directed    No wound care   Complete by: As directed       Allergies as of 05/30/2023       Reactions   Ciprofloxacin Other (See Comments)   Crestor [rosuvastatin] Other (See Comments)   Unknown    Penicillins Other (See Comments)   Per patient causes GI upset with high doses. He says he does OK with shots but gets GI upset with PO penicillins Can take small doses with no issues   Tramadol Other (See Comments)   Confusion   Lisinopril Rash, Cough   Patient says it caused bp to increase and caused a rash on his legs    Macrodantin [nitrofurantoin] Rash        Medication List     TAKE these medications    aspirin EC 81 MG tablet Take 81 mg by mouth daily. What changed: Another medication with the same name was removed. Continue taking this medication, and follow the directions you see here.   furosemide 40 MG tablet Commonly known as: LASIX Take 1 tablet (40 mg total) by mouth daily.   ibuprofen 200 MG tablet Commonly known as: ADVIL Take 200 mg by mouth daily as needed for moderate pain (pain score 4-6).   metoprolol tartrate 25 MG tablet Commonly known as: LOPRESSOR Take 0.5 tablets (12.5 mg total) by mouth 2 (two) times daily.   nitroGLYCERIN 0.4 MG SL tablet Commonly known as: NITROSTAT Place 1 tablet (0.4 mg total) under the tongue every 5 (five) minutes x 3 doses as needed for chest pain (if after 3rd dose no relief proceed to ED or call 911).   tamsulosin 0.4 MG Caps capsule Commonly known as: FLOMAX Take 1 capsule (0.4 mg total) by mouth daily after supper.         Follow-up Information     [facility] Follow up.   Why: PT will call to schedule your first home visit. Contact information: [address] [place] [address] [phone]         [facility] Follow up.   Why: Palliative nurse  will call to schedule first appointment               Allergies  Allergen Reactions   Ciprofloxacin Other (See Comments)   Crestor [Rosuvastatin] Other (See Comments)    Unknown    Penicillins Other (See Comments)    Per patient causes GI upset with high doses. He says he does OK with shots but gets GI upset with PO penicillins Can take small doses with no issues   Tramadol Other (See Comments)    Confusion   Lisinopril Rash and Cough    Patient says it caused bp to increase and caused a rash on his legs    Macrodantin [Nitrofurantoin] Rash    Consultations: None   Procedures/Studies: DG CHEST PORT 1 VIEW Result Date: 05/27/2023 CLINICAL DATA:  Shortness of breath.  Hypoxia EXAM: PORTABLE CHEST 1 VIEW COMPARISON:  X-ray 05/24/2023.  CTA 05/25/2023. FINDINGS: Hyperinflation with chronic changes. More focal opacity in the right lung base  with a tiny right effusion, decreasing from previous. Enlarged cardiopericardial silhouette. No pneumothorax or edema. Degenerative changes of the spine. Overlapping cardiac leads. Spiculated left upper lobe nodule is not as well seen on this x-ray. IMPRESSION: Decreased right lung base opacity with small effusion. Recommend continued follow up Spiculated nodule in the left upper lung on prior CT is not well seen on this x-ray. Please correlate with the prior CT recommendations Electronically Signed   By: Karen Kays M.D.   On: 05/27/2023 17:18   US RENAL Result Date: 05/27/2023 CLINICAL DATA:  Right renal mass. EXAM: RENAL / URINARY TRACT ULTRASOUND COMPLETE COMPARISON:  Renal ultrasound 02/26/2023. FINDINGS: Right Kidney: Renal measurements: 9.4 x 5.8 x 5.2 cm = volume: 148 mL. No collecting system dilatation. There is heterogeneous large mass along the lower pole of the right kidney measuring 8.1 cm. Please correlate for any known history. Previously seen at 8.8 cm. Left Kidney: Renal measurements: 9.6 x 4.8 x 3.7 cm = volume: 89 mL. Echogenicity  within normal limits. No mass or hydronephrosis visualized. Bladder: Contracted with a Foley catheter. Other: Scattered ascites.  Right-sided pleural effusion. IMPRESSION: No collecting system dilatation of either kidney. Large heterogeneous lobular lower pole right-sided renal mass. Please correlate for known history and any history of a malignant process. Ascites.  Right pleural effusion Electronically Signed   By: Karen Kays M.D.   On: 05/27/2023 17:16   CT Angio Chest Pulmonary Embolism (PE) W or WO Contrast Result Date: 05/25/2023 CLINICAL DATA:  Short of breath.  * Tracking Code: BO * EXAM: CT ANGIOGRAPHY CHEST WITH CONTRAST TECHNIQUE: Multidetector CT imaging of the chest was performed using the standard protocol during bolus administration of intravenous contrast. Multiplanar CT image reconstructions and MIPs were obtained to evaluate the vascular anatomy. RADIATION DOSE REDUCTION: This exam was performed according to the departmental dose-optimization program which includes automated exposure control, adjustment of the mA and/or kV according to patient size and/or use of iterative reconstruction technique. CONTRAST:  75mL OMNIPAQUE IOHEXOL 350 MG/ML SOLN COMPARISON:  None Available. FINDINGS: Cardiovascular: No filling defects within the pulmonary arteries to suggest acute pulmonary embolism. Mediastinum/Nodes: No axillary or supraclavicular adenopathy. No mediastinal or hilar adenopathy. No pericardial fluid. Esophagus normal. Lungs/Pleura: Persistent spiculated LEFT upper lobe pulmonary nodule measures 14 mm 12 mm (image 39/series 6) compared to 16 mm x 12 mm on prior. Visually lesion appears very similar. Smaller LEFT upper lobe nodule measuring 6 mm (image 45) is unchanged. Several scattered subcentimeter RIGHT lung nodules are unchanged. New layering small to moderate sized RIGHT pleural effusion. Small LEFT pleural effusion. Upper Abdomen: Limited view of the liver, kidneys, pancreas are  unremarkable. Normal adrenal glands. Musculoskeletal: No aggressive osseous lesion. Review of the MIP images confirms the above findings. IMPRESSION: 1. Persistent spiculated LEFT upper lobe pulmonary nodule. No significant interval changed in 4 month interval. Lesion remains concerning for bronchogenic carcinoma. Consider FDG PET scan versus tissue sampling. 2. No evidence acute pulmonary embolism. 3. New bilateral pleural effusions. These results will be called to the ordering clinician or representative by the Radiologist Assistant, and communication documented in the PACS or [employer]. Electronically Signed   By: Genevive Bi M.D.   On: 05/25/2023 14:26   ECHOCARDIOGRAM COMPLETE Result Date: 05/25/2023    ECHOCARDIOGRAM REPORT   Patient Name:   Ramonita Lab Date of Exam: 05/25/2023 Medical Rec #:  [id number]     Height:       70.0 in Accession #:  [id number]    Weight:       136.7 lb Date of Birth:  1941/03/08    BSA:          1.776 m Patient Age:    82 years      BP:           115/69 mmHg Patient Gender: M             HR:           81 bpm. [facility] Procedure: 2D Echo, Cardiac Doppler and Color Doppler Indications:    CHF-Acute Diastolic I50.31  History:        Patient has prior history of Echocardiogram examinations, most                 recent 02/27/2023. Previous Myocardial Infarction and CAD; Risk                 Factors:Hypertension and Dyslipidemia.  Sonographer:    Celesta Gentile RCS Referring Phys: [id number] OLADAPO ADEFESO IMPRESSIONS  1. Left ventricular ejection fraction, by estimation, is 45 to 50%. The left ventricle has mildly decreased function. The left ventricle demonstrates global hypokinesis. Left ventricular diastolic parameters are indeterminate.  2. Right ventricular systolic function is low normal. The right ventricular size is mildly enlarged. There is severely elevated pulmonary artery systolic pressure.  3. Right atrial size was severely dilated.  4.  Moderate pericardial effusion. The pericardial effusion is posterior to the left ventricle and the left atrium. There is no evidence of cardiac tamponade.  5. The mitral valve is normal in structure. No evidence of mitral valve regurgitation. No evidence of mitral stenosis.  6. Tricuspid valve regurgitation is mild to moderate.  7. The aortic valve is normal in structure. Aortic valve regurgitation is mild. Aortic valve sclerosis/calcification is present, without any evidence of aortic stenosis.  8. The inferior vena cava is dilated in size with <50% respiratory variability, suggesting right atrial pressure of 15 mmHg. FINDINGS  Left Ventricle: Left ventricular ejection fraction, by estimation, is 45 to 50%. The left ventricle has mildly decreased function. The left ventricle demonstrates global hypokinesis. The left ventricular internal cavity size was normal in size. There is  no left ventricular hypertrophy. Left ventricular diastolic parameters are indeterminate. Right Ventricle: The right ventricular size is mildly enlarged. No increase in right ventricular wall thickness. Right ventricular systolic function is low normal. There is severely elevated pulmonary artery systolic pressure. The tricuspid regurgitant velocity is 4.23 m/s, and with an assumed right atrial pressure of 15 mmHg, the estimated right ventricular systolic pressure is 86.6 mmHg. Left Atrium: Left atrial size was normal in size. Right Atrium: Right atrial size was severely dilated. Pericardium: A moderately sized pericardial effusion is present. The pericardial effusion is posterior to the left ventricle and the left atrium. There is no evidence of cardiac tamponade. Presence of epicardial fat layer. Mitral Valve: The mitral valve is normal in structure. No evidence of mitral valve regurgitation. No evidence of mitral valve stenosis. Tricuspid Valve: The tricuspid valve is normal in structure. Tricuspid valve regurgitation is mild to moderate.  No evidence of tricuspid stenosis. Aortic Valve: The aortic valve is normal in structure. Aortic valve regurgitation is mild. Aortic regurgitation PHT measures 375 msec. Aortic valve sclerosis/calcification is present, without any evidence of aortic stenosis. Pulmonic Valve: The pulmonic valve was normal in structure. Pulmonic valve regurgitation is not visualized. No evidence of pulmonic stenosis. Aorta: The aortic root is  normal in size and structure. Venous: The inferior vena cava is dilated in size with less than 50% respiratory variability, suggesting right atrial pressure of 15 mmHg. IAS/Shunts: No atrial level shunt detected by color flow Doppler.  LEFT VENTRICLE PLAX 2D LVIDd:         4.30 cm   Diastology LVIDs:         3.00 cm   LV e' medial:    5.77 cm/s LV PW:         0.90 cm   LV E/e' medial:  18.7 LV IVS:        1.00 cm   LV e' lateral:   6.85 cm/s LVOT diam:     1.90 cm   LV E/e' lateral: 15.8 LV SV:         47 LV SV Index:   26 LVOT Area:     2.84 cm  RIGHT VENTRICLE RV S prime:     9.57 cm/s TAPSE (M-mode): 1.6 cm LEFT ATRIUM             Index        RIGHT ATRIUM           Index LA diam:        3.35 cm 1.89 cm/m   RA Area:     36.90 cm LA Vol (A2C):   32.4 ml 18.25 ml/m  RA Volume:   156.00 ml 87.86 ml/m LA Vol (A4C):   24.6 ml 13.85 ml/m LA Biplane Vol: 29.4 ml 16.56 ml/m  AORTIC VALVE LVOT Vmax:   94.00 cm/s LVOT Vmean:  59.000 cm/s LVOT VTI:    0.165 m AI PHT:      375 msec  AORTA Ao Root diam: 3.30 cm MITRAL VALVE                TRICUSPID VALVE MV Area (PHT): 4.60 cm     TR Peak grad:   71.6 mmHg MV Decel Time: 165 msec     TR Vmax:        423.00 cm/s MV E velocity: 108.00 cm/s                             SHUNTS                             Systemic VTI:  0.16 m                             Systemic Diam: 1.90 cm Kardie Tobb DO Electronically signed by Thomasene Ripple DO Signature Date/Time: 05/25/2023/2:20:34 PM    Final    US Venous Img Lower Bilateral (DVT) Result Date: 05/25/2023 CLINICAL  DATA:  Bilateral lower extremity edema EXAM: BILATERAL LOWER EXTREMITY VENOUS DOPPLER ULTRASOUND TECHNIQUE: Gray-scale sonography with graded compression, as well as color Doppler and duplex ultrasound were performed to evaluate the lower extremity deep venous systems from the level of the common femoral vein and including the common femoral, femoral, profunda femoral, popliteal and calf veins including the posterior tibial, peroneal and gastrocnemius veins when visible. The superficial great saphenous vein was also interrogated. Spectral Doppler was utilized to evaluate flow at rest and with distal augmentation maneuvers in the common femoral, femoral and popliteal veins. COMPARISON:  None Available. FINDINGS: RIGHT LOWER EXTREMITY Common Femoral Vein: No evidence of thrombus. Normal compressibility, respiratory  phasicity and response to augmentation. Saphenofemoral Junction: No evidence of thrombus. Normal compressibility and flow on color Doppler imaging. Profunda Femoral Vein: No evidence of thrombus. Normal compressibility and flow on color Doppler imaging. Femoral Vein: No evidence of thrombus. Normal compressibility, respiratory phasicity and response to augmentation. Popliteal Vein: No evidence of thrombus. Normal compressibility, respiratory phasicity and response to augmentation. Calf Veins: No evidence of thrombus. Normal compressibility and flow on color Doppler imaging. Superficial Great Saphenous Vein: No evidence of thrombus. Normal compressibility. Venous Reflux:  None. Other Findings:  Extensive subcutaneous edema. LEFT LOWER EXTREMITY Common Femoral Vein: No evidence of thrombus. Normal compressibility, respiratory phasicity and response to augmentation. Saphenofemoral Junction: No evidence of thrombus. Normal compressibility and flow on color Doppler imaging. Profunda Femoral Vein: No evidence of thrombus. Normal compressibility and flow on color Doppler imaging. Femoral Vein: No evidence of  thrombus. Normal compressibility, respiratory phasicity and response to augmentation. Popliteal Vein: No evidence of thrombus. Normal compressibility, respiratory phasicity and response to augmentation. Calf Veins: No evidence of thrombus. Normal compressibility and flow on color Doppler imaging. Superficial Great Saphenous Vein: No evidence of thrombus. Normal compressibility. Venous Reflux:  None. Other Findings:  Extensive subcutaneous edema. IMPRESSION: No evidence of deep venous thrombosis in either lower extremity. Extensive bilateral lower extremity edema. Electronically Signed   By: Malachy Moan M.D.   On: 05/25/2023 13:25   DG Chest Port 1 View Result Date: 05/24/2023 CLINICAL DATA:  Shortness of breath EXAM: PORTABLE CHEST 1 VIEW COMPARISON:  07/02/2022 FINDINGS: Mild left basilar atelectasis. Mild right basilar scarring, although superimposed pneumonia is possible. No pleural effusion or pneumothorax. Heart is top-normal in size. IMPRESSION: Mild right basilar scarring, superimposed pneumonia not excluded. Electronically Signed   By: Charline Bills M.D.   On: 05/24/2023 23:12     Subjective: No complaints  Discharge Exam: Vitals:   05/29/23 2352 05/30/23 0400  BP: 101/67 100/65  Pulse:  72  Resp: 16 20  Temp: 98.4 F (36.9 C) 98.7 F (37.1 C)  SpO2: 95% 97%   Vitals:   05/29/23 2000 05/29/23 2352 05/30/23 0400 05/30/23 0417  BP: (!) 96/57 101/67 100/65   Pulse: 63  72   Resp: 18 16 20    Temp: 98.3 F (36.8 C) 98.4 F (36.9 C) 98.7 F (37.1 C)   TempSrc: Oral Axillary    SpO2: 93% 95% 97%   Weight:    66 kg  Height:        General: Pt is alert, awake, not in acute distress Cardiovascular: RRR, S1/S2 +, no rubs, no gallops Respiratory: CTA bilaterally, no wheezing, no rhonchi Abdominal: Soft, NT, ND, bowel sounds + Extremities: no edema, no cyanosis    The results of significant diagnostics from this hospitalization (including imaging, microbiology,  ancillary and laboratory) are listed below for reference.     Microbiology: No results found for this or any previous visit (from the past 240 hours).   Labs: BNP (last 3 results) Recent Labs    05/24/23 2232 05/27/23 0329 05/29/23 0941  BNP 1,256.0* 1,721.0* 1,068.0*   Basic Metabolic Panel: Recent Labs  Lab 05/25/23 0404 05/26/23 0505 05/27/23 0329 05/29/23 0941 05/30/23 0416  NA 136 133* 135 133* 134*  K 3.5 4.6 3.8 3.5 4.0  CL 104 105 105 94* 99  CO2 21* 19* 23 27 26   GLUCOSE 89 92 80 101* 87  BUN 21 26* 27* 27* 29*  CREATININE 1.21 1.76* 1.54* 1.45* 1.57*  CALCIUM 8.7* 8.8* 8.8* 8.9  8.8*  MG 1.9 2.1 2.0  --   --   PHOS 2.6 3.9 3.2  --   --    Liver Function Tests: Recent Labs  Lab 05/24/23 2232 05/25/23 0404 05/26/23 0505 05/27/23 0329 05/30/23 0416  AST 15 16 18  12* 13*  ALT 10 8 8 7 6   ALKPHOS 89 74 65 57 52  BILITOT 1.8* 2.0* 1.7* 1.4* 1.9*  PROT 7.0 6.3* 5.9* 5.3* 5.5*  ALBUMIN 3.5 3.1* 2.9* 2.6* 3.0*   No results for input(s): "LIPASE", "AMYLASE" in the last 168 hours. No results for input(s): "AMMONIA" in the last 168 hours. CBC: Recent Labs  Lab 05/24/23 2232 05/25/23 0404 05/26/23 0505 05/26/23 1419 05/27/23 0329 05/29/23 0941  WBC 6.2 7.6 9.7  --  8.0 7.0  NEUTROABS 4.1  --  7.8*  --  6.1  --   HGB 17.9* 16.6 16.1 16.3 14.8 16.6  HCT 53.2* 48.8 47.1 49.6 45.8 48.6  MCV 99.4 98.8 98.3  --  99.8 98.4  PLT 158 148* 128*  --  115* 92*   Cardiac Enzymes: No results for input(s): "CKTOTAL", "CKMB", "CKMBINDEX", "TROPONINI" in the last 168 hours. BNP: Invalid input(s): "POCBNP" CBG: No results for input(s): "GLUCAP" in the last 168 hours. D-Dimer No results for input(s): "DDIMER" in the last 72 hours. Hgb A1c No results for input(s): "HGBA1C" in the last 72 hours. Lipid Profile No results for input(s): "CHOL", "HDL", "LDLCALC", "TRIG", "CHOLHDL", "LDLDIRECT" in the last 72 hours. Thyroid function studies No results for input(s):  "TSH", "T4TOTAL", "T3FREE", "THYROIDAB" in the last 72 hours.  Invalid input(s): "FREET3" Anemia work up No results for input(s): "VITAMINB12", "FOLATE", "FERRITIN", "TIBC", "IRON", "RETICCTPCT" in the last 72 hours. Urinalysis    Component Value Date/Time   COLORURINE YELLOW 04/04/2020 1925   APPEARANCEUR Clear 03/05/2023 1439   LABSPEC 1.016 04/04/2020 1925   PHURINE 6.0 04/04/2020 1925   GLUCOSEU Negative 03/05/2023 1439   HGBUR MODERATE (A) 04/04/2020 1925   BILIRUBINUR Negative 03/05/2023 1439   KETONESUR trace (5) (A) 12/03/2020 1104   KETONESUR NEGATIVE 04/04/2020 1925   PROTEINUR 1+ (A) 03/05/2023 1439   PROTEINUR 100 (A) 04/04/2020 1925   UROBILINOGEN 0.2 12/03/2020 1104   UROBILINOGEN 0.2 07/25/2012 0446   NITRITE Negative 03/05/2023 1439   NITRITE NEGATIVE 04/04/2020 1925   LEUKOCYTESUR Negative 03/05/2023 1439   LEUKOCYTESUR LARGE (A) 04/04/2020 1925   Sepsis Labs Recent Labs  Lab 05/25/23 0404 05/26/23 0505 05/27/23 0329 05/29/23 0941  WBC 7.6 9.7 8.0 7.0   Microbiology No results found for this or any previous visit (from the past 240 hours).   Time coordinating discharge: Over 30 minutes  SIGNED:   Marinda Elk, MD  [facility] 05/30/2023, 6:59 AM Pager   If 7PM-7AM, please contact night-coverage [email or web address] Password [email or web address]

## 2023-05-30 NOTE — Progress Notes (Signed)
Physical Therapy Treatment Patient Details Name: Angel [name] MRN: [id number] DOB: 28-Oct-1940 Today's Date: 05/30/2023   History of Present Illness 83 y.o. male past medical history significant for pulmonary hypertension, hyperlipidemia chronic respiratory failure with hypoxia on 2 to 3 L of oxygen at baseline comes into the ED for increased shortness of breath started on the day of admission he called EMS sats were 72% on 2 L.  He has been complaining of bilateral lower extremity swelling to the thigh denies any chest pain    PT Comments  Patient presents lying in lounge chair with feces in chair, unable to get out of chair due awkward positioning while sleeping, required Mod assist for sitting up right, then able to maintain sitting balance.  Patient able to ambulate to bathroom without AD, but had to lean on wall for support with hand held assist, required use of RW for safety and limited for gait training mostly due to c/o SOB and fatigue while on 5 LPM O2.  Patient requested to go back to bed due to fatigue after therapy.  Patient will benefit from continued skilled physical therapy in hospital and recommended venue below to increase strength, balance, endurance for safe ADLs and gait.     If plan is discharge home, recommend the following: A little help with walking and/or transfers;A little help with bathing/dressing/bathroom;Help with stairs or ramp for entrance;Assistance with cooking/housework   Can travel by private vehicle     Yes  Equipment Recommendations  None recommended by [employer] walker (2 wheels)    Recommendations for Other Services       Precautions / Restrictions Precautions Precautions: Fall Restrictions Weight Bearing Restrictions Per Provider Order: No     Mobility  Bed Mobility Overal bed mobility: Modified Independent                  Transfers Overall transfer level: Needs assistance Equipment used: Rolling walker (2 wheels), 1 person hand  held assist, None Transfers: Sit to/from Stand, Bed to chair/wheelchair/BSC Sit to Stand: Contact guard assist   Step pivot transfers: Supervision, Contact guard assist       General transfer comment: unsteady labored movement having to lean on nearby objects for support and/or hand held assist when not using RW    Ambulation/Gait Ambulation/Gait assistance: Supervision, Contact guard assist Gait Distance (Feet): 20 Feet Assistive device: Rolling walker (2 wheels) Gait Pattern/deviations: Decreased step length - left, Decreased stance time - right, Decreased stride length, Trunk flexed Gait velocity: slow     General Gait Details: slow labored cadence while on 5 LPM O2, increased time for making turns, had to use RW for safety and limited mostly due to fatigue and SOB   Stairs             Wheelchair Mobility     Tilt Bed    Modified Rankin (Stroke Patients Only)       Balance Overall balance assessment: Needs assistance Sitting-balance support: Feet supported, No upper extremity supported Sitting balance-Leahy Scale: Good Sitting balance - Comments: seated at EOB   Standing balance support: During functional activity, No upper extremity supported Standing balance-Leahy Scale: Poor Standing balance comment: fair/poor without AD, fari/good using RW                            Cognition Arousal: Alert Behavior During Therapy: WFL for tasks assessed/performed Overall Cognitive Status: Within Functional Limits for tasks assessed  Exercises      General Comments        Pertinent Vitals/Pain Pain Assessment Pain Assessment: Faces Faces Pain Scale: Hurts little more Pain Location: right side of rib cage Pain Descriptors / Indicators: Sore Pain Intervention(s): Limited activity within patient's tolerance, Monitored during session, Repositioned    Home Living                           Prior Function            PT Goals (current goals can now be found in the care plan section) Acute Rehab PT Goals Patient Stated Goal: return home PT Goal Formulation: With patient Time For Goal Achievement: 06/11/23 Potential to Achieve Goals: Good Progress towards PT goals: Progressing toward goals    Frequency    Min 3X/week      PT Plan      Co-evaluation              AM-PAC PT "6 Clicks" Mobility   Outcome Measure  Help needed turning from your back to your side while in a flat bed without using bedrails?: None Help needed moving from lying on your back to sitting on the side of a flat bed without using bedrails?: None Help needed moving to and from a bed to a chair (including a wheelchair)?: A Little Help needed standing up from a chair using your arms (e.g., wheelchair or bedside chair)?: A Little Help needed to walk in hospital room?: A Little Help needed climbing 3-5 steps with a railing? : A Lot 6 Click Score: 19    End of Session Equipment Utilized During Treatment: Oxygen Activity Tolerance: Patient tolerated treatment well;Patient limited by fatigue Patient left: in bed;with call bell/phone within reach Nurse Communication: Mobility status PT Visit Diagnosis: Unsteadiness on feet (R26.81);Muscle weakness (generalized) (M62.81)     Time: 1100-1130 PT Time Calculation (min) (ACUTE ONLY): 30 min  Charges:    $Therapeutic Activity: 23-37 mins PT General Charges $$ ACUTE PT VISIT: 1 Visit                     12:38 PM, 05/30/23 Angel [name], MPT Physical Therapist with [facility] [phone] office [phone] mobile phone

## 2023-05-30 NOTE — Plan of Care (Signed)
Patient ID: Angel [name], male   DOB: 09-16-1940, 83 y.o.   MRN: [id number]  Problem: Education: Goal: Knowledge of General Education information will improve Description: Including pain rating scale, medication(s)/side effects and non-pharmacologic comfort measures Outcome: Progressing   Problem: Health Behavior/Discharge Planning: Goal: Ability to manage health-related needs will improve Outcome: Progressing   Problem: Clinical Measurements: Goal: Will remain free from infection Outcome: Progressing Goal: Diagnostic test results will improve Outcome: Progressing Goal: Cardiovascular complication will be avoided Outcome: Progressing   Problem: Nutrition: Goal: Adequate nutrition will be maintained Outcome: Progressing   Problem: Coping: Goal: Level of anxiety will decrease Outcome: Progressing   Problem: Elimination: Goal: Will not experience complications related to bowel motility Outcome: Progressing Goal: Will not experience complications related to urinary retention Outcome: Progressing   Problem: Pain Management: Goal: General experience of comfort will improve Outcome: Progressing   Problem: Safety: Goal: Ability to remain free from injury will improve Outcome: Progressing   Problem: Skin Integrity: Goal: Risk for impaired skin integrity will decrease Outcome: Progressing   Problem: Education: Goal: Ability to demonstrate management of disease process will improve Outcome: Progressing Goal: Ability to verbalize understanding of medication therapies will improve Outcome: Progressing Goal: Individualized Educational Video(s) Outcome: Progressing   Problem: Cardiac: Goal: Ability to achieve and maintain adequate cardiopulmonary perfusion will improve Outcome: Progressing   Problem: Clinical Measurements: Goal: Ability to maintain clinical measurements within normal limits will improve Outcome: Not Progressing Blood pressure and oxygen saturation low. MD  aware. Discharge cancelled. Will continue to diuresis  Goal: Respiratory complications will improve Outcome: Not Progressing Encourage TCDB. Encourage mobility. Educated on sodium and water n hear failure   Problem: Activity: Goal: Risk for activity intolerance will decrease Outcome: Not Progressing Encouraged ambulation   Problem: Activity: Goal: Capacity to carry out activities will improve Outcome: Not Progressing Encouraged ambulation and self care.  Lidia Collum, RN

## 2023-05-30 NOTE — TOC Initial Note (Signed)
Transition of Care [facility]) - Initial/Assessment Note    Patient Details  Name: Angel [name] MRN: [id number] Date of Birth: 1940-12-11  Transition of Care [facility]) CM/SW Contact:    Elliot Gault, LCSW Phone Number: 05/30/2023, 3:49 PM  Clinical Narrative:                  Pt originally was going to dc home today but MD cancelled dc due to pt's O2 needs. Pt is on 2L baseline at home. He is currently on 5L here. Also, RN concerned pt cannot manage at home.  Spoke with pt at bedside earlier today and also asked PT to re-evaluate. PT now recommending SNF rehab. Pt not agreeable at this time. Spoke with pt's daughter who lives in [place] to update. She is going to talk to pt about strongly considering SNF. She also stated that if pt refuses, she will try to come to [place] to be with him short term.  If pt does not change his mind, plan will remain for dc home with HH.   TOC will follow.  Expected Discharge Plan: Home w Home Health Services Barriers to Discharge: Continued Medical Work up   Patient Goals and CMS Choice Patient states their goals for this hospitalization and ongoing recovery are:: to go home CMS Medicare.gov Compare Post Acute Care list provided to:: Patient Choice offered to / list presented to : Patient Hall ownership interest in [facility]provided to:: Patient    Expected Discharge Plan and Services In-house Referral: Clinical Social Work   Post Acute Care Choice: Home Health Living arrangements for the past 2 months: Mobile Home Expected Discharge Date: 05/30/23                         HH Arranged: PT HH Agency: [facility] Date HH Agency Contacted: 05/28/23 Time HH Agency Contacted: [id number] Representative spoke with at [facility] Agency: Adele Dan  Prior Living Arrangements/Services Living arrangements for the past 2 months: Mobile Home Lives with:: Self Patient language and need for interpreter reviewed:: Yes        Need for Family  Participation in Patient Care: No (Comment) Care giver support system in place?: No (comment)      Activities of Daily Living   ADL Screening (condition at time of admission) Independently performs ADLs?: Yes (appropriate for developmental age) Is the patient deaf or have difficulty hearing?: No Does the patient have difficulty seeing, even when wearing glasses/contacts?: No Does the patient have difficulty concentrating, remembering, or making decisions?: No  Permission Sought/Granted                  Emotional Assessment Appearance:: Appears stated age Attitude/Demeanor/Rapport: Engaged Affect (typically observed): Pleasant Orientation: : Oriented to Self, Oriented to Place, Oriented to  Time, Oriented to Situation Alcohol / Substance Use: Not Applicable Psych Involvement: No (comment)  Admission diagnosis:  Acute pulmonary edema (HCC) [J81.0] Pulmonary hypertension (HCC) [I27.20] Acute CHF (congestive heart failure) (HCC) [I50.9] Acute respiratory failure with hypoxia (HCC) [J96.01] Acute congestive heart failure, unspecified heart failure type (HCC) [I50.9] Patient Active Problem List   Diagnosis Date Noted   Acute on chronic diastolic CHF (congestive heart failure) (HCC) 05/25/2023   Elevated brain natriuretic peptide (BNP) level 05/25/2023   History of pulmonary hypertension 05/25/2023   Acute on chronic respiratory failure with hypoxia (HCC) 05/25/2023   Multiple pulmonary nodules determined by computed tomography of lung 02/11/2023   Pulmonary hypertension (  HCC) 11/17/2021   Edema of both lower legs 09/25/2021   Centrilobular emphysema (HCC) 07/17/2021   DOE (dyspnea on exertion) 07/05/2021   Chronic kidney disease, stage 3a (HCC) 12/18/2020   ILD (interstitial lung disease) (HCC) 11/16/2020   Chronic respiratory failure with hypoxia (HCC) 10/19/2020   Acute cystitis without hematuria 07/07/2019   Calculus of bladder 07/07/2019   Right kidney mass 04/05/2018    Renal cell cancer, right (HCC) 05/24/2017   BPH (benign prostatic hyperplasia) 02/24/2017   Diverticulosis of colon 10/10/2016   Tubular adenoma of colon 10/10/2016   Hernia of abdominal wall 07/04/2011   Elevated PSA 06/20/2011   GERD (gastroesophageal reflux disease) 07/10/2010   Osteoarthritis, multiple sites 04/17/2010   ABDOMINAL PAIN-GENERALIZED 02/16/2010   ESSENTIAL HYPERTENSION, BENIGN 02/02/2010   ELEVATED BLOOD PRESSURE 01/31/2010   MIXED HYPERLIPIDEMIA 01/18/2010   CORONARY ATHEROSCLEROSIS NATIVE CORONARY ARTERY 01/18/2010   FATIGUE 01/18/2010   PCP:  Tracey Harries, MD Pharmacy:   [facility] - [place], [place] - [address] [place] [address] Phone: [phone] Fax: [phone]  [facility] [phone] - [place], [place] - [address] AT [facility] OF [address] [place] [address] Phone: [phone] Fax: [phone]     Social Drivers of Health (SDOH) Social History: SDOH Screenings   Food Insecurity: No Food Insecurity (05/25/2023)  Housing: Low Risk  (05/25/2023)  Transportation Needs: No Transportation Needs (05/25/2023)  Utilities: Not At Risk (05/25/2023)  Financial Resource Strain: Low Risk  (10/28/2022)   Received from [facility], [facility]  Physical Activity: Unknown (10/28/2022)   Received from [facility]  Recent Concern: Physical Activity - Inactive (10/28/2022)   Received from [facility], [facility]  Social Connections: Socially Isolated (05/25/2023)  Stress: No Stress Concern Present (10/28/2022)   Received from [facility], [facility]  Tobacco Use: Medium Risk (05/27/2023)   SDOH Interventions:     Readmission Risk Interventions    05/28/2023   11:19 AM  Readmission Risk Prevention Plan  Post Dischage Appt Not Complete  Medication Screening Complete  Transportation Screening Complete

## 2023-05-30 NOTE — Progress Notes (Signed)
[facility] PROGRESS NOTE    Progress Note  Angel [name]  VWU:[id number] DOB: 13-May-1941 DOA: 05/24/2023 PCP: Tracey Harries, MD     Brief Narrative:   Angel [name] is an 83 y.o. male past medical history significant for pulmonary hypertension, hyperlipidemia chronic respiratory failure with hypoxia on 2 to 3 L of oxygen at baseline comes into the ED for increased shortness of breath started on the day of admission he called EMS sats were 72% on 2 L.  He has been complaining of bilateral lower extremity swelling to the thigh denies any chest pain. CT angio of the chest showed spiculated left upper lobe nodule with no significant interval change in 4 months.  No PE and new bilateral pleural effusions.   Assessment/Plan:   Acute respiratory failure on chronic with hypoxia secondary to acute on chronic diastolic CHF (congestive heart failure) (HCC) and with bilateral pleural effusion: 2D echo was done that showed an EF of 45% global hypokinesia elevated pulmonary pressures and a moderate pericardial effusion IVC dilated with less than 50% respiratory variability. BNP on admission was 1700 After I spoke to the daughter he agreed to IV Lasix. Continue strict I's and O's and daily weights Probably decision to oral Lasix tomorrow. He continues to diurese well about 5.9L.  Acute kidney injury: Question cardiorenal syndrome renal dysfunction is improving with IV diuresis. Continue IV Lasix.  Acute urinary retention due to BPH/hematuria: Currently on Proscar and Flomax. He was giving a voiding trial which he failed Will need to go home with Foley.  Thrombocytopenia: Discontinue heparin. Continue to monitor  History of pulmonary hypertension/: 2D echo done October 2024 showed elevated pulmonary pressures. He declined a right heart cath. Follow-up with Dr. Sherene Sires pulmonary as an outpatient.  Acute on chronic respiratory failure with hypoxia: CT in 2024 showed fibrotic  interstitial lung disease since 2022, question UIP. Repeated CT angio of the chest showed spiculated pulmonary nodule with bilateral pleural effusion no PE. Continue supplemental oxygen follow-up with pulmonary as an outpatient.  CAD: Currently on aspirin, refused statin therapy as an outpatient.  History of known solid right kidney mass: Renal ultrasound showed an 8.8 cm renal mass in the inferior pole consistent with renal cell carcinoma, patient will follow-up with Dr. Ronne Binning as an outpatient. Not a surgical candidate.    History of known spiculated 14 x 12 mm posterior left upper lobe nodule: Was first seen on CT in September 2024, appreciated again on this admission during CT angio of the chest to rule out PE.  Smaller solid pulmonary nodules scattered throughout both lungs. Follow-up with pulmonary for spiculated lung mass.  Surgery decubitus ulcer present on admission: RN Pressure Injury Documentation: Pressure Injury 05/27/23 Buttocks (Active)  05/27/23   Location: Buttocks  Location Orientation:   Staging:   Wound Description (Comments):   Present on Admission:   Dressing Type Foam - Lift dressing to assess site every shift 05/28/23 0813     DVT prophylaxis: SCD Family Communication:none Status is: Inpatient Remains inpatient appropriate because: Acute respiratory failure with hypoxia due to acute diastolic dysfunction    Code Status:     Code Status Orders  (From admission, onward)           Start     Ordered   05/25/23 0304  Full code  Continuous       Question:  By:  Answer:  Consent: discussion documented in EHR   05/25/23 0305  Code Status History     Date Active Date Inactive Code Status Order ID Comments User Context   02/24/2017 1638 02/26/2017 1332 Full Code [id number]  Malen Gauze, MD Inpatient         IV Access:   Peripheral IV   Procedures and diagnostic studies:   No results found.    Medical  Consultants:   None.   Subjective:    Angel [name] more calm today.  Willing to take Lasix  Objective:    Vitals:   05/29/23 2000 05/29/23 2352 05/30/23 0400 05/30/23 0417  BP: (!) 96/57 101/67 100/65   Pulse: 63  72   Resp: 18 16 20    Temp: 98.3 F (36.8 C) 98.4 F (36.9 C) 98.7 F (37.1 C)   TempSrc: Oral Axillary    SpO2: 93% 95% 97%   Weight:    66 kg  Height:       SpO2: 97 % O2 Flow Rate (L/min): 5 L/min FiO2 (%): 40 %   Intake/Output Summary (Last 24 hours) at 05/30/2023 0659 Last data filed at 05/29/2023 0859 Gross per 24 hour  Intake 240 ml  Output --  Net 240 ml   Filed Weights   05/28/23 1114 05/29/23 0332 05/30/23 0417  Weight: 62.2 kg 66 kg 66 kg    Exam: General exam: In no acute distress. Respiratory system: Good air movement and clear to auscultation. Cardiovascular system: S1 & S2 heard, RRR. No JVD. Gastrointestinal system: Abdomen is nondistended, soft and nontender.  Extremities: No pedal edema. Skin: No rashes, lesions or ulcers Psychiatry: Judgement and insight appear normal. Mood & affect appropriate.  Data Reviewed:    Labs: Basic Metabolic Panel: Recent Labs  [name] 05/25/23 0404 05/26/23 0505 05/27/23 0329 05/29/23 0941 05/30/23 0416  NA 136 133* 135 133* 134*  K 3.5 4.6 3.8 3.5 4.0  CL 104 105 105 94* 99  CO2 21* 19* 23 27 26   GLUCOSE 89 92 80 101* 87  BUN 21 26* 27* 27* 29*  CREATININE 1.21 1.76* 1.54* 1.45* 1.57*  CALCIUM 8.7* 8.8* 8.8* 8.9 8.8*  MG 1.9 2.1 2.0  --   --   PHOS 2.6 3.9 3.2  --   --    GFR Estimated Creatinine Clearance: 33.3 mL/min (A) (by C-G formula based on SCr of 1.57 mg/dL (H)). Liver Function Tests: Recent Labs  [name] 05/24/23 2232 05/25/23 0404 05/26/23 0505 05/27/23 0329 05/30/23 0416  AST 15 16 18  12* 13*  ALT 10 8 8 7 6   ALKPHOS 89 74 65 57 52  BILITOT 1.8* 2.0* 1.7* 1.4* 1.9*  PROT 7.0 6.3* 5.9* 5.3* 5.5*  ALBUMIN 3.5 3.1* 2.9* 2.6* 3.0*   No results for input(s): "LIPASE",  "AMYLASE" in the last 168 hours. No results for input(s): "AMMONIA" in the last 168 hours. Coagulation profile No results for input(s): "INR", "PROTIME" in the last 168 hours. COVID-19 Labs  No results for input(s): "DDIMER", "FERRITIN", "LDH", "CRP" in the last 72 hours.  [name] Results  Component Value Date   SARSCOV2NAA NEGATIVE 10/16/2020   SARSCOV2NAA NEGATIVE 09/25/2020    CBC: Recent Labs  [name] 05/24/23 2232 05/25/23 0404 05/26/23 0505 05/26/23 1419 05/27/23 0329 05/29/23 0941  WBC 6.2 7.6 9.7  --  8.0 7.0  NEUTROABS 4.1  --  7.8*  --  6.1  --   HGB 17.9* 16.6 16.1 16.3 14.8 16.6  HCT 53.2* 48.8 47.1 49.6 45.8 48.6  MCV 99.4 98.8 98.3  --  99.8 98.4  PLT 158 148* 128*  --  115* 92*   Cardiac Enzymes: No results for input(s): "CKTOTAL", "CKMB", "CKMBINDEX", "TROPONINI" in the last 168 hours. BNP (last 3 results) No results for input(s): "PROBNP" in the last 8760 hours. CBG: No results for input(s): "GLUCAP" in the last 168 hours. D-Dimer: No results for input(s): "DDIMER" in the last 72 hours. Hgb A1c: No results for input(s): "HGBA1C" in the last 72 hours. Lipid Profile: No results for input(s): "CHOL", "HDL", "LDLCALC", "TRIG", "CHOLHDL", "LDLDIRECT" in the last 72 hours. Thyroid function studies: No results for input(s): "TSH", "T4TOTAL", "T3FREE", "THYROIDAB" in the last 72 hours.  Invalid input(s): "FREET3" Anemia work up: No results for input(s): "VITAMINB12", "FOLATE", "FERRITIN", "TIBC", "IRON", "RETICCTPCT" in the last 72 hours. Sepsis Labs: Recent Labs  [name] 05/25/23 0404 05/26/23 0505 05/27/23 0329 05/29/23 0941  WBC 7.6 9.7 8.0 7.0   Microbiology No results found for this or any previous visit (from the past 240 hours).   Medications:    Chlorhexidine Gluconate Cloth  6 each Topical Daily   enoxaparin (LOVENOX) injection  40 mg Subcutaneous Q24H   finasteride  5 mg Oral Daily   furosemide  40 mg Oral BID   metoprolol tartrate  12.5 mg  Oral BID   tamsulosin  0.4 mg Oral QPC supper   Continuous Infusions:    LOS: 6 days   Marinda Elk  [facility]  05/30/2023, 6:59 AM

## 2023-05-30 NOTE — Plan of Care (Signed)
Pt is alert and oriented x 4. General weakness. Pt has had frequent loose bm's. Pt received tylenol for pain in perineum. Pt has erythema to sacrum related to frequent bm. Protective ointment applied.  Problem: Elimination: Goal: Will not experience complications related to bowel motility Outcome: Not Progressing   Problem: Education: Goal: Knowledge of General Education information will improve Description: Including pain rating scale, medication(s)/side effects and non-pharmacologic comfort measures Outcome: Progressing   Problem: Health Behavior/Discharge Planning: Goal: Ability to manage health-related needs will improve Outcome: Progressing   Problem: Clinical Measurements: Goal: Ability to maintain clinical measurements within normal limits will improve Outcome: Progressing Goal: Will remain free from infection Outcome: Progressing Goal: Diagnostic test results will improve Outcome: Progressing Goal: Respiratory complications will improve Outcome: Progressing Goal: Cardiovascular complication will be avoided Outcome: Progressing   Problem: Activity: Goal: Risk for activity intolerance will decrease Outcome: Progressing   Problem: Nutrition: Goal: Adequate nutrition will be maintained Outcome: Progressing   Problem: Coping: Goal: Level of anxiety will decrease Outcome: Progressing   Problem: Elimination: Goal: Will not experience complications related to urinary retention Outcome: Progressing   Problem: Pain Management: Goal: General experience of comfort will improve Outcome: Progressing   Problem: Safety: Goal: Ability to remain free from injury will improve Outcome: Progressing   Problem: Education: Goal: Ability to demonstrate management of disease process will improve Outcome: Progressing Goal: Ability to verbalize understanding of medication therapies will improve Outcome: Progressing Goal: Individualized Educational Video(s) Outcome: Progressing    Problem: Activity: Goal: Capacity to carry out activities will improve Outcome: Progressing   Problem: Cardiac: Goal: Ability to achieve and maintain adequate cardiopulmonary perfusion will improve Outcome: Progressing

## 2023-05-31 DIAGNOSIS — I272 Pulmonary hypertension, unspecified: Secondary | ICD-10-CM | POA: Diagnosis not present

## 2023-05-31 DIAGNOSIS — I5033 Acute on chronic diastolic (congestive) heart failure: Secondary | ICD-10-CM | POA: Diagnosis not present

## 2023-05-31 DIAGNOSIS — J9601 Acute respiratory failure with hypoxia: Secondary | ICD-10-CM | POA: Diagnosis not present

## 2023-05-31 DIAGNOSIS — J810 Acute pulmonary edema: Secondary | ICD-10-CM | POA: Diagnosis not present

## 2023-05-31 LAB — BASIC METABOLIC PANEL
Anion gap: 11 (ref 5–15)
BUN: 33 mg/dL — ABNORMAL HIGH (ref 8–23)
CO2: 25 mmol/L (ref 22–32)
Calcium: 8.8 mg/dL — ABNORMAL LOW (ref 8.9–10.3)
Chloride: 99 mmol/L (ref 98–111)
Creatinine, Ser: 1.71 mg/dL — ABNORMAL HIGH (ref 0.61–1.24)
GFR, Estimated: 39 mL/min — ABNORMAL LOW (ref >60)
Glucose, Bld: 80 mg/dL (ref 70–99)
Potassium: 3.8 mmol/L (ref 3.5–5.1)
Sodium: 135 mmol/L (ref 135–145)

## 2023-05-31 MED ORDER — FUROSEMIDE 40 MG PO TABS
40.0000 mg | ORAL_TABLET | Freq: Every day | ORAL | Status: DC
  Administered 2023-05-31: 40 mg via ORAL
  Filled 2023-05-31: qty 1

## 2023-05-31 NOTE — Discharge Summary (Signed)
Physician Discharge Summary  Angel [name] WUJ:[id number] DOB: 1940/08/10 DOA: 05/24/2023  PCP: Tracey Harries, MD  Admit date: 05/24/2023 Discharge date: 05/31/2023  Admitted From: Home Disposition:  Home  Recommendations for Outpatient Follow-up:  Follow up with Pulmonary in 1-2 weeks Please obtain BMP/CBC in one week Palliative care to follow-up at home discussed goals of care and end-of-life with patient and family.   Home Health:No Equipment/Devices:None  Discharge Condition:Guarded CODE STATUS:Full Diet recommendation: Heart Healthy  Brief/Interim Summary: 83 y.o. male past medical history significant for pulmonary hypertension, hyperlipidemia chronic respiratory failure with hypoxia on 2 to 3 L of oxygen at baseline comes into the ED for increased shortness of breath started on the day of admission he called EMS sats were 72% on 2 L.  He has been complaining of bilateral lower extremity swelling to the thigh denies any chest pain   Discharge Diagnoses:  Principal Problem:   Acute on chronic diastolic CHF (congestive heart failure) (HCC) Active Problems:   CORONARY ATHEROSCLEROSIS NATIVE CORONARY ARTERY   Right kidney mass   Elevated brain natriuretic peptide (BNP) level   History of pulmonary hypertension   Acute on chronic respiratory failure with hypoxia (HCC)  Acute respiratory failure on chronic with hypoxia secondary to acute on chronic diastolic CHF (congestive heart failure) (HCC) and with bilateral pleural effusion: CT angio of the chest showed spiculated left upper lobe nodule with no significant interval change in 4 months.  No PE and new bilateral pleural effusions. 2D echo was done that showed an EF of 45% global hypokinesia elevated pulmonary pressures and a moderate pericardial effusion IVC dilated with less than 50% respiratory variability. BNP on admission was 1700 Will start on IV Lasix and he diuresed about 8 L. Continue strict I's and O's and daily  weights He has been told to avoid NSAIDs as this can worsen heart failure. He is willing to take Lasix at home we will put it at 40 mg daily follow-up with cardiology and pulmonary as an outpatient.  Acute kidney injury: Question cardiorenal syndrome renal dysfunction is improving with IV diuresis. Diuresed well his creatinine improved will need to continue Lasix as an outpatient.   Urinary retention/hematuria: Suspect traumatic in nature. He was given a voiding trial which he failed. Foley had to be placed back in he will follow-up with urology as an outpatient he will go home on Flomax and finasteride.   Thrombocytopenia: Continue to monitor as an outpatient.   History of pulmonary hypertension: 2D echo done October 2024 showed elevated pulmonary pressures. He declined a right heart cath. Follow-up with Dr. Sherene Sires pulmonary as an outpatient.    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 Repeated CT angio of the chest showed spiculated pulmonary nodule with bilateral pleural effusion no PE. Continue supplemental oxygen follow-up with pulmonary as an outpatient.   CAD: Currently on aspirin, refused statin therapy as an outpatient.   History of known solid right kidney mass: Renal ultrasound showed an 8.8 cm renal mass in the inferior pole consistent with renal cell carcinoma, patient will follow-up with Dr. Ronne Binning as an outpatient. Not a surgical candidate.    History of known spiculated 14 x 12 mm posterior left upper lobe nodule: Was first seen on CT in September 2024, appreciated again on this admission during CT angio of the chest to rule out PE.  Smaller solid pulmonary nodules scattered throughout both lungs. Follow-up with pulmonary  for spiculated lung mass.  Discharge Instructions  Discharge Instructions     Diet - low sodium heart healthy   Complete by: As directed    Diet - low  sodium heart healthy   Complete by: As directed    Increase activity slowly   Complete by: As directed    Increase activity slowly   Complete by: As directed    No wound care   Complete by: As directed    No wound care   Complete by: As directed       Allergies as of 05/31/2023       Reactions   Ciprofloxacin Other (See Comments)   Crestor [rosuvastatin] Other (See Comments)   Unknown    Penicillins Other (See Comments)   Per patient causes GI upset with high doses. He says he does OK with shots but gets GI upset with PO penicillins Can take small doses with no issues   Tramadol Other (See Comments)   Confusion   Lisinopril Rash, Cough   Patient says it caused bp to increase and caused a rash on his legs    Macrodantin [nitrofurantoin] Rash        Medication List     TAKE these medications    aspirin EC 81 MG tablet Take 81 mg by mouth daily. What changed: Another medication with the same name was removed. Continue taking this medication, and follow the directions you see here.   furosemide 40 MG tablet Commonly known as: LASIX Take 1 tablet (40 mg total) by mouth daily.   ibuprofen 200 MG tablet Commonly known as: ADVIL Take 200 mg by mouth daily as needed for moderate pain (pain score 4-6).   metoprolol tartrate 25 MG tablet Commonly known as: LOPRESSOR Take 0.5 tablets (12.5 mg total) by mouth 2 (two) times daily.   nitroGLYCERIN 0.4 MG SL tablet Commonly known as: NITROSTAT Place 1 tablet (0.4 mg total) under the tongue every 5 (five) minutes x 3 doses as needed for chest pain (if after 3rd dose no relief proceed to ED or call 911).   tamsulosin 0.4 MG Caps capsule Commonly known as: FLOMAX Take 1 capsule (0.4 mg total) by mouth daily after supper.          Follow-up Information     [facility] Follow up.   Why: PT will call to schedule your first home visit. Contact information: [address] [place]  [address] [phone]         [facility] Follow up.   Why: Palliative nurse will call to schedule first appointment               Allergies  Allergen Reactions   Ciprofloxacin Other (See Comments)   Crestor [Rosuvastatin] Other (See Comments)    Unknown    Penicillins Other (See Comments)    Per patient causes GI upset with high doses. He says he does OK with shots but gets GI upset with PO penicillins Can take small doses with no issues   Tramadol Other (See Comments)    Confusion   Lisinopril Rash and Cough    Patient says it caused bp to increase and caused a rash on his legs    Macrodantin [Nitrofurantoin] Rash    Consultations: None   Procedures/Studies: DG CHEST PORT 1 VIEW Result Date: 05/27/2023 CLINICAL DATA:  Shortness of breath.  Hypoxia EXAM: PORTABLE CHEST 1 VIEW COMPARISON:  X-ray 05/24/2023.  CTA 05/25/2023. FINDINGS: Hyperinflation with  chronic changes. More focal opacity in the right lung base with a tiny right effusion, decreasing from previous. Enlarged cardiopericardial silhouette. No pneumothorax or edema. Degenerative changes of the spine. Overlapping cardiac leads. Spiculated left upper lobe nodule is not as well seen on this x-ray. IMPRESSION: Decreased right lung base opacity with small effusion. Recommend continued follow up Spiculated nodule in the left upper lung on prior CT is not well seen on this x-ray. Please correlate with the prior CT recommendations Electronically Signed   By: Karen Kays M.D.   On: 05/27/2023 17:18   US RENAL Result Date: 05/27/2023 CLINICAL DATA:  Right renal mass. EXAM: RENAL / URINARY TRACT ULTRASOUND COMPLETE COMPARISON:  Renal ultrasound 02/26/2023. FINDINGS: Right Kidney: Renal measurements: 9.4 x 5.8 x 5.2 cm = volume: 148 mL. No collecting system dilatation. There is heterogeneous large mass along the lower pole of the right kidney measuring 8.1 cm. Please correlate for any known history. Previously seen at 8.8 cm. Left  Kidney: Renal measurements: 9.6 x 4.8 x 3.7 cm = volume: 89 mL. Echogenicity within normal limits. No mass or hydronephrosis visualized. Bladder: Contracted with a Foley catheter. Other: Scattered ascites.  Right-sided pleural effusion. IMPRESSION: No collecting system dilatation of either kidney. Large heterogeneous lobular lower pole right-sided renal mass. Please correlate for known history and any history of a malignant process. Ascites.  Right pleural effusion Electronically Signed   By: Karen Kays M.D.   On: 05/27/2023 17:16   CT Angio Chest Pulmonary Embolism (PE) W or WO Contrast Result Date: 05/25/2023 CLINICAL DATA:  Short of breath.  * Tracking Code: BO * EXAM: CT ANGIOGRAPHY CHEST WITH CONTRAST TECHNIQUE: Multidetector CT imaging of the chest was performed using the standard protocol during bolus administration of intravenous contrast. Multiplanar CT image reconstructions and MIPs were obtained to evaluate the vascular anatomy. RADIATION DOSE REDUCTION: This exam was performed according to the departmental dose-optimization program which includes automated exposure control, adjustment of the mA and/or kV according to patient size and/or use of iterative reconstruction technique. CONTRAST:  75mL OMNIPAQUE IOHEXOL 350 MG/ML SOLN COMPARISON:  None Available. FINDINGS: Cardiovascular: No filling defects within the pulmonary arteries to suggest acute pulmonary embolism. Mediastinum/Nodes: No axillary or supraclavicular adenopathy. No mediastinal or hilar adenopathy. No pericardial fluid. Esophagus normal. Lungs/Pleura: Persistent spiculated LEFT upper lobe pulmonary nodule measures 14 mm 12 mm (image 39/series 6) compared to 16 mm x 12 mm on prior. Visually lesion appears very similar. Smaller LEFT upper lobe nodule measuring 6 mm (image 45) is unchanged. Several scattered subcentimeter RIGHT lung nodules are unchanged. New layering small to moderate sized RIGHT pleural effusion. Small LEFT pleural  effusion. Upper Abdomen: Limited view of the liver, kidneys, pancreas are unremarkable. Normal adrenal glands. Musculoskeletal: No aggressive osseous lesion. Review of the MIP images confirms the above findings. IMPRESSION: 1. Persistent spiculated LEFT upper lobe pulmonary nodule. No significant interval changed in 4 month interval. Lesion remains concerning for bronchogenic carcinoma. Consider FDG PET scan versus tissue sampling. 2. No evidence acute pulmonary embolism. 3. New bilateral pleural effusions. These results will be called to the ordering clinician or representative by the Radiologist Assistant, and communication documented in the PACS or [employer]. Electronically Signed   By: Genevive Bi M.D.   On: 05/25/2023 14:26   ECHOCARDIOGRAM COMPLETE Result Date: 05/25/2023    ECHOCARDIOGRAM REPORT   Patient Name:   Ramonita Lab Date of Exam: 05/25/2023 Medical Rec #:  [id number]     Height:  70.0 in Accession #:    [id number]    Weight:       136.7 lb Date of Birth:  1940-11-20    BSA:          1.776 m Patient Age:    82 years      BP:           115/69 mmHg Patient Gender: M             HR:           81 bpm. [facility] Procedure: 2D Echo, Cardiac Doppler and Color Doppler Indications:    CHF-Acute Diastolic I50.31  History:        Patient has prior history of Echocardiogram examinations, most                 recent 02/27/2023. Previous Myocardial Infarction and CAD; Risk                 Factors:Hypertension and Dyslipidemia.  Sonographer:    Celesta Gentile RCS Referring Phys: [id number] OLADAPO ADEFESO IMPRESSIONS  1. Left ventricular ejection fraction, by estimation, is 45 to 50%. The left ventricle has mildly decreased function. The left ventricle demonstrates global hypokinesis. Left ventricular diastolic parameters are indeterminate.  2. Right ventricular systolic function is low normal. The right ventricular size is mildly enlarged. There is severely elevated pulmonary  artery systolic pressure.  3. Right atrial size was severely dilated.  4. Moderate pericardial effusion. The pericardial effusion is posterior to the left ventricle and the left atrium. There is no evidence of cardiac tamponade.  5. The mitral valve is normal in structure. No evidence of mitral valve regurgitation. No evidence of mitral stenosis.  6. Tricuspid valve regurgitation is mild to moderate.  7. The aortic valve is normal in structure. Aortic valve regurgitation is mild. Aortic valve sclerosis/calcification is present, without any evidence of aortic stenosis.  8. The inferior vena cava is dilated in size with <50% respiratory variability, suggesting right atrial pressure of 15 mmHg. FINDINGS  Left Ventricle: Left ventricular ejection fraction, by estimation, is 45 to 50%. The left ventricle has mildly decreased function. The left ventricle demonstrates global hypokinesis. The left ventricular internal cavity size was normal in size. There is  no left ventricular hypertrophy. Left ventricular diastolic parameters are indeterminate. Right Ventricle: The right ventricular size is mildly enlarged. No increase in right ventricular wall thickness. Right ventricular systolic function is low normal. There is severely elevated pulmonary artery systolic pressure. The tricuspid regurgitant velocity is 4.23 m/s, and with an assumed right atrial pressure of 15 mmHg, the estimated right ventricular systolic pressure is 86.6 mmHg. Left Atrium: Left atrial size was normal in size. Right Atrium: Right atrial size was severely dilated. Pericardium: A moderately sized pericardial effusion is present. The pericardial effusion is posterior to the left ventricle and the left atrium. There is no evidence of cardiac tamponade. Presence of epicardial fat layer. Mitral Valve: The mitral valve is normal in structure. No evidence of mitral valve regurgitation. No evidence of mitral valve stenosis. Tricuspid Valve: The tricuspid valve  is normal in structure. Tricuspid valve regurgitation is mild to moderate. No evidence of tricuspid stenosis. Aortic Valve: The aortic valve is normal in structure. Aortic valve regurgitation is mild. Aortic regurgitation PHT measures 375 msec. Aortic valve sclerosis/calcification is present, without any evidence of aortic stenosis. Pulmonic Valve: The pulmonic valve was normal in structure. Pulmonic valve regurgitation is not visualized. No evidence of  pulmonic stenosis. Aorta: The aortic root is normal in size and structure. Venous: The inferior vena cava is dilated in size with less than 50% respiratory variability, suggesting right atrial pressure of 15 mmHg. IAS/Shunts: No atrial level shunt detected by color flow Doppler.  LEFT VENTRICLE PLAX 2D LVIDd:         4.30 cm   Diastology LVIDs:         3.00 cm   LV e' medial:    5.77 cm/s LV PW:         0.90 cm   LV E/e' medial:  18.7 LV IVS:        1.00 cm   LV e' lateral:   6.85 cm/s LVOT diam:     1.90 cm   LV E/e' lateral: 15.8 LV SV:         47 LV SV Index:   26 LVOT Area:     2.84 cm  RIGHT VENTRICLE RV S prime:     9.57 cm/s TAPSE (M-mode): 1.6 cm LEFT ATRIUM             Index        RIGHT ATRIUM           Index LA diam:        3.35 cm 1.89 cm/m   RA Area:     36.90 cm LA Vol (A2C):   32.4 ml 18.25 ml/m  RA Volume:   156.00 ml 87.86 ml/m LA Vol (A4C):   24.6 ml 13.85 ml/m LA Biplane Vol: 29.4 ml 16.56 ml/m  AORTIC VALVE LVOT Vmax:   94.00 cm/s LVOT Vmean:  59.000 cm/s LVOT VTI:    0.165 m AI PHT:      375 msec  AORTA Ao Root diam: 3.30 cm MITRAL VALVE                TRICUSPID VALVE MV Area (PHT): 4.60 cm     TR Peak grad:   71.6 mmHg MV Decel Time: 165 msec     TR Vmax:        423.00 cm/s MV E velocity: 108.00 cm/s                             SHUNTS                             Systemic VTI:  0.16 m                             Systemic Diam: 1.90 cm Kardie Tobb DO Electronically signed by Thomasene Ripple DO Signature Date/Time: 05/25/2023/2:20:34 PM    Final     US Venous Img Lower Bilateral (DVT) Result Date: 05/25/2023 CLINICAL DATA:  Bilateral lower extremity edema EXAM: BILATERAL LOWER EXTREMITY VENOUS DOPPLER ULTRASOUND TECHNIQUE: Gray-scale sonography with graded compression, as well as color Doppler and duplex ultrasound were performed to evaluate the lower extremity deep venous systems from the level of the common femoral vein and including the common femoral, femoral, profunda femoral, popliteal and calf veins including the posterior tibial, peroneal and gastrocnemius veins when visible. The superficial great saphenous vein was also interrogated. Spectral Doppler was utilized to evaluate flow at rest and with distal augmentation maneuvers in the common femoral, femoral and popliteal veins. COMPARISON:  None Available. FINDINGS: RIGHT LOWER EXTREMITY Common Femoral Vein:  No evidence of thrombus. Normal compressibility, respiratory phasicity and response to augmentation. Saphenofemoral Junction: No evidence of thrombus. Normal compressibility and flow on color Doppler imaging. Profunda Femoral Vein: No evidence of thrombus. Normal compressibility and flow on color Doppler imaging. Femoral Vein: No evidence of thrombus. Normal compressibility, respiratory phasicity and response to augmentation. Popliteal Vein: No evidence of thrombus. Normal compressibility, respiratory phasicity and response to augmentation. Calf Veins: No evidence of thrombus. Normal compressibility and flow on color Doppler imaging. Superficial Great Saphenous Vein: No evidence of thrombus. Normal compressibility. Venous Reflux:  None. Other Findings:  Extensive subcutaneous edema. LEFT LOWER EXTREMITY Common Femoral Vein: No evidence of thrombus. Normal compressibility, respiratory phasicity and response to augmentation. Saphenofemoral Junction: No evidence of thrombus. Normal compressibility and flow on color Doppler imaging. Profunda Femoral Vein: No evidence of thrombus. Normal  compressibility and flow on color Doppler imaging. Femoral Vein: No evidence of thrombus. Normal compressibility, respiratory phasicity and response to augmentation. Popliteal Vein: No evidence of thrombus. Normal compressibility, respiratory phasicity and response to augmentation. Calf Veins: No evidence of thrombus. Normal compressibility and flow on color Doppler imaging. Superficial Great Saphenous Vein: No evidence of thrombus. Normal compressibility. Venous Reflux:  None. Other Findings:  Extensive subcutaneous edema. IMPRESSION: No evidence of deep venous thrombosis in either lower extremity. Extensive bilateral lower extremity edema. Electronically Signed   By: Malachy Moan M.D.   On: 05/25/2023 13:25   DG Chest Port 1 View Result Date: 05/24/2023 CLINICAL DATA:  Shortness of breath EXAM: PORTABLE CHEST 1 VIEW COMPARISON:  07/02/2022 FINDINGS: Mild left basilar atelectasis. Mild right basilar scarring, although superimposed pneumonia is possible. No pleural effusion or pneumothorax. Heart is top-normal in size. IMPRESSION: Mild right basilar scarring, superimposed pneumonia not excluded. Electronically Signed   By: Charline Bills M.D.   On: 05/24/2023 23:12     Subjective: No complaints  Discharge Exam: Vitals:   05/30/23 2151 05/31/23 0355  BP: 104/64 100/63  Pulse: (!) 57 62  Resp:  14  Temp:  97.7 F (36.5 C)  SpO2:  94%   Vitals:   05/30/23 1430 05/30/23 2018 05/30/23 2151 05/31/23 0355  BP:  104/64 104/64 100/63  Pulse:  (!) 57 (!) 57 62  Resp:  14  14  Temp:  (!) 97.5 F (36.4 C)  97.7 F (36.5 C)  TempSrc:  Oral  Oral  SpO2: 90% 95%  94%  Weight:    67.7 kg  Height:        General: Pt is alert, awake, not in acute distress Cardiovascular: RRR, S1/S2 +, no rubs, no gallops Respiratory: CTA bilaterally, no wheezing, no rhonchi Abdominal: Soft, NT, ND, bowel sounds + Extremities: no edema, no cyanosis    The results of significant diagnostics from this  hospitalization (including imaging, microbiology, ancillary and laboratory) are listed below for reference.     Microbiology: No results found for this or any previous visit (from the past 240 hours).   Labs: BNP (last 3 results) Recent Labs    05/24/23 2232 05/27/23 0329 05/29/23 0941  BNP 1,256.0* 1,721.0* 1,068.0*   Basic Metabolic Panel: Recent Labs  Lab 05/25/23 0404 05/26/23 0505 05/27/23 0329 05/29/23 0941 05/30/23 0416 05/31/23 0426  NA 136 133* 135 133* 134* 135  K 3.5 4.6 3.8 3.5 4.0 3.8  CL 104 105 105 94* 99 99  CO2 21* 19* 23 27 26 25   GLUCOSE 89 92 80 101* 87 80  BUN 21 26* 27* 27* 29* 33*  CREATININE 1.21 1.76* 1.54* 1.45* 1.57* 1.71*  CALCIUM 8.7* 8.8* 8.8* 8.9 8.8* 8.8*  MG 1.9 2.1 2.0  --   --   --   PHOS 2.6 3.9 3.2  --   --   --    Liver Function Tests: Recent Labs  Lab 05/24/23 2232 05/25/23 0404 05/26/23 0505 05/27/23 0329 05/30/23 0416  AST 15 16 18  12* 13*  ALT 10 8 8 7 6   ALKPHOS 89 74 65 57 52  BILITOT 1.8* 2.0* 1.7* 1.4* 1.9*  PROT 7.0 6.3* 5.9* 5.3* 5.5*  ALBUMIN 3.5 3.1* 2.9* 2.6* 3.0*   No results for input(s): "LIPASE", "AMYLASE" in the last 168 hours. No results for input(s): "AMMONIA" in the last 168 hours. CBC: Recent Labs  Lab 05/24/23 2232 05/25/23 0404 05/26/23 0505 05/26/23 1419 05/27/23 0329 05/29/23 0941  WBC 6.2 7.6 9.7  --  8.0 7.0  NEUTROABS 4.1  --  7.8*  --  6.1  --   HGB 17.9* 16.6 16.1 16.3 14.8 16.6  HCT 53.2* 48.8 47.1 49.6 45.8 48.6  MCV 99.4 98.8 98.3  --  99.8 98.4  PLT 158 148* 128*  --  115* 92*   Cardiac Enzymes: No results for input(s): "CKTOTAL", "CKMB", "CKMBINDEX", "TROPONINI" in the last 168 hours. BNP: Invalid input(s): "POCBNP" CBG: No results for input(s): "GLUCAP" in the last 168 hours. D-Dimer No results for input(s): "DDIMER" in the last 72 hours. Hgb A1c No results for input(s): "HGBA1C" in the last 72 hours. Lipid Profile No results for input(s): "CHOL", "HDL", "LDLCALC",  "TRIG", "CHOLHDL", "LDLDIRECT" in the last 72 hours. Thyroid function studies No results for input(s): "TSH", "T4TOTAL", "T3FREE", "THYROIDAB" in the last 72 hours.  Invalid input(s): "FREET3" Anemia work up No results for input(s): "VITAMINB12", "FOLATE", "FERRITIN", "TIBC", "IRON", "RETICCTPCT" in the last 72 hours. Urinalysis    Component Value Date/Time   COLORURINE YELLOW 04/04/2020 1925   APPEARANCEUR Clear 03/05/2023 1439   LABSPEC 1.016 04/04/2020 1925   PHURINE 6.0 04/04/2020 1925   GLUCOSEU Negative 03/05/2023 1439   HGBUR MODERATE (A) 04/04/2020 1925   BILIRUBINUR Negative 03/05/2023 1439   KETONESUR trace (5) (A) 12/03/2020 1104   KETONESUR NEGATIVE 04/04/2020 1925   PROTEINUR 1+ (A) 03/05/2023 1439   PROTEINUR 100 (A) 04/04/2020 1925   UROBILINOGEN 0.2 12/03/2020 1104   UROBILINOGEN 0.2 07/25/2012 0446   NITRITE Negative 03/05/2023 1439   NITRITE NEGATIVE 04/04/2020 1925   LEUKOCYTESUR Negative 03/05/2023 1439   LEUKOCYTESUR LARGE (A) 04/04/2020 1925   Sepsis Labs Recent Labs  Lab 05/25/23 0404 05/26/23 0505 05/27/23 0329 05/29/23 0941  WBC 7.6 9.7 8.0 7.0   Microbiology No results found for this or any previous visit (from the past 240 hours).   Time coordinating discharge: Over 30 minutes  SIGNED:   Marinda Elk, MD  [facility] 05/31/2023, 6:49 AM Pager   If 7PM-7AM, please contact night-coverage [email or web address] Password [email or web address]

## 2023-05-31 NOTE — Plan of Care (Addendum)
Patient ID: JAYDENN MALINA, male   DOB: 01/30/1941, 83 y.o.   MRN: [id number]  Problem: Health Behavior/Discharge Planning: Goal: Ability to manage health-related needs will improve Outcome: Adequate for Discharge   Problem: Clinical Measurements: Goal: Ability to maintain clinical measurements within normal limits will improve Outcome: Adequate for Discharge Goal: Will remain free from infection Outcome: Adequate for Discharge Goal: Diagnostic test results will improve Outcome: Adequate for Discharge   Problem: Elimination: Goal: Will not experience complications related to bowel motility Outcome: Adequate for Discharge   Problem: Pain Management: Goal: General experience of comfort will improve Outcome: Adequate for Discharge   Problem: Skin Integrity: Goal: Risk for impaired skin integrity will decrease Outcome: Adequate for Discharge   Problem: Activity: Goal: Capacity to carry out activities will improve Outcome: Adequate for Discharge   Problem: Acute Rehab PT Goals(only PT should resolve) Goal: Patient Will Transfer Sit To/From Stand Outcome: Adequate for Discharge Goal: Pt Will Ambulate Outcome: Adequate for Discharge   Lidia Collum, RN

## 2023-05-31 NOTE — Plan of Care (Addendum)
  Problem: Health Behavior/Discharge Planning: Goal: Ability to manage health-related needs will improve Outcome: Progressing   Problem: Clinical Measurements: Goal: Ability to maintain clinical measurements within normal limits will improve Outcome: Progressing Goal: Will remain free from infection Outcome: Progressing Goal: Diagnostic test results will improve Outcome: Progressing   Problem: Elimination: Goal: Will not experience complications related to bowel motility Outcome: Progressing   Problem: Pain Management: Goal: General experience of comfort will improve Outcome: Progressing   Problem: Activity: Goal: Capacity to carry out activities will improve Outcome: Progressing

## 2023-05-31 NOTE — TOC Transition Note (Signed)
Transition of Care [facility]) - Discharge Note   Patient Details  Name: Angel [name] MRN: [id number] Date of Birth: 1940/07/30  Transition of Care [facility]) CM/SW Contact:  Villa Herb, LCSWA Phone Number: 05/31/2023, 10:42 AM   Clinical Narrative:    CSW spoke with pts daughter who states she spoke with her father again this morning to confirm his wish for discharge to SNF vs home with [facility]. Pts daughter states that pt wants to return home with [facility] and will not agree to SNF placement. CSW confirmed pt has home O2 supplied through [employer]. CSW spoke to weekend rep who states they will deliver a tankl to the hospital for pt to go home with. CSW updated RN that once tank has arrive Diplomatic Services operational officer can call for [facility] to transport patient home. TOC signing off.   Final next level of care: Home w Home Health Services Barriers to Discharge: Barriers Resolved   Patient Goals and CMS Choice Patient states their goals for this hospitalization and ongoing recovery are:: return home CMS Medicare.gov Compare Post Acute Care list provided to:: Patient Choice offered to / list presented to : Patient [place] ownership interest in [facility].provided to:: Patient    Discharge Placement                    Patient and family notified of of transfer: 05/28/23  Discharge Plan and Services Additional resources added to the After Visit Summary for   In-house Referral: Clinical Social Work   Post Acute Care Choice: Home Health                    HH Arranged: PT [facility] Agency: [facility] Date HH Agency Contacted: 05/31/23 Time HH Agency Contacted: [id number] Representative spoke with at [facility] Agency: Adele Dan  Social Drivers of Health (SDOH) Interventions SDOH Screenings   Food Insecurity: No Food Insecurity (05/25/2023)  Housing: Low Risk  (05/25/2023)  Transportation Needs: No Transportation Needs (05/25/2023)  Utilities: Not At Risk (05/25/2023)  Financial Resource Strain: Low  Risk  (10/28/2022)   Received from [facility]  Physical Activity: Unknown (10/28/2022)   Received from [facility]  Recent Concern: Physical Activity - Inactive (10/28/2022)   Received from [facility]  Social Connections: Socially Isolated (05/25/2023)  Stress: No Stress Concern Present (10/28/2022)   Received from [facility]  Tobacco Use: Medium Risk (05/27/2023)     Readmission Risk Interventions    05/28/2023   11:19 AM  Readmission Risk Prevention Plan  Post Dischage Appt Not Complete  Medication Screening Complete  Transportation Screening Complete

## 2023-06-03 ENCOUNTER — Ambulatory Visit: Payer: Self-pay | Admitting: Cardiology
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
## 2023-06-09 ENCOUNTER — Ambulatory Visit: Payer: 59 | Admitting: Internal Medicine

## 2023-06-09 ENCOUNTER — Emergency Department (HOSPITAL_COMMUNITY): Payer: 59

## 2023-06-09 ENCOUNTER — Inpatient Hospital Stay (HOSPITAL_COMMUNITY)
Admission: EM | Admit: 2023-06-09 | Discharge: 2023-06-11 | DRG: 683 | Disposition: A | Discharge status: Home Health | Payer: 59 | Attending: Internal Medicine | Admitting: Internal Medicine

## 2023-06-09 DIAGNOSIS — I272 Pulmonary hypertension, unspecified: Secondary | ICD-10-CM | POA: Diagnosis present

## 2023-06-09 DIAGNOSIS — I130 Hypertensive heart and chronic kidney disease with heart failure and stage 1 through stage 4 chronic kidney disease, or unspecified chronic kidney disease: Secondary | ICD-10-CM | POA: Diagnosis present

## 2023-06-09 DIAGNOSIS — J9611 Chronic respiratory failure with hypoxia: Secondary | ICD-10-CM | POA: Diagnosis present

## 2023-06-09 DIAGNOSIS — Z9981 Dependence on supplemental oxygen: Secondary | ICD-10-CM

## 2023-06-09 DIAGNOSIS — Z87891 Personal history of nicotine dependence: Secondary | ICD-10-CM

## 2023-06-09 DIAGNOSIS — E785 Hyperlipidemia, unspecified: Secondary | ICD-10-CM | POA: Diagnosis present

## 2023-06-09 DIAGNOSIS — N1330 Unspecified hydronephrosis: Secondary | ICD-10-CM | POA: Diagnosis present

## 2023-06-09 DIAGNOSIS — N1831 Chronic kidney disease, stage 3a: Secondary | ICD-10-CM | POA: Diagnosis present

## 2023-06-09 DIAGNOSIS — I5032 Chronic diastolic (congestive) heart failure: Secondary | ICD-10-CM | POA: Diagnosis present

## 2023-06-09 DIAGNOSIS — Z85828 Personal history of other malignant neoplasm of skin: Secondary | ICD-10-CM

## 2023-06-09 DIAGNOSIS — D6959 Other secondary thrombocytopenia: Secondary | ICD-10-CM | POA: Diagnosis present

## 2023-06-09 DIAGNOSIS — Z881 Allergy status to other antibiotic agents status: Secondary | ICD-10-CM

## 2023-06-09 DIAGNOSIS — K219 Gastro-esophageal reflux disease without esophagitis: Secondary | ICD-10-CM | POA: Diagnosis present

## 2023-06-09 DIAGNOSIS — R339 Retention of urine, unspecified: Secondary | ICD-10-CM

## 2023-06-09 DIAGNOSIS — Z79899 Other long term (current) drug therapy: Secondary | ICD-10-CM | POA: Diagnosis not present

## 2023-06-09 DIAGNOSIS — I2510 Atherosclerotic heart disease of native coronary artery without angina pectoris: Secondary | ICD-10-CM | POA: Diagnosis present

## 2023-06-09 DIAGNOSIS — I252 Old myocardial infarction: Secondary | ICD-10-CM

## 2023-06-09 DIAGNOSIS — I3139 Other pericardial effusion (noninflammatory): Secondary | ICD-10-CM | POA: Diagnosis present

## 2023-06-09 DIAGNOSIS — C641 Malignant neoplasm of right kidney, except renal pelvis: Secondary | ICD-10-CM | POA: Diagnosis present

## 2023-06-09 DIAGNOSIS — Z86718 Personal history of other venous thrombosis and embolism: Secondary | ICD-10-CM | POA: Diagnosis not present

## 2023-06-09 DIAGNOSIS — K7460 Unspecified cirrhosis of liver: Secondary | ICD-10-CM | POA: Diagnosis present

## 2023-06-09 DIAGNOSIS — C3432 Malignant neoplasm of lower lobe, left bronchus or lung: Secondary | ICD-10-CM | POA: Diagnosis present

## 2023-06-09 DIAGNOSIS — Z9861 Coronary angioplasty status: Secondary | ICD-10-CM

## 2023-06-09 DIAGNOSIS — Z87442 Personal history of urinary calculi: Secondary | ICD-10-CM

## 2023-06-09 DIAGNOSIS — R319 Hematuria, unspecified: Secondary | ICD-10-CM | POA: Diagnosis present

## 2023-06-09 DIAGNOSIS — E876 Hypokalemia: Secondary | ICD-10-CM | POA: Diagnosis present

## 2023-06-09 DIAGNOSIS — R188 Other ascites: Secondary | ICD-10-CM | POA: Diagnosis present

## 2023-06-09 DIAGNOSIS — R531 Weakness: Secondary | ICD-10-CM | POA: Diagnosis present

## 2023-06-09 DIAGNOSIS — Z7982 Long term (current) use of aspirin: Secondary | ICD-10-CM | POA: Diagnosis not present

## 2023-06-09 DIAGNOSIS — R5381 Other malaise: Secondary | ICD-10-CM | POA: Diagnosis present

## 2023-06-09 DIAGNOSIS — N189 Chronic kidney disease, unspecified: Secondary | ICD-10-CM | POA: Diagnosis present

## 2023-06-09 DIAGNOSIS — N139 Obstructive and reflux uropathy, unspecified: Secondary | ICD-10-CM | POA: Diagnosis not present

## 2023-06-09 DIAGNOSIS — Z880 Allergy status to penicillin: Secondary | ICD-10-CM

## 2023-06-09 DIAGNOSIS — N179 Acute kidney failure, unspecified: Secondary | ICD-10-CM | POA: Diagnosis present

## 2023-06-09 DIAGNOSIS — N400 Enlarged prostate without lower urinary tract symptoms: Secondary | ICD-10-CM | POA: Diagnosis present

## 2023-06-09 DIAGNOSIS — Z888 Allergy status to other drugs, medicaments and biological substances status: Secondary | ICD-10-CM

## 2023-06-09 DIAGNOSIS — N1832 Chronic kidney disease, stage 3b: Secondary | ICD-10-CM | POA: Diagnosis not present

## 2023-06-09 DIAGNOSIS — Z885 Allergy status to narcotic agent status: Secondary | ICD-10-CM

## 2023-06-09 LAB — CBC WITH DIFFERENTIAL/PLATELET
Abs Immature Granulocytes: 0.04 10*3/uL (ref 0.00–0.07)
Basophils Absolute: 0 10*3/uL (ref 0.0–0.1)
Basophils Relative: 0 %
Eosinophils Absolute: 0.1 10*3/uL (ref 0.0–0.5)
Eosinophils Relative: 1 %
HCT: 45.2 % (ref 39.0–52.0)
Hemoglobin: 15.6 g/dL (ref 13.0–17.0)
Immature Granulocytes: 1 %
Lymphocytes Relative: 10 %
Lymphs Abs: 0.8 10*3/uL (ref 0.7–4.0)
MCH: 33.5 pg (ref 26.0–34.0)
MCHC: 34.5 g/dL (ref 30.0–36.0)
MCV: 97.2 fL (ref 80.0–100.0)
Monocytes Absolute: 0.6 10*3/uL (ref 0.1–1.0)
Monocytes Relative: 7 %
Neutro Abs: 6.8 10*3/uL (ref 1.7–7.7)
Neutrophils Relative %: 81 %
Platelets: 122 10*3/uL — ABNORMAL LOW (ref 150–400)
RBC: 4.65 MIL/uL (ref 4.22–5.81)
RDW: 14.5 % (ref 11.5–15.5)
WBC: 8.3 10*3/uL (ref 4.0–10.5)
nRBC: 0 % (ref 0.0–0.2)

## 2023-06-09 LAB — BASIC METABOLIC PANEL
Anion gap: 12 (ref 5–15)
BUN: 67 mg/dL — ABNORMAL HIGH (ref 8–23)
CO2: 23 mmol/L (ref 22–32)
Calcium: 8.3 mg/dL — ABNORMAL LOW (ref 8.9–10.3)
Chloride: 94 mmol/L — ABNORMAL LOW (ref 98–111)
Creatinine, Ser: 4.31 mg/dL — ABNORMAL HIGH (ref 0.61–1.24)
GFR, Estimated: 13 mL/min — ABNORMAL LOW (ref >60)
Glucose, Bld: 85 mg/dL (ref 70–99)
Potassium: 3.7 mmol/L (ref 3.5–5.1)
Sodium: 129 mmol/L — ABNORMAL LOW (ref 135–145)

## 2023-06-09 LAB — URINALYSIS, W/ REFLEX TO CULTURE (INFECTION SUSPECTED)
Bacteria, UA: NONE SEEN
Bilirubin Urine: NEGATIVE
Glucose, UA: NEGATIVE mg/dL
Ketones, ur: NEGATIVE mg/dL
Nitrite: NEGATIVE
Protein, ur: NEGATIVE mg/dL
RBC / HPF: >50 RBC/hpf (ref 0–5)
Specific Gravity, Urine: 1.009 (ref 1.005–1.030)
pH: 5 (ref 5.0–8.0)

## 2023-06-09 LAB — HEPATIC FUNCTION PANEL
ALT: 7 U/L (ref 0–44)
AST: 10 U/L — ABNORMAL LOW (ref 15–41)
Albumin: 3.1 g/dL — ABNORMAL LOW (ref 3.5–5.0)
Alkaline Phosphatase: 61 U/L (ref 38–126)
Bilirubin, Direct: 0.6 mg/dL — ABNORMAL HIGH (ref 0.0–0.2)
Indirect Bilirubin: 1.1 mg/dL — ABNORMAL HIGH (ref 0.3–0.9)
Total Bilirubin: 1.7 mg/dL — ABNORMAL HIGH (ref 0.0–1.2)
Total Protein: 6.1 g/dL — ABNORMAL LOW (ref 6.5–8.1)

## 2023-06-09 LAB — BRAIN NATRIURETIC PEPTIDE: B Natriuretic Peptide: 1485 pg/mL — ABNORMAL HIGH (ref 0.0–100.0)

## 2023-06-09 LAB — CK: Total CK: 42 U/L — ABNORMAL LOW (ref 49–397)

## 2023-06-09 MED ORDER — LACTATED RINGERS IV BOLUS: 1000.0000 mL | Freq: Once | INTRAVENOUS | Status: DC

## 2023-06-09 MED ORDER — SENNOSIDES-DOCUSATE SODIUM 8.6-50 MG PO TABS: 1.0000 | ORAL_TABLET | Freq: Every evening | ORAL | Status: DC | PRN

## 2023-06-09 MED ORDER — TAMSULOSIN HCL 0.4 MG PO CAPS
0.4000 mg | ORAL_CAPSULE | Freq: Every day | ORAL | Status: DC
  Administered 2023-06-09 – 2023-06-11 (×3): 0.4 mg via ORAL
  Filled 2023-06-09 (×3): qty 1

## 2023-06-09 MED ORDER — ACETAMINOPHEN 325 MG PO TABS: 650.0000 mg | ORAL_TABLET | Freq: Four times a day (QID) | ORAL | Status: DC | PRN

## 2023-06-09 MED ORDER — ENOXAPARIN SODIUM 30 MG/0.3ML IJ SOSY
30.0000 mg | PREFILLED_SYRINGE | INTRAMUSCULAR | Status: DC
Start: 2023-06-09 — End: 2023-06-11
  Administered 2023-06-09 – 2023-06-10 (×2): 30 mg via SUBCUTANEOUS
  Filled 2023-06-09 (×2): qty 0.3

## 2023-06-09 MED ORDER — LACTATED RINGERS IV BOLUS
500.0000 mL | Freq: Once | INTRAVENOUS | Status: AC
  Administered 2023-06-09: 500 mL via INTRAVENOUS

## 2023-06-09 MED ORDER — ONDANSETRON HCL 4 MG/2ML IJ SOLN
4.0000 mg | Freq: Four times a day (QID) | INTRAMUSCULAR | Status: DC | PRN
  Administered 2023-06-10 (×2): 4 mg via INTRAVENOUS
  Filled 2023-06-09 (×2): qty 2

## 2023-06-09 MED ORDER — ACETAMINOPHEN 650 MG RE SUPP
650.0000 mg | Freq: Four times a day (QID) | RECTAL | Status: DC | PRN
Start: 2023-06-09 — End: 2023-06-11

## 2023-06-09 MED ORDER — ONDANSETRON HCL 4 MG PO TABS: 4.0000 mg | ORAL_TABLET | Freq: Four times a day (QID) | ORAL | Status: DC | PRN

## 2023-06-09 NOTE — ED Provider Notes (Signed)
Care of patient assumed from Dr. Rubin Payor.  This patient presents with bilateral distal thigh pain as well as left-sided abdominal pain.  He was recently admitted for CHF exacerbation.  He was diuresed 8 L and discharged home 9 days ago.  Initial lab work notable for AKI. Physical Exam  BP 107/83 (BP Location: Left Arm)   Pulse 61   Temp 97.6 F (36.4 C) (Axillary)   Resp 14   SpO2 99%   Physical Exam Vitals and nursing note reviewed.  Constitutional:      General: He is not in acute distress.    Appearance: Normal appearance. He is well-developed. He is ill-appearing (Chronically). He is not toxic-appearing or diaphoretic.  HENT:     Head: Normocephalic and atraumatic.     Right Ear: External ear normal.     Left Ear: External ear normal.     Nose: Nose normal.     Mouth/Throat:     Mouth: Mucous membranes are moist.  Eyes:     Extraocular Movements: Extraocular movements intact.     Conjunctiva/sclera: Conjunctivae normal.  Cardiovascular:     Rate and Rhythm: Normal rate and regular rhythm.  Pulmonary:     Effort: Pulmonary effort is normal. No respiratory distress.  Abdominal:     Palpations: Abdomen is soft.  Musculoskeletal:        General: No swelling. Normal range of motion.     Cervical back: Normal range of motion and neck supple.  Skin:    General: Skin is warm and dry.     Coloration: Skin is not jaundiced or pale.  Neurological:     General: No focal deficit present.     Mental Status: He is alert and oriented to person, place, and time.  Psychiatric:        Mood and Affect: Mood normal.        Behavior: Behavior normal.     Procedures  Procedures  ED Course / MDM    Medical Decision Making Amount and/or Complexity of Data Reviewed Labs: ordered. Radiology: ordered.  Risk Decision regarding hospitalization.   On assessment, patient resting in left lateral decubitus position.  He states that he has not been taking Lasix.  He has been eating  normally.  He typically does not drink much fluids.  On CT scan, patient found to have distended bladder.  Foley catheter was placed.  Urine output was dark in color.  I suspect obstructive cause as source of AKI.  Gentle IV fluids were initiated.  Patient to be admitted for further management.       Gloris Manchester, MD 06/09/23 [phone]

## 2023-06-09 NOTE — H&P (Signed)
History and Physical  Angel [name]:[id number] DOB: June 01, 1940 DOA: 06/09/2023  PCP: Tracey Harries, MD   Chief Complaint: Abdominal pain, thigh pain  HPI: Angel [name] is a 83 y.o. male with medical history significant for HTN, HLD, MI/CAD, pulmonary hypertension, CKD 3A, chronic hypoxic respiratory failure on 2 to 3 L [place], kidney stones, chronic diastolic HF, GERD, renal mass and bladder calculi who presented to the ED for evaluation of abdominal pain and thigh pain. Patient recently had a 1 week hospitalization about 3 weeks ago for CHF exacerbation, he was diuresed 8 L and discharged home on Lasix with instructions to follow-up with cardiology and pulmonology. He presents today with pain in his upper thigh and left abdomen. He also reports mild lower extremity swelling. He reports taking his Lasix since discharge from the hospital but not consistently. He also endorsed decrease p.o. intake and urine output as well as generalized weakness over the last few days but denies any fevers, chills, chest pain, dizziness or worsening shortness of breath.  ED Course: Initial vitals stable, on baseline 2 L [place]. Labs significant for sodium 129, BUN/creatinine 67/4.31, BNP 1485, UA with large hemoglobinuria but no signs of infection.  CT chest/abdomen/pelvis shows stable small bilateral pleural effusions, stable small pericardial effusion, similar 12 mm LUL spiculated nodule, similar 8.9 cm right renal mass, evidence of cirrhosis and small ascites as well as mild bilateral hydroureteronephrosis and distended bladder likely due to obstructive uropathy from enlarged prostate. RLE U/S DVT was negative for DVT.   Patient was given IV LR 500 cc bolus.  Foley catheter was placed. TRH was consulted for admission  Review of Systems: Please see HPI for pertinent positives and negatives. A complete 10 system review of systems are otherwise negative.  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   Past Surgical History:  Procedure Laterality Date   COLONOSCOPY     COLONOSCOPY     CORONARY ANGIOPLASTY  2010   Dr Eden Emms   CYSTOSCOPY WITH LITHOLAPAXY N/A 02/24/2017   Procedure: CYSTOSCOPY WITH LITHOLAPAXY;  Surgeon: Malen Gauze, MD;  Location: [facility];  Service: Urology;  Laterality: N/A;   CYSTOSCOPY WITH URETHRAL DILATATION  02/24/2017   Procedure: CYSTOSCOPY WITH URETHRAL DILATATION;  Surgeon: Malen Gauze, MD;  Location: [facility];  Service: Urology;;   DOPPLER ECHOCARDIOGRAPHY     EAR CYST EXCISION Left 07/23/2012   Procedure: EXCISION LEFT EAR LOBE;  Surgeon: Suzanna Obey, MD;  Location: [facility];  Service: ENT;  Laterality: Left;   HOLMIUM LASER APPLICATION N/A 02/24/2017   Procedure: HOLMIUM LASER APPLICATION OF BLADDER CALCULI;  Surgeon: Malen Gauze, MD;  Location: [facility];  Service: Urology;  Laterality: N/A;   IR RADIOLOGIST EVAL & MGMT  11/28/2016  Social History:  reports that he quit smoking about 13 years ago. His smoking use included cigarettes. He started smoking about 58 years ago. He has a 67.5 pack-year smoking history. He has never used smokeless tobacco. He reports that he does not drink alcohol and does not use drugs.  Allergies  Allergen Reactions   Ciprofloxacin Other (See Comments)   Crestor [Rosuvastatin] Other (See Comments)    Unknown    Penicillins Other (See Comments)    Per patient causes GI upset with high doses. He says he does OK with  shots but gets GI upset with PO penicillins Can take small doses with no issues   Tramadol Other (See Comments)    Confusion   Lisinopril Rash and Cough    Patient says it caused bp to increase and caused a rash on his legs    Macrodantin [Nitrofurantoin] Rash    No family history on file.   Prior to Admission medications   Medication Sig Start Date End Date Taking? Authorizing Provider  aspirin EC 81 MG tablet Take 81 mg by mouth daily.   Yes [provider]  furosemide (LASIX) 40 MG tablet Take 1 tablet (40 mg total) by mouth daily. 05/30/23  Yes Marinda Elk, MD  ibuprofen (ADVIL) 200 MG tablet Take 200 mg by mouth daily as needed for moderate pain (pain score 4-6).   Yes [provider]  metoprolol tartrate (LOPRESSOR) 25 MG tablet Take 0.5 tablets (12.5 mg total) by mouth 2 (two) times daily. 05/28/23  Yes Marinda Elk, MD  nitroGLYCERIN (NITROSTAT) 0.4 MG SL tablet Place 1 tablet (0.4 mg total) under the tongue every 5 (five) minutes x 3 doses as needed for chest pain (if after 3rd dose no relief proceed to ED or call 911). 04/03/23  Yes BranchDorothe Pea, MD  tamsulosin (FLOMAX) 0.4 MG CAPS capsule Take 1 capsule (0.4 mg total) by mouth daily after supper. 05/28/23  Yes Marinda Elk, MD    Physical Exam: BP 118/68 (BP Location: Left Arm)   Pulse 68   Temp (!) 97.2 F (36.2 C)   Resp 17   SpO2 95%  General: Pleasant, chronically ill-appearing elderly man laying on side in bed. No acute distress. HEENT: [place]/AT. Anicteric sclera CV: Regular rate. Regular rhythm with occasional extra beats. Soft systolic murmur. Trace BLE edema. Pulmonary: On 2 L [place]. Lungs CTAB. Normal effort. Distant rales at the bases. Abdominal: Soft, nondistended.  Mild lower abd ttp. Normal bowel sounds. Extremities: Palpable radial and DP pulses. Normal ROM. Skin: Warm and dry. No obvious rash or lesions. GU: Foley catheter stable in place with pinkish urine in  bag Neuro: A&Ox3. Moves all extremities. Normal sensation to light touch. No focal deficit. Psych: Normal mood and affect          Labs on Admission:  Basic Metabolic Panel: Recent Labs  Lab 06/09/23 1446  NA 129*  K 3.7  CL 94*  CO2 23  GLUCOSE 85  BUN 67*  CREATININE 4.31*  CALCIUM 8.3*   Liver Function Tests: Recent Labs  Lab 06/09/23 1446  AST 10*  ALT 7  ALKPHOS 61  BILITOT 1.7*  PROT 6.1*  ALBUMIN 3.1*   No results for input(s): "LIPASE", "AMYLASE" in the last 168 hours. No results for input(s): "AMMONIA" in the last 168 hours. CBC: Recent Labs  Lab 06/09/23 1446  WBC 8.3  NEUTROABS 6.8  HGB 15.6  HCT 45.2  MCV 97.2  PLT 122*  Cardiac Enzymes: Recent Labs  Lab 06/09/23 1446  CKTOTAL 42*   BNP (last 3 results) Recent Labs    05/27/23 0329 05/29/23 0941 06/09/23 1446  BNP 1,721.0* 1,068.0* 1,485.0*    ProBNP (last 3 results) No results for input(s): "PROBNP" in the last 8760 hours.  CBG: No results for input(s): "GLUCAP" in the last 168 hours.  Radiological Exams on Admission: CT CHEST ABDOMEN PELVIS WO CONTRAST Result Date: 06/09/2023 CLINICAL DATA:  Unintended weight loss and left-sided abdominal pain. Known kidney cancer on the right side. EXAM: CT CHEST, ABDOMEN AND PELVIS WITHOUT CONTRAST TECHNIQUE: Multidetector CT imaging of the chest, abdomen and pelvis was performed following the standard protocol without IV contrast. RADIATION DOSE REDUCTION: This exam was performed according to the departmental dose-optimization program which includes automated exposure control, adjustment of the mA and/or kV according to patient size and/or use of iterative reconstruction technique. COMPARISON:  Renal ultrasound 05/27/2023; CTA chest 05/25/2023; MRI abdomen 07/30/2021; CT chest abdomen and pelvis 04/04/2020 FINDINGS: CT CHEST FINDINGS Cardiovascular: Similar small pericardial effusion. Coronary artery and aortic atherosclerotic calcification.  Mediastinum/Nodes: Trachea and esophagus are unremarkable. No thoracic adenopathy noting decreased sensitivity on noncontrast exam. Lungs/Pleura: Small bilateral pleural effusions similar to 05/25/2023. Similar 12 mm spiculated nodule in the left upper lobe (2/14). Numerous additional subcentimeter nodules are also unchanged. Biapical pleural-parenchymal scarring. No pneumothorax. Musculoskeletal: No acute fracture or destructive osseous lesion. CT ABDOMEN PELVIS FINDINGS Hepatobiliary: Nodular hepatic contour compatible with cirrhosis. Cholelithiasis. No biliary dilation. Pancreas: Unremarkable. Spleen: Unremarkable. Adrenals/Urinary Tract: Stable adrenal glands. 8.9 x 7.4 cm heterogenous right renal mass is grossly similar in size to ultrasound 05/27/2023 given differences in technique. No renal or ureteral calculi. Mild bilateral hydroureteronephrosis. Bladder wall calcifications or layering stones in the right posterior bladder. The bladder is distended. Stomach/Bowel: Normal caliber large and small bowel. Colonic diverticulosis without diverticulitis. Stomach is within normal limits. Vascular/Lymphatic: 3.2 cm infrarenal abdominal aortic aneurysm. Aortic atherosclerotic calcification. No lymphadenopathy. Reproductive: Enlarged prostate. Other: Small volume abdominopelvic ascites. Mesenteric edema and vascular engorgement in the central mesentery. Musculoskeletal: No acute fracture or destructive osseous lesion. IMPRESSION: 1. Similar small bilateral pleural effusions 2. Similar small pericardial effusion. 3. Similar 12 mm spiculated nodule in the left upper lobe concerning for malignancy. Numerous additional subcentimeter nodules are also unchanged. 4. 8.9 cm heterogenous right renal mass is grossly similar in size to ultrasound 05/27/2023 given differences in technique. 5. Mild bilateral hydroureteronephrosis and distended bladder likely due to obstructive uropathy from enlarged prostate. 6. Nodular hepatic  contour compatible with cirrhosis and small volume abdominopelvic ascites. 7. 3.2 cm infrarenal abdominal aortic aneurysm. Recommend follow-up ultrasound every 3 years. Aortic Atherosclerosis (ICD10-I70.0). Electronically Signed   By: Minerva Fester M.D.   On: 06/09/2023 17:02   US Venous Img Lower Unilateral Right Result Date: 06/09/2023 CLINICAL DATA:  edema EXAM: RIGHT LOWER EXTREMITY VENOUS DOPPLER ULTRASOUND TECHNIQUE: Gray-scale sonography with compression, as well as color and duplex ultrasound, were performed to evaluate the deep venous system(s) from the level of the common femoral vein through the popliteal and proximal calf veins. COMPARISON:  Lower extremity venous duplex, 05/25/2023. FINDINGS: VENOUS Normal compressibility of the common femoral, superficial femoral, and popliteal veins, as well as the visualized calf veins. Visualized portions of profunda femoral vein and great saphenous vein unremarkable. No filling defects to suggest DVT on grayscale or color Doppler imaging. Doppler waveforms show normal direction of venous flow, normal respiratory plasticity and response to augmentation. Limited views of the contralateral common femoral  vein are unremarkable. OTHER No evidence of superficial thrombophlebitis or abnormal fluid collection. Limitations: none IMPRESSION: No evidence of femoropopliteal DVT or superficial thrombophlebitis within the RIGHT lower extremity. Roanna Banning, MD [facility] Electronically Signed   By: Roanna Banning M.D.   On: 06/09/2023 16:07   Assessment/Plan Angel [name] is a 83 y.o. male with medical history significant for HTN, HLD, MI/CAD, pulmonary hypertension, chronic hypoxic respiratory failure on 2 to 3 L [place], kidney stones, CKD 3A, chronic diastolic HF, GERD, renal mass and bladder calculi who presented to the ED for evaluation of abdominal pain and thigh pain and found to have AKI on CKD 3A.  # Acute  on chronic renal failure # Bilateral hydroureteronephrosis # Distended bladder Patient with history of diastolic heart failure, severe pulmonary hypertension and CKD 3A presented with lower abdominal pain found to have significant rise in his creatinine from baseline of 1.2-1.3 to 4.31. Patient found to have distended bladder and hydroureteronephrosis on imaging.  AKI likely secondary to urinary obstruction however cardiorenal syndrome likely contributing. -Order for urology consult placed, will need to formally consult them in the morning -Continue Foley catheter -Continue home Flomax -Avoid nephrotoxic's agents -Trend renal function  # Renal mass # Hematuria Currently being followed by patient's urologist. Imaging shows stable 8.9 cm heterogeneous right renal mass. Patient's urologist Dr. Ronne Binning is currently on day call.  -Pending formal urology consult -Continue Foley catheter  # Hx pulmonary hypertension # Chronic diastolic heart failure # Chronic hypoxic respiratory failure on 2-3 L Patient remains on his baseline 2 L [place]. He is in no respiratory distress. Patient not significantly fluid overloaded on exam. Weight is slightly up 4 lbs since hospital discharge. -Telemetry -Resume home Lasix and Lopressor -Strict I&O's, daily weight -Trend and replete electrolytes -Continue supplemental O2  # Cirrhosis # Thrombocytopenia CT scan shows nodular hepatic contour compatible with cirrhosis and small volume abdominal pelvic ascites. Normal LFTs. Albumin 3.1. Platelet low at 122 but improved from 92 2 weeks ago. -Check PT/INR to calculate MELD and child Pugh score -Trend CMP  # Hx MI/CAD -Continue aspirin -Has refused statin therapy   # Hx of lung mass CT scan shows stable 12 mm spiculated nodule in the LUL concerning for malignancy with additional subcentimeter nodules -Follow-up with pulmonology in the outpatient   DVT prophylaxis: Lovenox     Code Status: Full  Code  Consults called: Urology  Family Communication: No family at bedside  Severity of Illness: The appropriate patient status for this patient is INPATIENT. Inpatient status is judged to be reasonable and necessary in order to provide the required intensity of service to ensure the patient's safety. The patient's presenting symptoms, physical exam findings, and initial radiographic and laboratory data in the context of their chronic comorbidities is felt to place them at high risk for further clinical deterioration. Furthermore, it is not anticipated that the patient will be medically stable for discharge from the hospital within 2 midnights of admission.   * I certify that at the point of admission it is my clinical judgment that the patient will require inpatient hospital care spanning beyond 2 midnights from the point of admission due to high intensity of service, high risk for further deterioration and high frequency of surveillance required.*  Level of care: Telemetry   Steffanie Rainwater, MD 06/09/2023, 11:46 PM [facility] Pager: [phone] Isaiah 41:10   If 7PM-7AM, please contact night-coverage [email or web address] Password [email or web address]

## 2023-06-09 NOTE — ED Provider Notes (Addendum)
[place] [facility]PARTMENT AT [facility] Provider Note   CSN: [id number] Arrival date & time: 06/09/23  1217     History  Chief Complaint  Patient presents with   Leg Pain    Angel [name] is a 83 y.o. male.   Leg Pain Patient with pain in both his legs and his abdomen.  Recent admission for volume overload due to pulmonary hypertension.  Reportedly has had previous DVTs.  States he feels that there is swelling in both his legs particularly on the right side.  States it feels as if it on the front on top of the knee.  Also states worse left side abdominal pain.  States he has known kidney cancer on the right side.    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 Authorizing Provider  aspirin EC 81 MG tablet Take 81 mg by mouth daily.    [provider]  furosemide (LASIX) 40 MG tablet Take 1 tablet (40 mg total) by mouth daily. 05/30/23   Marinda Elk, MD  ibuprofen (ADVIL) 200 MG tablet Take 200 mg by mouth daily as needed for moderate pain (pain score 4-6).    [provider]  metoprolol tartrate (LOPRESSOR) 25 MG tablet Take 0.5 tablets (12.5 mg total) by mouth 2 (two) times daily. 05/28/23   Marinda Elk, MD  nitroGLYCERIN (NITROSTAT) 0.4 MG SL tablet Place 1 tablet (0.4 mg total) under the tongue every 5 (five) minutes  x 3 doses as needed for chest pain (if after 3rd dose no relief proceed to ED or call 911). 04/03/23   Antoine Poche, MD  tamsulosin (FLOMAX) 0.4 MG CAPS capsule Take 1 capsule (0.4 mg total) by mouth daily after supper. 05/28/23   Marinda Elk, MD      Allergies    Ciprofloxacin, Crestor [rosuvastatin], Penicillins, Tramadol, Lisinopril, and Macrodantin [nitrofurantoin]    Review of Systems   Review of Systems  Physical Exam Updated Vital Signs BP 107/83 (BP Location: Left Arm)   Pulse 61   Temp 97.6 F (36.4 C) (Axillary)   Resp 14   SpO2 99%  Physical Exam Vitals and nursing note reviewed.  Abdominal:     Tenderness: There is abdominal tenderness.     Comments: Left side abdominal tenderness no rebound or guarding.  No hernia palpated.  Musculoskeletal:        General: Tenderness present.     Comments: Some tenderness to right lower extremity.  Mild edema.  No specific left lower extremity swelling.  Neurological:     Mental Status: He is alert. Mental status is at baseline.  ED Results / Procedures / Treatments   Labs (all labs ordered are listed, but only abnormal results are displayed) Labs Reviewed  BASIC METABOLIC PANEL - Abnormal; Notable for the following components:      Result Value   Sodium 129 (*)    Chloride 94 (*)    BUN 67 (*)    Creatinine, Ser 4.31 (*)    Calcium 8.3 (*)    GFR, Estimated 13 (*)    All other components within normal limits  CBC WITH DIFFERENTIAL/PLATELET - Abnormal; Notable for the following components:   Platelets 122 (*)    All other components within normal limits  URINALYSIS, W/ REFLEX TO CULTURE (INFECTION SUSPECTED)    EKG None  Radiology No results found.  Procedures Procedures    Medications Ordered in ED Medications - No data to display  ED Course/ Medical Decision Making/ A&P                                 Medical Decision Making Amount and/or Complexity of Data Reviewed Labs:  ordered.   Patient abdominal pain and reported bilateral leg pain.  States it is on his thighs.  However worse on the right side.  Will get ultrasound to evaluate.  Did have recent ultrasound while inpatient but did have higher risk now due to being inpatient.  On chronic oxygen.  With abdominal pain and left-sided tenderness will get CT scan to evaluate.  I have reviewed recent discharge note.  Care turned over to Dr. Durwin Nora  Creatinine increased to 4.  Likely require admission to the hospital.  Potentially cardiorenal versus overdiuresis.        Final Clinical Impression(s) / ED Diagnoses Final diagnoses:  AKI (acute kidney injury) [facility])    Rx / DC Orders ED Discharge Orders     None         Benjiman Core, MD 06/09/23 1514    Benjiman Core, MD 06/09/23 1521

## 2023-06-09 NOTE — ED Triage Notes (Signed)
Pt arrives via [facility] from home c/o bilateral femur pain. Pt denies recent injury/trauma.

## 2023-06-10 ENCOUNTER — Encounter (HOSPITAL_COMMUNITY): Payer: Self-pay | Admitting: Student

## 2023-06-10 ENCOUNTER — Other Ambulatory Visit: Payer: Self-pay

## 2023-06-10 ENCOUNTER — Inpatient Hospital Stay (HOSPITAL_COMMUNITY): Payer: 59

## 2023-06-10 DIAGNOSIS — N1832 Chronic kidney disease, stage 3b: Secondary | ICD-10-CM

## 2023-06-10 DIAGNOSIS — I5032 Chronic diastolic (congestive) heart failure: Secondary | ICD-10-CM

## 2023-06-10 DIAGNOSIS — R339 Retention of urine, unspecified: Secondary | ICD-10-CM

## 2023-06-10 DIAGNOSIS — N179 Acute kidney failure, unspecified: Secondary | ICD-10-CM | POA: Diagnosis not present

## 2023-06-10 DIAGNOSIS — N1330 Unspecified hydronephrosis: Secondary | ICD-10-CM

## 2023-06-10 DIAGNOSIS — N139 Obstructive and reflux uropathy, unspecified: Secondary | ICD-10-CM

## 2023-06-10 LAB — COMPREHENSIVE METABOLIC PANEL
ALT: 5 U/L (ref 0–44)
AST: 7 U/L — ABNORMAL LOW (ref 15–41)
Albumin: 2.8 g/dL — ABNORMAL LOW (ref 3.5–5.0)
Alkaline Phosphatase: 53 U/L (ref 38–126)
Anion gap: 14 (ref 5–15)
BUN: 62 mg/dL — ABNORMAL HIGH (ref 8–23)
CO2: 21 mmol/L — ABNORMAL LOW (ref 22–32)
Calcium: 8.4 mg/dL — ABNORMAL LOW (ref 8.9–10.3)
Chloride: 101 mmol/L (ref 98–111)
Creatinine, Ser: 3.86 mg/dL — ABNORMAL HIGH (ref 0.61–1.24)
GFR, Estimated: 15 mL/min — ABNORMAL LOW (ref >60)
Glucose, Bld: 70 mg/dL (ref 70–99)
Potassium: 3.4 mmol/L — ABNORMAL LOW (ref 3.5–5.1)
Sodium: 136 mmol/L (ref 135–145)
Total Bilirubin: 1.4 mg/dL — ABNORMAL HIGH (ref 0.0–1.2)
Total Protein: 5.4 g/dL — ABNORMAL LOW (ref 6.5–8.1)

## 2023-06-10 LAB — MAGNESIUM: Magnesium: 1.9 mg/dL (ref 1.7–2.4)

## 2023-06-10 LAB — CBC
HCT: 39.3 % (ref 39.0–52.0)
Hemoglobin: 13.7 g/dL (ref 13.0–17.0)
MCH: 32.5 pg (ref 26.0–34.0)
MCHC: 34.9 g/dL (ref 30.0–36.0)
MCV: 93.3 fL (ref 80.0–100.0)
Platelets: 114 10*3/uL — ABNORMAL LOW (ref 150–400)
RBC: 4.21 MIL/uL — ABNORMAL LOW (ref 4.22–5.81)
RDW: 14.2 % (ref 11.5–15.5)
WBC: 5.4 10*3/uL (ref 4.0–10.5)
nRBC: 0 % (ref 0.0–0.2)

## 2023-06-10 LAB — PROTIME-INR
INR: 1.3 — ABNORMAL HIGH (ref 0.8–1.2)
Prothrombin Time: 16 s — ABNORMAL HIGH (ref 11.4–15.2)

## 2023-06-10 MED ORDER — ASPIRIN 81 MG PO TBEC
81.0000 mg | DELAYED_RELEASE_TABLET | Freq: Every day | ORAL | Status: DC
  Administered 2023-06-10 – 2023-06-11 (×2): 81 mg via ORAL
  Filled 2023-06-10 (×2): qty 1

## 2023-06-10 MED ORDER — CHLORHEXIDINE GLUCONATE CLOTH 2 % EX PADS
6.0000 | MEDICATED_PAD | Freq: Every day | CUTANEOUS | Status: DC
Start: 2023-06-10 — End: 2023-06-11
  Administered 2023-06-10: 6 via TOPICAL

## 2023-06-10 MED ORDER — FUROSEMIDE 40 MG PO TABS
40.0000 mg | ORAL_TABLET | Freq: Every day | ORAL | Status: DC
  Administered 2023-06-10: 20 mg via ORAL
  Administered 2023-06-11: 40 mg via ORAL
  Filled 2023-06-10 (×2): qty 1

## 2023-06-10 MED ORDER — CALCIUM CARBONATE ANTACID 500 MG PO CHEW
1.0000 | CHEWABLE_TABLET | ORAL | Status: DC | PRN
  Filled 2023-06-10: qty 1

## 2023-06-10 MED ORDER — FINASTERIDE 5 MG PO TABS
5.0000 mg | ORAL_TABLET | Freq: Every day | ORAL | Status: DC
  Administered 2023-06-10 – 2023-06-11 (×2): 5 mg via ORAL
  Filled 2023-06-10 (×2): qty 1

## 2023-06-10 MED ORDER — METOPROLOL TARTRATE 25 MG PO TABS
12.5000 mg | ORAL_TABLET | Freq: Two times a day (BID) | ORAL | Status: DC
Start: 2023-06-10 — End: 2023-06-11
  Administered 2023-06-10 – 2023-06-11 (×2): 12.5 mg via ORAL
  Filled 2023-06-10 (×3): qty 1

## 2023-06-10 MED ORDER — POTASSIUM CHLORIDE CRYS ER 20 MEQ PO TBCR
30.0000 meq | EXTENDED_RELEASE_TABLET | ORAL | Status: AC
  Administered 2023-06-10 (×2): 30 meq via ORAL
  Filled 2023-06-10 (×2): qty 1

## 2023-06-10 NOTE — Plan of Care (Signed)
  Problem: Acute Rehab OT Goals (only OT should resolve) Goal: Pt. Will Perform Grooming Flowsheets (Taken 06/10/2023 1527) Pt Will Perform Grooming: Independently Goal: Pt. Will Transfer To Toilet Flowsheets (Taken 06/10/2023 1527) Pt Will Transfer to Toilet: Independently Goal: Pt. Will Perform Toileting-Clothing Manipulation Flowsheets (Taken 06/10/2023 1527) Pt Will Perform Toileting - Clothing Manipulation and hygiene: Independently Goal: Pt/Caregiver Will Perform Home Exercise Program Flowsheets (Taken 06/10/2023 1527) Pt/caregiver will Perform Home Exercise Program:  Increased strength  Independently  Artez Regis OT, MOT

## 2023-06-10 NOTE — Plan of Care (Signed)
  Problem: Acute Rehab PT Goals(only PT should resolve) Goal: Pt Will Go Supine/Side To Sit Outcome: Progressing Flowsheets (Taken 06/10/2023 1551) Pt will go Supine/Side to Sit:  with modified independence  Independently Goal: Patient Will Transfer Sit To/From Stand Outcome: Progressing Flowsheets (Taken 06/10/2023 1551) Patient will transfer sit to/from stand: with modified independence Goal: Pt Will Transfer Bed To Chair/Chair To Bed Outcome: Progressing Flowsheets (Taken 06/10/2023 1551) Pt will Transfer Bed to Chair/Chair to Bed: with modified independence Goal: Pt Will Ambulate Outcome: Progressing Flowsheets (Taken 06/10/2023 1551) Pt will Ambulate:  100 feet  with modified independence  with supervision  with rolling walker  with least restrictive assistive device   3:51 PM, 06/10/23 Ocie Bob, MPT Physical Therapist with [facility] [phone] office [phone] mobile phone

## 2023-06-10 NOTE — Evaluation (Addendum)
Occupational Therapy Evaluation Patient Details Name: Angel [name] MRN: [id number] DOB: 06-Mar-1941 Today's Date: 06/10/2023   History of Present Illness Angel [name] is a 83 y.o. male with medical history significant for HTN, HLD, MI/CAD, pulmonary hypertension, CKD 3A, chronic hypoxic respiratory failure on 2 to 3 L , kidney stones, chronic diastolic HF, GERD, renal mass and bladder calculi who presented to the ED for evaluation of abdominal pain and thigh pain. Patient recently had a 1 week hospitalization about 3 weeks ago for CHF exacerbation, he was diuresed 8 L and discharged home on Lasix with instructions to follow-up with cardiology and pulmonology. He presents today with pain in his upper thigh and left abdomen. He also reports mild lower extremity swelling. He reports taking his Lasix since discharge from the hospital but not consistently. He also endorsed decrease p.o. intake and urine output as well as generalized weakness over the last few days but denies any fevers, chills, chest pain, dizziness or worsening shortness of breath. (per MD)   Clinical Impression   Pt agreeable to OT and PT co-evaluation. Pt is independent at baseline and living alone. Today pt required CGA to supervision for ambulation and transfers. RW used near the end of session due to pt's fatigue. Pt O2 levels fluctuated and did not seem accurate based on the pt's presentation. Pt is able to complete seated ADL's well without physical assist. Pt was left in the chair with call bell within reach and social worker present. Pt will benefit from continued OT in the hospital and recommended venue below to increase strength, balance, and endurance for safe ADL's.          If plan is discharge home, recommend the following: A little help with walking and/or transfers;A little help with bathing/dressing/bathroom;Assistance with cooking/housework;Assist for transportation;Help with stairs or ramp for entrance     Functional Status Assessment  Patient has had a recent decline in their functional status and demonstrates the ability to make significant improvements in function in a reasonable and predictable amount of time.  Equipment Recommendations  None recommended by OT           Precautions / Restrictions Precautions Precautions: Fall Restrictions Weight Bearing Restrictions Per Provider Order: No      Mobility Bed Mobility Overal bed mobility: Modified Independent             General bed mobility comments: labored    Transfers Overall transfer level: Needs assistance Equipment used: Rolling walker (2 wheels) Transfers: Sit to/from Stand, Bed to chair/wheelchair/BSC Sit to Stand: Supervision, Contact guard assist     Step pivot transfers: Supervision, Contact guard assist     General transfer comment: mildly unsteady for step pivot transferl; labored movement      Balance Overall balance assessment: Needs assistance Sitting-balance support: Feet supported, No upper extremity supported Sitting balance-Leahy Scale: Good Sitting balance - Comments: seated at EOB   Standing balance support: During functional activity, No upper extremity supported Standing balance-Leahy Scale: Poor Standing balance comment: poor to fair without RW                           ADL either performed or assessed with clinical judgement   ADL Overall ADL's : Needs assistance/impaired     Grooming: Supervision/safety;Standing       Lower Body Bathing: Set up;Sitting/lateral leans;Supervison/ safety       Lower Body Dressing: Supervision/safety;Set up;Sitting/lateral leans   Toilet Transfer:  Supervision/safety;Rolling walker (2 wheels);Ambulation;Contact guard assist Toilet Transfer Details (indicate cue type and reason): Simualted via chair to EOB without RW and ambualtion in the hall with RW.         Functional mobility during ADLs: Supervision/safety;Rolling walker (2  wheels) General ADL Comments: Able to ambulate over 100 feet in the hall without RW initially but then using the RW towards the end of the walk.     Vision Baseline Vision/History: 4 Cataracts Ability to See in Adequate Light: 1 Impaired Patient Visual Report: No change from baseline Vision Assessment?: No apparent visual deficits     Perception Perception: Not tested       Praxis Praxis: Not tested       Pertinent Vitals/Pain Pain Assessment Pain Assessment: No/denies pain     Extremity/Trunk Assessment Upper Extremity Assessment Upper Extremity Assessment: Generalized weakness   Lower Extremity Assessment Lower Extremity Assessment: Defer to PT evaluation   Cervical / Trunk Assessment Cervical / Trunk Assessment: Kyphotic   Communication Communication Communication: No apparent difficulties   Cognition Arousal: Alert Behavior During Therapy: WFL for tasks assessed/performed Overall Cognitive Status: Within Functional Limits for tasks assessed                                                        Home Living Family/patient expects to be discharged to:: Private residence Living Arrangements: Alone Available Help at Discharge: Family;Neighbor;Available PRN/intermittently Type of Home: House Home Access: Stairs to enter [employer] of Steps: 5 Entrance Stairs-Rails: Can reach both Home Layout: One level     Bathroom Shower/Tub: Producer, television/film/video: Standard     Home Equipment: Cane - single point;shower seat   Additional Comments: Pt reporting he does not have a walker now.      Prior Functioning/Environment Prior Level of Function : Independent/Modified Independent;History of Falls (last six months)             Mobility Comments: No AD with ambulation ADLs Comments: independent        OT Problem List: Decreased strength;Decreased activity tolerance;Impaired balance (sitting and/or standing)       OT Treatment/Interventions: Self-care/ADL training;Therapeutic exercise;Therapeutic activities;Patient/family education    OT Goals(Current goals can be found in the care plan section) Acute Rehab OT Goals Patient Stated Goal: return home OT Goal Formulation: With patient Time For Goal Achievement: 06/24/23 Potential to Achieve Goals: Good  OT Frequency: Min 1X/week    Co-evaluation PT/OT/SLP Co-Evaluation/Treatment: Yes Reason for Co-Treatment: To address functional/ADL transfers   OT goals addressed during session: ADL's and self-care                       End of Session Equipment Utilized During Treatment: Oxygen;Rolling walker (2 wheels)  Activity Tolerance: Patient tolerated treatment well Patient left: in chair;with call bell/phone within reach  OT Visit Diagnosis: Unsteadiness on feet (R26.81);Other abnormalities of gait and mobility (R26.89);Muscle weakness (generalized) (M62.81)                Time: 1610-9604 OT Time Calculation (min): 24 min Charges:  OT General Charges $OT Visit: 1 Visit OT Evaluation $OT Eval Low Complexity: 1 Low  Vasti Yagi OT, MOT   Danie Chandler 06/10/2023, 3:22 PM

## 2023-06-10 NOTE — Evaluation (Signed)
Clinical/Bedside Swallow Evaluation Patient Details  Name: Angel [name] MRN: [id number] Date of Birth: [birth year]  Today's Date: 06/10/2023 Time: SLP Start Time (ACUTE ONLY): 1823 SLP Stop Time (ACUTE ONLY): 1844 SLP Time Calculation (min) (ACUTE ONLY): 21 min  Past Medical History:  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   Past Surgical History:  Past Surgical History:  Procedure Laterality Date   COLONOSCOPY     COLONOSCOPY     CORONARY ANGIOPLASTY  2010   Dr Eden Emms   CYSTOSCOPY WITH LITHOLAPAXY N/A 02/24/2017   Procedure: CYSTOSCOPY WITH LITHOLAPAXY;  Surgeon: Malen Gauze, MD;  Location: [facility];  Service: Urology;  Laterality: N/A;   CYSTOSCOPY WITH URETHRAL DILATATION  02/24/2017   Procedure: CYSTOSCOPY WITH URETHRAL DILATATION;  Surgeon: Malen Gauze, MD;  Location: [facility];  Service: Urology;;   DOPPLER ECHOCARDIOGRAPHY     EAR CYST EXCISION Left 07/23/2012   Procedure: EXCISION LEFT EAR LOBE;  Surgeon: Suzanna Obey, MD;  Location[facility];  Service: ENT;  Laterality: Left;   HOLMIUM LASER APPLICATION N/A 02/24/2017   Procedure: HOLMIUM LASER APPLICATION OF BLADDER CALCULI;  Surgeon: Malen Gauze, MD;  Location: [facility];  Service: Urology;  Laterality: N/A;   IR RADIOLOGIST EVAL & MGMT  11/28/2016   HPI:  [name] is a 83 y.o. male with past medical history significant for HTN, HLD, MI/CAD, pulmonary hypertension, CKD 3A, chronic  hypoxic respiratory failure on 2 - 3L [place], kidney stones, chronic diastolic CHF, GERD, renal mass and bladder calculi who presented to the [facility] ED with complaint of abdominal pain and thigh pain. Patient recently had a 1 week hospitalization about 3 weeks ago for CHF exacerbation, he was diuresed 8 L and discharged home on Lasix with instructions to follow-up with cardiology and pulmonology. He presents today with pain in his upper thigh and left abdomen. He also reports mild lower extremity swelling. He reports taking his Lasix since discharge from the hospital but not consistently. He also endorsed decrease p.o. intake and urine output as well as generalized weakness over the last few days but denies any fevers, chills, chest pain, dizziness or worsening shortness of breath.    Assessment / Plan / Recommendation  Clinical Impression  Clinical swallow evaluation completed with Pt seated up in chair. He reports that he typically does not have issues with swallowing, but is currently experiencing significant reflux. He attributes this to taking a potassium pill before lunch and has been unsettled since. Oral motor exam is WNL. Pt is edentulous, has dentures but no longer wears. He was agreeable to small amount of PO intake "to see if it will stay down". Pt without overt signs or symptoms of aspiration and no reports of globus when  presented with thin water via straw sips and peanut butter cookies. He began belching and then hiccupping, but then subsided. He then requested strawberry ice cream and SLP provided. Pt denies h/o needing esophageal dilation and says he typically does not have much trouble with reflux. Recommend continue regular textures and thin liquids via cup/straw and medications whole with water with adherence to esophageal precautions (sit upright for all PO and remain upright for 30+ minutes after) and standard aspiration precautions. No further SLP services required at this time.   SLP  Visit Diagnosis: Dysphagia, unspecified (R13.10)    Aspiration Risk  Mild aspiration risk    Diet Recommendation Regular;Thin liquid    Liquid Administration via: Straw;Cup Medication Administration: Whole meds with liquid Supervision: Patient able to self feed Postural Changes: Seated upright at 90 degrees;Remain upright for at least 30 minutes after po intake    Other  Recommendations Oral Care Recommendations: Oral care BID    Recommendations for follow up therapy are one component of a multi-disciplinary discharge planning process, led by the attending physician.  Recommendations may be updated based on patient status, additional functional criteria and insurance authorization.  Follow up Recommendations No SLP follow up      Assistance Recommended at Discharge    Functional Status Assessment Patient has not had a recent decline in their functional status  Frequency and Duration            Prognosis        Swallow Study   General Date of Onset: 06/09/23 HPI: [name] is a 83 y.o. male with past medical history significant for HTN, HLD, MI/CAD, pulmonary hypertension, CKD 3A, chronic hypoxic respiratory failure on 2 - 3L [address], kidney stones, chronic diastolic CHF, GERD, renal mass and bladder calculi who presented to the [facility] ED with complaint of abdominal pain and thigh pain. Patient recently had a 1 week hospitalization about 3 weeks ago for CHF exacerbation, he was diuresed 8 L and discharged home on Lasix with instructions to follow-up with cardiology and pulmonology. He presents today with pain in his upper thigh and left abdomen. He also reports mild lower extremity swelling. He reports taking his Lasix since discharge from the hospital but not consistently. He also endorsed decrease p.o. intake and urine output as well as generalized weakness over the last few days but denies any fevers, chills, chest pain, dizziness or worsening shortness of breath. Type of  Study: Bedside Swallow Evaluation Diet Prior to this Study: Regular;Thin liquids (Level 0) Temperature Spikes Noted: No Respiratory Status: Nasal cannula History of Recent Intubation: No Behavior/Cognition: Alert;Cooperative;Pleasant mood Oral Cavity Assessment: Within Functional Limits Oral Care Completed by SLP: Recent completion by staff Oral Cavity - Dentition: Edentulous Vision: Functional for self-feeding Self-Feeding Abilities: Able to feed self Patient Positioning: Upright in chair Baseline Vocal Quality: Normal Volitional Cough: Strong Volitional Swallow: Able to elicit    Oral/Motor/Sensory Function Overall Oral Motor/Sensory Function: Within functional limits   Ice Chips Ice chips: Within functional limits Presentation: Spoon   Thin Liquid Thin Liquid: Within functional limits Presentation: Self Fed;Straw    Nectar Thick Nectar Thick Liquid: Not tested   Honey Thick Honey Thick Liquid: Not tested   Puree Puree: Not tested   Solid     Solid: Within functional limits Presentation: Self Fed     Thank you,  Havery Moros, CCC-SLP [phone]  Natasha Burda 06/10/2023,7:00 PM

## 2023-06-10 NOTE — Progress Notes (Signed)
PROGRESS NOTE    Angel [name]  RUE:[id number] DOB: Dec 23, 1940 DOA: 06/09/2023 PCP: Tracey Harries, MD    Brief Narrative:   Angel [name] is a 83 y.o. male with past medical history significant for HTN, HLD, MI/CAD, pulmonary hypertension, CKD 3A, chronic hypoxic respiratory failure on 2 - 3L [place], kidney stones, chronic diastolic CHF, GERD, renal mass and bladder calculi who presented to the [facility] ED with complaint of abdominal pain and thigh pain. Patient recently had a 1 week hospitalization about 3 weeks ago for CHF exacerbation, he was diuresed 8 L and discharged home on Lasix with instructions to follow-up with cardiology and pulmonology. He presents today with pain in his upper thigh and left abdomen. He also reports mild lower extremity swelling. He reports taking his Lasix since discharge from the hospital but not consistently. He also endorsed decrease p.o. intake and urine output as well as generalized weakness over the last few days but denies any fevers, chills, chest pain, dizziness or worsening shortness of breath.   In the ED, temperature 97.6 F, HR 61, RR 14, BP 107 83, SpO2 99% on 2 L nasal cannula.  On baseline 2 L [place]. Labs significant for sodium 129, BUN/creatinine 67/4.31, BNP 1485, UA with large hemoglobinuria but no signs of infection.  CT chest/abdomen/pelvis shows stable small bilateral pleural effusions, stable small pericardial effusion, similar 12 mm LUL spiculated nodule, similar 8.9 cm right renal mass, evidence of cirrhosis and small ascites as well as mild bilateral hydroureteronephrosis and distended bladder likely due to obstructive uropathy from enlarged prostate. RLE U/S DVT was negative for DVT.   Patient was given IV LR 500 cc bolus.  Foley catheter was placed. TRH was consulted for admission  Assessment & Plan:   Acute renal failure CT on CKD stage IIIa Obstructive uropathy Patient presented with lower abdominal pain found to have significant rise in his  creatinine from baseline of 1.2-1.3 to 4.31. Patient found to have distended bladder and hydroureteronephrosis on imaging; likely cause secondary to obstructive uropathy. -- Continue Foley catheter -- Tamsulosin 0.4 mg p.o. daily -- Start finasteride 5 g p.o. daily -- Outpatient follow-up with urology for voiding trial -- BMP daily  Hypokalemia Potassium 3.4, will replete. -- Repeat electrolytes in a.m. to include magnesium   Renal mass Currently being followed by patient's urologist. Imaging shows stable 8.9 cm heterogeneous right renal mass.  -- Continue Foley catheter as above -- Outpatient follow-up with urology, Dr. Ronne Binning  Hx pulmonary hypertension Chronic diastolic congestive heart failure, compensated Chronic hypoxic respiratory failure on 2-3 L Patient remains on his baseline 2-3 L . He is in no respiratory distress. Patient not significantly fluid overloaded on exam.  -- Furosemide 40 mg p.o. daily -- Metoprolol 12.5 g p.o. twice daily -- Strict I&O's, daily weight   Cirrhosis Thrombocytopenia CT scan shows nodular hepatic contour compatible with cirrhosis and small volume abdominal pelvic ascites. Normal LFTs. Albumin 3.1. Platelet low at 122 but improved from 92 two weeks ago. -- Outpatient follow-up with gastroenterology   Hx MI/CAD -- Continue aspirin 81 mg p.o. daily -- Has refused statin therapy   Hx of lung mass CT scan shows stable 12 mm spiculated nodule in the LUL concerning for malignancy with additional subcentimeter nodules -- Follow-up with pulmonology in the outpatient  Weakness/debility/deconditioning: -- PT/OT following; may need SNF versus home health on discharge   DVT prophylaxis: enoxaparin (LOVENOX) injection 30 mg Start: 06/09/23 2200    Code Status: Full  Code Family Communication: No family present at bedside this morning  Disposition Plan:  Level of care: Telemetry Status is: Inpatient Remains inpatient appropriate because:  Further monitoring of renal function, may need SNF placement versus home health    Consultants:  None  Procedures:  Foley catheter placement  Antimicrobials:  None   Subjective: Patient seen examined at bedside, resting calmly.  Lying in bed.  No specific complaints this morning.  Denies headache, no dizziness, no chest pain, no palpitations, no shortness of breath, no abdominal pain, no fever/chills, no nausea/vomiting/diarrhea.  No acute events overnight per nursing staff.  Objective: Vitals:   06/10/23 0027 06/10/23 0029 06/10/23 0558 06/10/23 0810  BP:  130/67 120/69 (!) 94/54  Pulse:  70  83  Resp:  16    Temp:  97.8 F (36.6 C) 97.9 F (36.6 C)   TempSrc:  Axillary Oral   SpO2:   95% 92%  Weight: 67.9 kg 67.9 kg    Height:  5\' 10"  (1.778 m)      Intake/Output Summary (Last 24 hours) at 06/10/2023 1601 Last data filed at 06/10/2023 0500 Gross per 24 hour  Intake --  Output 3700 ml  Net -3700 ml   Filed Weights   06/10/23 0027 06/10/23 0029  Weight: 67.9 kg 67.9 kg    Examination:  Physical Exam: GEN: NAD, alert and oriented x 3, elderly/chronically ill appearance HEENT: NCAT, PERRL, EOMI, sclera clear, MMM PULM: CTAB w/o wheezes/crackles, normal respiratory effort, on nasal cannula which is his baseline CV: RRR w/o M/G/R GI: abd soft, NTND, NABS, no R/G/M GU: Foley catheter noted with blood-tinged urine noted in collection bag MSK: no peripheral edema, moves all extremities independently NEURO: No focal neurological deficit PSYCH: normal mood/affect Integumentary: No concerning rashes/lesions/wounds noted on exposed skin surfaces    Data Reviewed: I have personally reviewed following labs and imaging studies  CBC: Recent Labs  Lab 06/09/23 1446 06/10/23 0434  WBC 8.3 5.4  NEUTROABS 6.8  --   HGB 15.6 13.7  HCT 45.2 39.3  MCV 97.2 93.3  PLT 122* 114*   Basic Metabolic Panel: Recent Labs  Lab 06/09/23 1446 06/10/23 0434  NA 129* 136  K  3.7 3.4*  CL 94* 101  CO2 23 21*  GLUCOSE 85 70  BUN 67* 62*  CREATININE 4.31* 3.86*  CALCIUM 8.3* 8.4*  MG  --  1.9   GFR: Estimated Creatinine Clearance: 13.9 mL/min (A) (by C-G formula based on SCr of 3.86 mg/dL (H)). Liver Function Tests: Recent Labs  Lab 06/09/23 1446 06/10/23 0434  AST 10* 7*  ALT 7 5  ALKPHOS 61 53  BILITOT 1.7* 1.4*  PROT 6.1* 5.4*  ALBUMIN 3.1* 2.8*   No results for input(s): "LIPASE", "AMYLASE" in the last 168 hours. No results for input(s): "AMMONIA" in the last 168 hours. Coagulation Profile: Recent Labs  Lab 06/10/23 0434  INR 1.3*   Cardiac Enzymes: Recent Labs  Lab 06/09/23 1446  CKTOTAL 42*   BNP (last 3 results) No results for input(s): "PROBNP" in the last 8760 hours. HbA1C: No results for input(s): "HGBA1C" in the last 72 hours. CBG: No results for input(s): "GLUCAP" in the last 168 hours. Lipid Profile: No results for input(s): "CHOL", "HDL", "LDLCALC", "TRIG", "CHOLHDL", "LDLDIRECT" in the last 72 hours. Thyroid Function Tests: No results for input(s): "TSH", "T4TOTAL", "FREET4", "T3FREE", "THYROIDAB" in the last 72 hours. Anemia Panel: No results for input(s): "VITAMINB12", "FOLATE", "FERRITIN", "TIBC", "IRON", "RETICCTPCT" in the last 72  hours. Sepsis Labs: No results for input(s): "PROCALCITON", "LATICACIDVEN" in the last 168 hours.  No results found for this or any previous visit (from the past 240 hours).       Radiology Studies: DG HIP UNILAT WITH PELVIS 2-3 VIEWS LEFT Result Date: 06/10/2023 CLINICAL DATA:  Left hip pain for 2 weeks EXAM: DG HIP (WITH OR WITHOUT PELVIS) 2-3V LEFT COMPARISON:  CT 06/09/2023 FINDINGS: Bones are demineralized. There is no evidence of hip fracture or dislocation. 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 No lytic or sclerotic bone lesion. 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 No acute findings. Electronically Signed   By: Duanne Guess D.O.   On:  06/10/2023 14:00   CT CHEST ABDOMEN PELVIS WO CONTRAST Result Date: 06/09/2023 CLINICAL DATA:  Unintended weight loss and left-sided abdominal pain. Known kidney cancer on the right side. EXAM: CT CHEST, ABDOMEN AND PELVIS WITHOUT CONTRAST TECHNIQUE: Multidetector CT imaging of the chest, abdomen and pelvis was performed following the standard protocol without IV contrast. RADIATION DOSE REDUCTION: This exam was performed according to the departmental dose-optimization program which includes automated exposure control, adjustment of the mA and/or kV according to patient size and/or use of iterative reconstruction technique. COMPARISON:  Renal ultrasound 05/27/2023; CTA chest 05/25/2023; MRI abdomen 07/30/2021; CT chest abdomen and pelvis 04/04/2020 FINDINGS: CT CHEST FINDINGS Cardiovascular: Similar small pericardial effusion. Coronary artery and aortic atherosclerotic calcification. Mediastinum/Nodes: Trachea and esophagus are unremarkable. No thoracic adenopathy noting decreased sensitivity on noncontrast exam. Lungs/Pleura: Small bilateral pleural effusions similar to 05/25/2023. Similar 12 mm spiculated nodule in the left upper lobe (2/14). Numerous additional subcentimeter nodules are also unchanged. Biapical pleural-parenchymal scarring. No pneumothorax. Musculoskeletal: No acute fracture or destructive osseous lesion. CT ABDOMEN PELVIS FINDINGS Hepatobiliary: Nodular hepatic contour compatible with cirrhosis. Cholelithiasis. No biliary dilation. Pancreas: Unremarkable. Spleen: Unremarkable. Adrenals/Urinary Tract: Stable adrenal glands. 8.9 x 7.4 cm heterogenous right renal mass is grossly similar in size to ultrasound 05/27/2023 given differences in technique. No renal or ureteral calculi. Mild bilateral hydroureteronephrosis. Bladder wall calcifications or layering stones in the right posterior bladder. The bladder is distended. Stomach/Bowel: Normal caliber large and small bowel. Colonic diverticulosis  without diverticulitis. Stomach is within normal limits. Vascular/Lymphatic: 3.2 cm infrarenal abdominal aortic aneurysm. Aortic atherosclerotic calcification. No lymphadenopathy. Reproductive: Enlarged prostate. Other: Small volume abdominopelvic ascites. Mesenteric edema and vascular engorgement in the central mesentery. Musculoskeletal: No acute fracture or destructive osseous lesion. IMPRESSION: 1. Similar small bilateral pleural effusions 2. Similar small pericardial effusion. 3. Similar 12 mm spiculated nodule in the left upper lobe concerning for malignancy. Numerous additional subcentimeter nodules are also unchanged. 4. 8.9 cm heterogenous right renal mass is grossly similar in size to ultrasound 05/27/2023 given differences in technique. 5. Mild bilateral hydroureteronephrosis and distended bladder likely due to obstructive uropathy from enlarged prostate. 6. Nodular hepatic contour compatible with cirrhosis and small volume abdominopelvic ascites. 7. 3.2 cm infrarenal abdominal aortic aneurysm. Recommend follow-up ultrasound every 3 years. Aortic Atherosclerosis (ICD10-I70.0). Electronically Signed   By: Minerva Fester M.D.   On: 06/09/2023 17:02   US Venous Img Lower Unilateral Right Result Date: 06/09/2023 CLINICAL DATA:  edema EXAM: RIGHT LOWER EXTREMITY VENOUS DOPPLER ULTRASOUND TECHNIQUE: Gray-scale sonography with compression, as well as color and duplex ultrasound, were performed to evaluate the deep venous system(s) from the level of the common femoral vein through the popliteal and proximal calf veins. COMPARISON:  Lower extremity venous duplex, 05/25/2023. FINDINGS: VENOUS Normal compressibility of the common femoral, superficial femoral, and  popliteal veins, as well as the visualized calf veins. Visualized portions of profunda femoral vein and great saphenous vein unremarkable. No filling defects to suggest DVT on grayscale or color Doppler imaging. Doppler waveforms show normal direction  of venous flow, normal respiratory plasticity and response to augmentation. Limited views of the contralateral common femoral vein are unremarkable. OTHER No evidence of superficial thrombophlebitis or abnormal fluid collection. Limitations: none IMPRESSION: No evidence of femoropopliteal DVT or superficial thrombophlebitis within the RIGHT lower extremity. Roanna Banning, MD [facility] Radiology Electronically Signed   By: Roanna Banning M.D.   On: 06/09/2023 16:07        Scheduled Meds:  aspirin EC  81 mg Oral Daily   Chlorhexidine Gluconate Cloth  6 each Topical Daily   enoxaparin (LOVENOX) injection  30 mg Subcutaneous Q24H   finasteride  5 mg Oral Daily   furosemide  40 mg Oral Daily   metoprolol tartrate  12.5 mg Oral BID   tamsulosin  0.4 mg Oral Daily   Continuous Infusions:   LOS: 1 day    Time spent: 52 minutes spent on chart review, discussion with nursing staff, consultants, updating family and interview/physical exam; more than 50% of that time was spent in counseling and/or coordination of care.    Alvira Philips [place], DO [facility] Available via Epic secure chat 7am-7pm After these hours, please refer to coverage provider listed on [email or web address] 06/10/2023, 4:01 PM

## 2023-06-10 NOTE — Evaluation (Signed)
Physical Therapy Evaluation Patient Details Name: Angel [name] MRN: [id number] DOB: 1940/08/27 Today's Date: 06/10/2023  History of Present Illness  Angel [name] is a 83 y.o. male with medical history significant for HTN, HLD, MI/CAD, pulmonary hypertension, CKD 3A, chronic hypoxic respiratory failure on 2 to 3 L [place], kidney stones, chronic diastolic HF, GERD, renal mass and bladder calculi who presented to the ED for evaluation of abdominal pain and thigh pain. Patient recently had a 1 week hospitalization about 3 weeks ago for CHF exacerbation, he was diuresed 8 L and discharged home on Lasix with instructions to follow-up with cardiology and pulmonology. He presents today with pain in his upper thigh and left abdomen. He also reports mild lower extremity swelling. He reports taking his Lasix since discharge from the hospital but not consistently. He also endorsed decrease p.o. intake and urine output as well as generalized weakness over the last few days but denies any fevers, chills, chest pain, dizziness or worsening shortness of breath.   Clinical Impression  Patient demonstrates good return for bed mobility and transfers, slightly labored movement during gait training, fatigues easily without AD, required use of RW for returning to room and limited mostly due to fatigue.  Patient tolerated staying up in chair after therapy.  Patient will benefit from continued skilled physical therapy in hospital and recommended venue below to increase strength, balance, endurance for safe ADLs and gait.          If plan is discharge home, recommend the following: A little help with walking and/or transfers;A little help with bathing/dressing/bathroom;Help with stairs or ramp for entrance;Assistance with cooking/housework   Can travel by private vehicle   Yes    Equipment Recommendations Rolling walker (2 wheels)  Recommendations for Other Services       Functional Status Assessment Patient has had a  recent decline in their functional status and demonstrates the ability to make significant improvements in function in a reasonable and predictable amount of time.     Precautions / Restrictions Precautions Precautions: Fall Restrictions Weight Bearing Restrictions Per Provider Order: No      Mobility  Bed Mobility Overal bed mobility: Modified Independent                  Transfers Overall transfer level: Needs assistance Equipment used: Rolling walker (2 wheels) Transfers: Sit to/from Stand, Bed to chair/wheelchair/BSC Sit to Stand: Supervision, Contact guard assist   Step pivot transfers: Supervision, Contact guard assist       General transfer comment: slightly labored movement, increased time    Ambulation/Gait Ambulation/Gait assistance: Supervision, Contact guard assist Gait Distance (Feet): 50 Feet Assistive device: Rolling walker (2 wheels) Gait Pattern/deviations: Decreased step length - left, Decreased stance time - right, Decreased stride length, Trunk flexed Gait velocity: decreased     General Gait Details: able to ambulate without AD, but easily fatigued and requested use of RW for returning to room without loss of balance  Stairs            Wheelchair Mobility     Tilt Bed    Modified Rankin (Stroke Patients Only)       Balance Overall balance assessment: Needs assistance Sitting-balance support: Feet supported, No upper extremity supported Sitting balance-Leahy Scale: Good Sitting balance - Comments: seated at EOB   Standing balance support: During functional activity, No upper extremity supported Standing balance-Leahy Scale: Fair Standing balance comment: fair/good using RW  Pertinent Vitals/Pain Pain Assessment Pain Assessment: No/denies pain    Home Living Family/patient expects to be discharged to:: Private residence Living Arrangements: Alone Available Help at Discharge:  Family;Neighbor;Available PRN/intermittently Type of Home: House Home Access: Stairs to enter Entrance Stairs-Rails: Can reach both Entrance Stairs-Number of Steps: 5   Home Layout: One level Home Equipment: Cane - single point;Shower seat Additional Comments: Pt reporting he does not have a walker now.    Prior Function Prior Level of Function : Independent/Modified Independent;History of Falls (last six months)             Mobility Comments: No AD with ambulation ADLs Comments: independent     Extremity/Trunk Assessment   Upper Extremity Assessment Upper Extremity Assessment: Defer to OT evaluation    Lower Extremity Assessment Lower Extremity Assessment: Generalized weakness    Cervical / Trunk Assessment Cervical / Trunk Assessment: Kyphotic  Communication   Communication Communication: No apparent difficulties  Cognition Arousal: Alert Behavior During Therapy: WFL for tasks assessed/performed Overall Cognitive Status: Within Functional Limits for tasks assessed                                          General Comments      Exercises     Assessment/Plan    PT Assessment Patient needs continued PT services  PT Problem List Decreased strength;Decreased activity tolerance;Decreased balance;Decreased mobility;Decreased coordination       PT Treatment Interventions DME instruction;Gait training;Functional mobility training;Stair training;Therapeutic activities;Therapeutic exercise;Balance training;Patient/family education    PT Goals (Current goals can be found in the Care Plan section)  Acute Rehab PT Goals Patient Stated Goal: return home PT Goal Formulation: With patient Time For Goal Achievement: 06/24/23 Potential to Achieve Goals: Good    Frequency Min 3X/week     Co-evaluation PT/OT/SLP Co-Evaluation/Treatment: Yes Reason for Co-Treatment: To address functional/ADL transfers PT goals addressed during session:  Mobility/safety with mobility;Balance;Proper use of DME OT goals addressed during session: ADL's and self-care       AM-PAC PT "6 Clicks" Mobility  Outcome Measure Help needed turning from your back to your side while in a flat bed without using bedrails?: None Help needed moving from lying on your back to sitting on the side of a flat bed without using bedrails?: None Help needed moving to and from a bed to a chair (including a wheelchair)?: A Little Help needed standing up from a chair using your arms (e.g., wheelchair or bedside chair)?: A Little Help needed to walk in hospital room?: A Little Help needed climbing 3-5 steps with a railing? : A Lot 6 Click Score: 19    End of Session Equipment Utilized During Treatment: Oxygen Activity Tolerance: Patient tolerated treatment well;Patient limited by fatigue Patient left: with call bell/phone within reach;in chair Nurse Communication: Mobility status PT Visit Diagnosis: Unsteadiness on feet (R26.81);Muscle weakness (generalized) (M62.81)    Time: 1610-9604 PT Time Calculation (min) (ACUTE ONLY): 30 min   Charges:   PT Evaluation $PT Eval Moderate Complexity: 1 Mod PT Treatments $Therapeutic Activity: 23-37 mins PT General Charges $$ ACUTE PT VISIT: 1 Visit         3:50 PM, 06/10/23 Ocie Bob, MPT Physical Therapist with [facility] [phone] office [phone] mobile phone

## 2023-06-10 NOTE — TOC Initial Note (Signed)
Transition of Care [facility]) - Initial/Assessment Note    Patient Details  Name: Angel [name] MRN: [id number] Date of Birth: 27-Nov-1940  Transition of Care [facility]) CM/SW Contact:    Elliot Gault, LCSW Phone Number: 06/10/2023, 4:03 PM  Clinical Narrative:                  Pt admitted from home. He has a high readmission risk score. Pt known to TOC from recent admission.  Met with pt at bedside to assess and review dc planning. Pt states that he has been doing well at home and still has [facility] PT/[facility] from [facility]. PT says that pt's family was in the room when they arrived to work with pt and they suggested that pt needs SNF.   Discussed with pt who states that he walked quite a ways in the hallway here and he does not think he needs SNF. Pt states he wants to return home with [facility]. Discussed with him that his family wants him to go to rehab and pt states he understands that but that he does not agree. Pt aware that TOC can refer out for SNF if he changes his mind.  Updated MD. Will follow.  Expected Discharge Plan: Home w Home Health Services Barriers to Discharge: Continued Medical Work up   Patient Goals and CMS Choice Patient states their goals for this hospitalization and ongoing recovery are:: return home CMS Medicare.gov Compare Post Acute Care list provided to:: Patient Choice offered to / list presented to : Patient      Expected Discharge Plan and Services In-house Referral: Clinical Social Work   Post Acute Care Choice: Resumption of Svcs/PTA Provider Living arrangements for the past 2 months: Mobile Home                                      Prior Living Arrangements/Services Living arrangements for the past 2 months: Mobile Home Lives with:: Pets Patient language and need for interpreter reviewed:: Yes Do you feel safe going back to the place where you live?: Yes      Need for Family Participation in Patient Care: No (Comment) Care giver support system in place?:  No (comment) Current home services: Home OT, Home PT, DME Criminal Activity/Legal Involvement Pertinent to Current Situation/Hospitalization: No - Comment as needed  Activities of Daily Living   ADL Screening (condition at time of admission) Independently performs ADLs?: Yes (appropriate for developmental age) Is the patient deaf or have difficulty hearing?: No Does the patient have difficulty seeing, even when wearing glasses/contacts?: No Does the patient have difficulty concentrating, remembering, or making decisions?: No  Permission Sought/Granted                  Emotional Assessment Appearance:: Appears stated age Attitude/Demeanor/Rapport: Engaged Affect (typically observed): Pleasant Orientation: : Oriented to Self, Oriented to Place, Oriented to  Time, Oriented to Situation Alcohol / Substance Use: Not Applicable Psych Involvement: No (comment)  Admission diagnosis:  Urine retention [R33.9] AKI (acute kidney injury) (HCC) [N17.9] Acute renal failure superimposed on chronic kidney disease (HCC) [N17.9, N18.9] Patient Active Problem List   Diagnosis Date Noted   Urine retention 06/10/2023   Hydroureteronephrosis 06/10/2023   Chronic diastolic HF (heart failure) (HCC) 06/10/2023   Acute renal failure superimposed on chronic kidney disease (HCC) 06/09/2023   Acute on chronic diastolic CHF (congestive heart failure) (HCC) 05/25/2023  Elevated brain natriuretic peptide (BNP) level 05/25/2023   History of pulmonary hypertension 05/25/2023   Acute on chronic respiratory failure with hypoxia (HCC) 05/25/2023   Multiple pulmonary nodules determined by computed tomography of lung 02/11/2023   Pulmonary hypertension (HCC) 11/17/2021   Edema of both lower legs 09/25/2021   Centrilobular emphysema (HCC) 07/17/2021   DOE (dyspnea on exertion) 07/05/2021   Chronic kidney disease, stage 3a (HCC) 12/18/2020   ILD (interstitial lung disease) (HCC) 11/16/2020   Chronic  respiratory failure with hypoxia (HCC) 10/19/2020   Acute cystitis without hematuria 07/07/2019   Calculus of bladder 07/07/2019   Right kidney mass 04/05/2018   Renal cell cancer, right (HCC) 05/24/2017   BPH (benign prostatic hyperplasia) 02/24/2017   Diverticulosis of colon 10/10/2016   Tubular adenoma of colon 10/10/2016   Hernia of abdominal wall 07/04/2011   Elevated PSA 06/20/2011   GERD (gastroesophageal reflux disease) 07/10/2010   Osteoarthritis, multiple sites 04/17/2010   ABDOMINAL PAIN-GENERALIZED 02/16/2010   ESSENTIAL HYPERTENSION, BENIGN 02/02/2010   ELEVATED BLOOD PRESSURE 01/31/2010   MIXED HYPERLIPIDEMIA 01/18/2010   CORONARY ATHEROSCLEROSIS NATIVE CORONARY ARTERY 01/18/2010   FATIGUE 01/18/2010   PCP:  Tracey Harries, MD Pharmacy:   [facility] - [place], [place] - [address] [place] [address] Phone: [phone] Fax: [phone]  [facility] [phone] - [place] - [address] AT [facility] OF [address] [place] [address] Phone: [phone] Fax: [phone]     Social Drivers of Health (SDOH) Social History: SDOH Screenings   Food Insecurity: Patient Declined (06/10/2023)  Housing: Patient Declined (06/10/2023)  Transportation Needs: Patient Declined (06/10/2023)  Utilities: Patient Declined (06/10/2023)  Financial Resource Strain: Low Risk  (10/28/2022)   [facility]  Physical Activity: Unknown (10/28/2022)   Received from [facility]  Recent Concern: Physical Activity - Inactive (10/28/2022)   Received from [facility]  Social Connections: Patient Declined (06/10/2023)  Recent Concern: Social Connections - Socially Isolated (05/25/2023)  Stress: No Stress Concern Present (10/28/2022)   [facility]  Tobacco Use: Medium Risk (06/10/2023)   SDOH Interventions:     Readmission Risk  Interventions    05/28/2023   11:19 AM  Readmission Risk Prevention Plan  Post Dischage Appt Not Complete  Medication Screening Complete  Transportation Screening Complete

## 2023-06-11 DIAGNOSIS — N139 Obstructive and reflux uropathy, unspecified: Secondary | ICD-10-CM | POA: Diagnosis not present

## 2023-06-11 DIAGNOSIS — N1832 Chronic kidney disease, stage 3b: Secondary | ICD-10-CM | POA: Diagnosis not present

## 2023-06-11 DIAGNOSIS — N179 Acute kidney failure, unspecified: Secondary | ICD-10-CM | POA: Diagnosis not present

## 2023-06-11 LAB — HEPATIC FUNCTION PANEL
ALT: 6 U/L (ref 0–44)
AST: 11 U/L — ABNORMAL LOW (ref 15–41)
Albumin: 2.8 g/dL — ABNORMAL LOW (ref 3.5–5.0)
Alkaline Phosphatase: 54 U/L (ref 38–126)
Bilirubin, Direct: 0.6 mg/dL — ABNORMAL HIGH (ref 0.0–0.2)
Indirect Bilirubin: 1.1 mg/dL — ABNORMAL HIGH (ref 0.3–0.9)
Total Bilirubin: 1.7 mg/dL — ABNORMAL HIGH (ref 0.0–1.2)
Total Protein: 5.5 g/dL — ABNORMAL LOW (ref 6.5–8.1)

## 2023-06-11 LAB — BASIC METABOLIC PANEL
Anion gap: 11 (ref 5–15)
BUN: 53 mg/dL — ABNORMAL HIGH (ref 8–23)
CO2: 24 mmol/L (ref 22–32)
Calcium: 8.5 mg/dL — ABNORMAL LOW (ref 8.9–10.3)
Chloride: 105 mmol/L (ref 98–111)
Creatinine, Ser: 2.93 mg/dL — ABNORMAL HIGH (ref 0.61–1.24)
GFR, Estimated: 21 mL/min — ABNORMAL LOW (ref >60)
Glucose, Bld: 103 mg/dL — ABNORMAL HIGH (ref 70–99)
Potassium: 3.3 mmol/L — ABNORMAL LOW (ref 3.5–5.1)
Sodium: 140 mmol/L (ref 135–145)

## 2023-06-11 LAB — CBC
HCT: 40.6 % (ref 39.0–52.0)
Hemoglobin: 14 g/dL (ref 13.0–17.0)
MCH: 33.3 pg (ref 26.0–34.0)
MCHC: 34.5 g/dL (ref 30.0–36.0)
MCV: 96.4 fL (ref 80.0–100.0)
Platelets: 133 10*3/uL — ABNORMAL LOW (ref 150–400)
RBC: 4.21 MIL/uL — ABNORMAL LOW (ref 4.22–5.81)
RDW: 14.4 % (ref 11.5–15.5)
WBC: 6.1 10*3/uL (ref 4.0–10.5)
nRBC: 0 % (ref 0.0–0.2)

## 2023-06-11 LAB — MAGNESIUM: Magnesium: 1.7 mg/dL (ref 1.7–2.4)

## 2023-06-11 LAB — PHOSPHORUS: Phosphorus: 3.7 mg/dL (ref 2.5–4.6)

## 2023-06-11 MED ORDER — FINASTERIDE 5 MG PO TABS: 5.0000 mg | ORAL_TABLET | Freq: Every day | ORAL | 0 refills | Status: AC

## 2023-06-11 NOTE — Discharge Instructions (Signed)
Agency Name: [facility] [employer] [facility]  Address: [address] [place], [place] [address] Phone: [phone] Website: [email or web address].[email or web address] Services Offered: Meals on [employer] and Meals with Friends.

## 2023-06-11 NOTE — Care Management Important Message (Signed)
Important Message  Patient Details  Name: Angel [name] MRN: [id number] Date of Birth: 12-Oct-1940   Important Message Given:  N/A - LOS <3 / Initial given by admissions     Corey Harold 06/11/2023, 10:35 AM

## 2023-06-11 NOTE — Progress Notes (Signed)
Mobility Specialist Progress Note:    06/11/23 1345  Mobility  Activity Ambulated with assistance in room;Stood at bedside;Transferred from bed to chair;Ambulated with assistance to bathroom  Level of Assistance Minimal assist, patient does 75% or more  Assistive Device None  Distance Ambulated (ft) 15 ft  Range of Motion/Exercises Active;All extremities  Activity Response Tolerated well  Mobility Referral Yes  Mobility visit 1 Mobility  Mobility Specialist Start Time (ACUTE ONLY) 1320  Mobility Specialist Stop Time (ACUTE ONLY) 1345  Mobility Specialist Time Calculation (min) (ACUTE ONLY) 25 min   Pt received in bathroom, required MinA to stand and ambulate with no AD, CGA to stand and ambulate with RW. Tolerated well, assisted pt with preparation for d/c. Left pt with NT, all needs met.   Lawerance Bach Mobility Specialist Please contact via Special educational needs teacher or  Rehab office at [phone]

## 2023-06-11 NOTE — TOC Transition Note (Addendum)
Transition of Care [facility]) - Discharge Note   Patient Details  Name: Angel [name] MRN: [id number] Date of Birth: July 22, 1940  [facility]) CM/SW Contact:  Beather Arbour Phone Number: 06/11/2023, 10:42 AM   Clinical Narrative:    Patient is discharging today. CSW spoke with [place] with [facility] who patient is already established with for Home Health and made her aware of patient needs. CSW also added Meals on Wheels to AVS for patient to call and see if the meals they provide are more healthier. CSW did reach out to Esmeralda Links with [employer] and left a confidential voicemail for someone to follow up with pt regarding getting him established with Meals [employer]. TOC signing off.    Final next level of care: Home w Home Health Services Barriers to Discharge: Barriers Resolved   Patient Goals and CMS Choice Patient states their goals for this hospitalization and ongoing recovery are:: return home CMS Medicare.gov Compare Post Acute Care list provided to:: Patient Choice offered to / list presented to : Patient [place] ownership interest in [facility].provided to:: Patient    Discharge Placement                  Name of family member notified: Patient Patient and family notified of of transfer: 06/11/23  Discharge Plan and Services Additional resources added to the After Visit Summary for   In-house Referral: Clinical Social Work   Post Acute Care Choice: Resumption of Svcs/PTA Provider                    HH Arranged: RN, PT, OT, Nurse's Aide, Social Work [employer] Agency: [facility] Date HH Agency Contacted: 06/11/23 Time HH Agency Contacted: 1042 Representative spoke with at [facility] Agency: Adele Dan  Social Drivers of Health (SDOH) Interventions SDOH Screenings   Food Insecurity: Patient Declined (06/10/2023)  Housing: Patient Declined (06/10/2023)  Transportation Needs: Patient Declined (06/10/2023)  Utilities:  Patient Declined (06/10/2023)  Financial Resource Strain: Low Risk  (10/28/2022)   Received from [facility]  Physical Activity: Unknown (10/28/2022)   Received from [facility]  Recent Concern: Physical Activity - Inactive (10/28/2022)   [facility]  Social Connections: Patient Declined (06/10/2023)  Recent Concern: Social Connections - Socially Isolated (05/25/2023)  Stress: No Stress Concern Present (10/28/2022)   Received from [facility]  Tobacco Use: Medium Risk (06/10/2023)     Readmission Risk Interventions    06/11/2023   10:41 AM 05/28/2023   11:19 AM  Readmission Risk Prevention Plan  Post Dischage Appt  Not Complete  Medication Screening  Complete  Transportation Screening Complete Complete  HRI or Home Care Consult Complete   Social Work Consult for Recovery Care Planning/Counseling Complete   Palliative Care Screening Not Applicable   Medication Review Ocean[employer]) Complete

## 2023-06-11 NOTE — Progress Notes (Signed)
Mobility Specialist Progress Note:    06/11/23 1055  Mobility  Activity Stood at bedside;Transferred from bed to chair  Level of Assistance Contact guard assist, steadying assist  Assistive Device Front wheel walker  Distance Ambulated (ft) 6 ft  Range of Motion/Exercises Active;All extremities  Activity Response Tolerated well  Mobility Referral Yes  Mobility visit 1 Mobility  Mobility Specialist Start Time (ACUTE ONLY) 1020  Mobility Specialist Stop Time (ACUTE ONLY) 1050  Mobility Specialist Time Calculation (min) (ACUTE ONLY) 30 min   Pt received in bed, NT in room, agreeable to mobility. Required CGA to stand and transfer with RW. Tolerated well, SpO2 95% on 3L after session. Left pt in chair, alarm on. Call bell in reach, all needs met.  Lawerance Bach Mobility Specialist Please contact via Special educational needs teacher or  Rehab office at [phone]

## 2023-06-11 NOTE — Progress Notes (Addendum)
Patient teaching done on Urinary catheter care and Heart Failure. Explained how to empty leg bag and clean his catheter. Then explained low sodium diet and fluid restriction, daily weighing, and importance of medication. Pt. Seemed to comprehend very well. Also Spoke with his daughter and also explained all the same things. Patient also given paperwork on Urinary catheter care and Heart Failure.

## 2023-06-11 NOTE — Discharge Summary (Addendum)
Physician Discharge Summary  Angel [name] GNF:[id number] DOB: 05/09/1941 DOA: 06/09/2023  PCP: Tracey Harries, MD  Admit date: 06/09/2023 Discharge date: 06/11/2023  Admitted From: Home Disposition: Home with home health (refused SNF placement)  Recommendations for Outpatient Follow-up:  Follow up with PCP in 1-2 weeks Follow-up with urology, Dr. Ronne Binning in 1 week for consideration of voiding trial Outpatient follow-up with pulmonology for pulmonary nodule Started on finasteride Please obtain BMP in one week to reassess renal function Please follow up on the following pending results:  Home Health: PT/OT/RN/aide/social work Equipment/Devices: Agricultural consultant  Discharge Condition: Stay able CODE STATUS: Full code Diet recommendation: Heart healthy diet  History of present illness:  Angel [name] is a 83 y.o. male with past medical history significant for HTN, HLD, MI/CAD, pulmonary hypertension, CKD 3A, chronic hypoxic respiratory failure on 2 - 3L [place], kidney stones, chronic diastolic CHF, GERD, renal mass and bladder calculi who presented to the [facility] ED with complaint of abdominal pain and thigh pain. Patient recently had a 1 week hospitalization about 3 weeks ago for CHF exacerbation, he was diuresed 8 L and discharged home on Lasix with instructions to follow-up with cardiology and pulmonology. He presents today with pain in his upper thigh and left abdomen. He also reports mild lower extremity swelling. He reports taking his Lasix since discharge from the hospital but not consistently. He also endorsed decrease p.o. intake and urine output as well as generalized weakness over the last few days but denies any fevers, chills, chest pain, dizziness or worsening shortness of breath.   In the ED, temperature 97.6 F, HR 61, RR 14, BP 107 83, SpO2 99% on 2 L nasal cannula.  On baseline 2 L [place]. Labs significant for sodium 129, BUN/creatinine 67/4.31, BNP 1485, UA with large  hemoglobinuria but no signs of infection.  CT chest/abdomen/pelvis shows stable small bilateral pleural effusions, stable small pericardial effusion, similar 12 mm LUL spiculated nodule, similar 8.9 cm right renal mass, evidence of cirrhosis and small ascites as well as mild bilateral hydroureteronephrosis and distended bladder likely due to obstructive uropathy from enlarged prostate. RLE U/S DVT was negative for DVT.   Patient was given IV LR 500 cc bolus.  Foley catheter was placed. TRH was consulted for admission  Hospital course:  Acute renal failure CT on CKD stage IIIa Obstructive uropathy Patient presented with lower abdominal pain found to have significant rise in his creatinine from baseline of 1.2-1.3 to 4.31. Patient found to have distended bladder and hydroureteronephrosis on imaging; likely cause secondary to obstructive uropathy.  Foley catheter was placed.  Patient was continued on tamsulosin 0.4 mg p.o. daily and started on finasteride 5 mg p.o. daily.  Outpatient follow-up with urology for consideration of voiding trial.  Creatinine improved to 2.43 at time of discharge.  Repeat BMP 1 week.   Hypokalemia Repleted during the hospitalization.  Recommend repeat BMP 1 week.   Renal mass Currently being followed by patient's urologist. Imaging shows stable 8.9 cm heterogeneous right renal mass. Continue Foley catheter as above. Outpatient follow-up with urology, Dr. Ronne Binning   Hx pulmonary hypertension Chronic diastolic congestive heart failure, compensated Chronic hypoxic respiratory failure on 2-3 L Patient remains on his baseline 2-3 L [place]. He is in no respiratory distress. Patient not significantly fluid overloaded on exam.  Continue furosemide 40 mg p.o. daily, Metoprolol 12.5 g p.o. twice daily   Cirrhosis Thrombocytopenia CT scan shows nodular hepatic contour compatible with cirrhosis and small volume  abdominal pelvic ascites. Normal LFTs. Albumin 3.1. Outpatient follow-up  with gastroenterology as needed.   Hx MI/CAD Continue aspirin 81 mg p.o. daily. Has refused statin therapy   Hx of lung mass CT scan shows stable 12 mm spiculated nodule in the LUL concerning for malignancy with additional subcentimeter nodules. Follow-up with pulmonology in the outpatient   Weakness/debility/deconditioning: Patient seen by therapy while inpatient with recommendation of SNF placement, refused but amenable to home health on discharge.  Discharge Diagnoses:  Principal Problem:   Acute renal failure superimposed on chronic kidney disease (HCC) Active Problems:   Urine retention   Hydroureteronephrosis   Chronic diastolic HF (heart failure) [facility])    Discharge Instructions  Discharge Instructions     Call MD for:  difficulty breathing, headache or visual disturbances   Complete by: As directed    Call MD for:  extreme fatigue   Complete by: As directed    Call MD for:  persistant dizziness or light-headedness   Complete by: As directed    Call MD for:  persistant nausea and vomiting   Complete by: As directed    Call MD for:  severe uncontrolled pain   Complete by: As directed    Call MD for:  temperature >100.4   Complete by: As directed    Diet - low sodium heart healthy   Complete by: As directed    Increase activity slowly   Complete by: As directed       Allergies as of 06/11/2023       Reactions   Ciprofloxacin Other (See Comments)   Crestor [rosuvastatin] Other (See Comments)   Unknown    Penicillins Other (See Comments)   Per patient causes GI upset with high doses. He says he does OK with shots but gets GI upset with PO penicillins Can take small doses with no issues   Tramadol Other (See Comments)   Confusion   Lisinopril Rash, Cough   Patient says it caused bp to increase and caused a rash on his legs    Macrodantin [nitrofurantoin] Rash        Medication List     TAKE these medications    aspirin EC 81 MG tablet Take 81 mg by  mouth daily.   finasteride 5 MG tablet Commonly known as: PROSCAR Take 1 tablet (5 mg total) by mouth daily. Start taking on: June 12, 2023   furosemide 40 MG tablet Commonly known as: LASIX Take 1 tablet (40 mg total) by mouth daily.   ibuprofen 200 MG tablet Commonly known as: ADVIL Take 200 mg by mouth daily as needed for moderate pain (pain score 4-6).   metoprolol tartrate 25 MG tablet Commonly known as: LOPRESSOR Take 0.5 tablets (12.5 mg total) by mouth 2 (two) times daily.   nitroGLYCERIN 0.4 MG SL tablet Commonly known as: NITROSTAT Place 1 tablet (0.4 mg total) under the tongue every 5 (five) minutes x 3 doses as needed for chest pain (if after 3rd dose no relief proceed to ED or call 911).   tamsulosin 0.4 MG Caps capsule Commonly known as: FLOMAX Take 1 capsule (0.4 mg total) by mouth daily after supper.               Durable Medical Equipment  (From admission, onward)           Start     Ordered   06/10/23 1557  For home use only DME Walker rolling  Once  Comments: Patient fatigues easily during ambulation and will benefit from use of RW for safety and endurance  Question Answer Comment  Walker: With 5 Inch Wheels   Patient needs a walker to treat with the following condition Gait difficulty      06/10/23 1557            Follow-up Information     Tracey Harries, MD. Schedule an appointment as soon as possible for a visit in 1 week(s).   Specialty: Family Medicine Contact information: [address] [place] [address] [phone]         Malen Gauze, MD. Schedule an appointment as soon as possible for a visit in 1 week(s).   Specialty: Urology Contact information: [address]  [address] [place] [address] [phone]                Allergies  Allergen Reactions   Ciprofloxacin Other (See Comments)   Crestor [Rosuvastatin] Other (See Comments)    Unknown    Penicillins  Other (See Comments)    Per patient causes GI upset with high doses. He says he does OK with shots but gets GI upset with PO penicillins Can take small doses with no issues   Tramadol Other (See Comments)    Confusion   Lisinopril Rash and Cough    Patient says it caused bp to increase and caused a rash on his legs    Macrodantin [Nitrofurantoin] Rash    Consultations: None   Procedures/Studies: DG HIP UNILAT WITH PELVIS 2-3 VIEWS LEFT Result Date: 06/10/2023 CLINICAL DATA:  Left hip pain for 2 weeks EXAM: DG HIP (WITH OR WITHOUT PELVIS) 2-3V LEFT COMPARISON:  CT 06/09/2023 FINDINGS: Bones are demineralized. There is no evidence of hip fracture or dislocation. 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 No lytic or sclerotic bone lesion. 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 No acute findings. Electronically Signed   By: Duanne Guess D.O.   On: 06/10/2023 14:00   CT CHEST ABDOMEN PELVIS WO CONTRAST Result Date: 06/09/2023 CLINICAL DATA:  Unintended weight loss and left-sided abdominal pain. Known kidney cancer on the right side. EXAM: CT CHEST, ABDOMEN AND PELVIS WITHOUT CONTRAST TECHNIQUE: Multidetector CT imaging of the chest, abdomen and pelvis was performed following the standard protocol without IV contrast. RADIATION DOSE REDUCTION: This exam was performed according to the departmental dose-optimization program which includes automated exposure control, adjustment of the mA and/or kV according to patient size and/or use of iterative reconstruction technique. COMPARISON:  Renal ultrasound 05/27/2023; CTA chest 05/25/2023; MRI abdomen 07/30/2021; CT chest abdomen and pelvis 04/04/2020 FINDINGS: CT CHEST FINDINGS Cardiovascular: Similar small pericardial effusion. Coronary artery and aortic atherosclerotic calcification. Mediastinum/Nodes: Trachea and esophagus are unremarkable. No thoracic adenopathy noting decreased sensitivity on noncontrast exam.  Lungs/Pleura: Small bilateral pleural effusions similar to 05/25/2023. Similar 12 mm spiculated nodule in the left upper lobe (2/14). Numerous additional subcentimeter nodules are also unchanged. Biapical pleural-parenchymal scarring. No pneumothorax. Musculoskeletal: No acute fracture or destructive osseous lesion. CT ABDOMEN PELVIS FINDINGS Hepatobiliary: Nodular hepatic contour compatible with cirrhosis. Cholelithiasis. No biliary dilation. Pancreas: Unremarkable. Spleen: Unremarkable. Adrenals/Urinary Tract: Stable adrenal glands. 8.9 x 7.4 cm heterogenous right renal mass is grossly similar in size to ultrasound 05/27/2023 given differences in technique. No renal or ureteral calculi. Mild bilateral hydroureteronephrosis. Bladder wall calcifications or layering stones in the right posterior bladder. The bladder is distended. Stomach/Bowel: Normal caliber large and small bowel. Colonic diverticulosis without diverticulitis. Stomach is  within normal limits. Vascular/Lymphatic: 3.2 cm infrarenal abdominal aortic aneurysm. Aortic atherosclerotic calcification. No lymphadenopathy. Reproductive: Enlarged prostate. Other: Small volume abdominopelvic ascites. Mesenteric edema and vascular engorgement in the central mesentery. Musculoskeletal: No acute fracture or destructive osseous lesion. IMPRESSION: 1. Similar small bilateral pleural effusions 2. Similar small pericardial effusion. 3. Similar 12 mm spiculated nodule in the left upper lobe concerning for malignancy. Numerous additional subcentimeter nodules are also unchanged. 4. 8.9 cm heterogenous right renal mass is grossly similar in size to ultrasound 05/27/2023 given differences in technique. 5. Mild bilateral hydroureteronephrosis and distended bladder likely due to obstructive uropathy from enlarged prostate. 6. Nodular hepatic contour compatible with cirrhosis and small volume abdominopelvic ascites. 7. 3.2 cm infrarenal abdominal aortic aneurysm. Recommend  follow-up ultrasound every 3 years. Aortic Atherosclerosis (ICD10-I70.0). Electronically Signed   By: Minerva Fester M.D.   On: 06/09/2023 17:02   US Venous Img Lower Unilateral Right Result Date: 06/09/2023 CLINICAL DATA:  edema EXAM: RIGHT LOWER EXTREMITY VENOUS DOPPLER ULTRASOUND TECHNIQUE: Gray-scale sonography with compression, as well as color and duplex ultrasound, were performed to evaluate the deep venous system(s) from the level of the common femoral vein through the popliteal and proximal calf veins. COMPARISON:  Lower extremity venous duplex, 05/25/2023. FINDINGS: VENOUS Normal compressibility of the common femoral, superficial femoral, and popliteal veins, as well as the visualized calf veins. Visualized portions of profunda femoral vein and great saphenous vein unremarkable. No filling defects to suggest DVT on grayscale or color Doppler imaging. Doppler waveforms show normal direction of venous flow, normal respiratory plasticity and response to augmentation. Limited views of the contralateral common femoral vein are unremarkable. OTHER No evidence of superficial thrombophlebitis or abnormal fluid collection. Limitations: none IMPRESSION: No evidence of femoropopliteal DVT or superficial thrombophlebitis within the RIGHT lower extremity. Roanna Banning, MD [facility] Electronically Signed   By: Roanna Banning M.D.   On: 06/09/2023 16:07   DG CHEST PORT 1 VIEW Result Date: 05/27/2023 CLINICAL DATA:  Shortness of breath.  Hypoxia EXAM: PORTABLE CHEST 1 VIEW COMPARISON:  X-ray 05/24/2023.  CTA 05/25/2023. FINDINGS: Hyperinflation with chronic changes. More focal opacity in the right lung base with a tiny right effusion, decreasing from previous. Enlarged cardiopericardial silhouette. No pneumothorax or edema. Degenerative changes of the spine. Overlapping cardiac leads. Spiculated left upper lobe nodule is not as well seen on this x-ray.  IMPRESSION: Decreased right lung base opacity with small effusion. Recommend continued follow up Spiculated nodule in the left upper lung on prior CT is not well seen on this x-ray. Please correlate with the prior CT recommendations Electronically Signed   By: Karen Kays M.D.   On: 05/27/2023 17:18   US RENAL Result Date: 05/27/2023 CLINICAL DATA:  Right renal mass. EXAM: RENAL / URINARY TRACT ULTRASOUND COMPLETE COMPARISON:  Renal ultrasound 02/26/2023. FINDINGS: Right Kidney: Renal measurements: 9.4 x 5.8 x 5.2 cm = volume: 148 mL. No collecting system dilatation. There is heterogeneous large mass along the lower pole of the right kidney measuring 8.1 cm. Please correlate for any known history. Previously seen at 8.8 cm. Left Kidney: Renal measurements: 9.6 x 4.8 x 3.7 cm = volume: 89 mL. Echogenicity within normal limits. No mass or hydronephrosis visualized. Bladder: Contracted with a Foley catheter. Other: Scattered ascites.  Right-sided pleural effusion. IMPRESSION: No collecting system dilatation of either kidney. Large heterogeneous lobular lower pole right-sided renal mass. Please correlate for known history and any history of a malignant process. Ascites.  Right pleural effusion Electronically Signed   By: Karen Kays M.D.   On: 05/27/2023 17:16   CT Angio Chest Pulmonary Embolism (PE) W or WO Contrast Result Date: 05/25/2023 CLINICAL DATA:  Short of breath.  * Tracking Code: BO * EXAM: CT ANGIOGRAPHY CHEST WITH CONTRAST TECHNIQUE: Multidetector CT imaging of the chest was performed using the standard protocol during bolus administration of intravenous contrast. Multiplanar CT image reconstructions and MIPs were obtained to evaluate the vascular anatomy. RADIATION DOSE REDUCTION: This exam was performed according to the departmental dose-optimization program which includes automated exposure control, adjustment of the mA and/or kV according to patient size and/or use of iterative reconstruction  technique. CONTRAST:  75mL OMNIPAQUE IOHEXOL 350 MG/ML SOLN COMPARISON:  None Available. FINDINGS: Cardiovascular: No filling defects within the pulmonary arteries to suggest acute pulmonary embolism. Mediastinum/Nodes: No axillary or supraclavicular adenopathy. No mediastinal or hilar adenopathy. No pericardial fluid. Esophagus normal. Lungs/Pleura: Persistent spiculated LEFT upper lobe pulmonary nodule measures 14 mm 12 mm (image 39/series 6) compared to 16 mm x 12 mm on prior. Visually lesion appears very similar. Smaller LEFT upper lobe nodule measuring 6 mm (image 45) is unchanged. Several scattered subcentimeter RIGHT lung nodules are unchanged. New layering small to moderate sized RIGHT pleural effusion. Small LEFT pleural effusion. Upper Abdomen: Limited view of the liver, kidneys, pancreas are unremarkable. Normal adrenal glands. Musculoskeletal: No aggressive osseous lesion. Review of the MIP images confirms the above findings. IMPRESSION: 1. Persistent spiculated LEFT upper lobe pulmonary nodule. No significant interval changed in 4 month interval. Lesion remains concerning for bronchogenic carcinoma. Consider FDG PET scan versus tissue sampling. 2. No evidence acute pulmonary embolism. 3. New bilateral pleural effusions. These results will be called to the ordering clinician or representative by the Radiologist Assistant, and communication documented in the PACS or [employer]. Electronically Signed   By: Genevive Bi M.D.   On: 05/25/2023 14:26   ECHOCARDIOGRAM COMPLETE Result Date: 05/25/2023    ECHOCARDIOGRAM REPORT   Patient Name:   Angel [name] Date of Exam: 05/25/2023 Medical Rec #:  [id number]     Height:       70.0 in Accession #:    [id number]    Weight:       136.7 lb Date of Birth:  10-22-1940    BSA:          1.776 m Patient Age:    82 years      BP:           115/69 mmHg Patient Gender: M             HR:           81 bpm. [facility] Procedure: 2D Echo, Cardiac  Doppler and Color Doppler Indications:    CHF-Acute Diastolic I50.31  History:        Patient has prior history of Echocardiogram examinations, most                 recent 02/27/2023. Previous Myocardial Infarction and CAD; Risk                 Factors:Hypertension and Dyslipidemia.  Sonographer:    Celesta Gentile RCS Referring Phys: [id number] OLADAPO ADEFESO IMPRESSIONS  1. Left ventricular ejection fraction, by estimation, is 45 to 50%. The left ventricle has mildly decreased function. The left ventricle demonstrates global hypokinesis. Left ventricular diastolic parameters are indeterminate.  2. Right ventricular systolic function is low normal. The right  ventricular size is mildly enlarged. There is severely elevated pulmonary artery systolic pressure.  3. Right atrial size was severely dilated.  4. Moderate pericardial effusion. The pericardial effusion is posterior to the left ventricle and the left atrium. There is no evidence of cardiac tamponade.  5. The mitral valve is normal in structure. No evidence of mitral valve regurgitation. No evidence of mitral stenosis.  6. Tricuspid valve regurgitation is mild to moderate.  7. The aortic valve is normal in structure. Aortic valve regurgitation is mild. Aortic valve sclerosis/calcification is present, without any evidence of aortic stenosis.  8. The inferior vena cava is dilated in size with <50% respiratory variability, suggesting right atrial pressure of 15 mmHg. FINDINGS  Left Ventricle: Left ventricular ejection fraction, by estimation, is 45 to 50%. The left ventricle has mildly decreased function. The left ventricle demonstrates global hypokinesis. The left ventricular internal cavity size was normal in size. There is  no left ventricular hypertrophy. Left ventricular diastolic parameters are indeterminate. Right Ventricle: The right ventricular size is mildly enlarged. No increase in right ventricular wall thickness. Right ventricular systolic function is low  normal. There is severely elevated pulmonary artery systolic pressure. The tricuspid regurgitant velocity is 4.23 m/s, and with an assumed right atrial pressure of 15 mmHg, the estimated right ventricular systolic pressure is 86.6 mmHg. Left Atrium: Left atrial size was normal in size. Right Atrium: Right atrial size was severely dilated. Pericardium: A moderately sized pericardial effusion is present. The pericardial effusion is posterior to the left ventricle and the left atrium. There is no evidence of cardiac tamponade. Presence of epicardial fat layer. Mitral Valve: The mitral valve is normal in structure. No evidence of mitral valve regurgitation. No evidence of mitral valve stenosis. Tricuspid Valve: The tricuspid valve is normal in structure. Tricuspid valve regurgitation is mild to moderate. No evidence of tricuspid stenosis. Aortic Valve: The aortic valve is normal in structure. Aortic valve regurgitation is mild. Aortic regurgitation PHT measures 375 msec. Aortic valve sclerosis/calcification is present, without any evidence of aortic stenosis. Pulmonic Valve: The pulmonic valve was normal in structure. Pulmonic valve regurgitation is not visualized. No evidence of pulmonic stenosis. Aorta: The aortic root is normal in size and structure. Venous: The inferior vena cava is dilated in size with less than 50% respiratory variability, suggesting right atrial pressure of 15 mmHg. IAS/Shunts: No atrial level shunt detected by color flow Doppler.  LEFT VENTRICLE PLAX 2D LVIDd:         4.30 cm   Diastology LVIDs:         3.00 cm   LV e' medial:    5.77 cm/s LV PW:         0.90 cm   LV E/e' medial:  18.7 LV IVS:        1.00 cm   LV e' lateral:   6.85 cm/s LVOT diam:     1.90 cm   LV E/e' lateral: 15.8 LV SV:         47 LV SV Index:   26 LVOT Area:     2.84 cm  RIGHT VENTRICLE RV S prime:     9.57 cm/s TAPSE (M-mode): 1.6 cm LEFT ATRIUM             Index        RIGHT ATRIUM           Index LA diam:        3.35 cm  1.89 cm/m   RA  Area:     36.90 cm LA Vol (A2C):   32.4 ml 18.25 ml/m  RA Volume:   156.00 ml 87.86 ml/m LA Vol (A4C):   24.6 ml 13.85 ml/m LA Biplane Vol: 29.4 ml 16.56 ml/m  AORTIC VALVE LVOT Vmax:   94.00 cm/s LVOT Vmean:  59.000 cm/s LVOT VTI:    0.165 m AI PHT:      375 msec  AORTA Ao Root diam: 3.30 cm MITRAL VALVE                TRICUSPID VALVE MV Area (PHT): 4.60 cm     TR Peak grad:   71.6 mmHg MV Decel Time: 165 msec     TR Vmax:        423.00 cm/s MV E velocity: 108.00 cm/s                             SHUNTS                             Systemic VTI:  0.16 m                             Systemic Diam: 1.90 cm Kardie Tobb DO Electronically signed by Thomasene Ripple DO Signature Date/Time: 05/25/2023/2:20:34 PM    Final    US Venous Img Lower Bilateral (DVT) Result Date: 05/25/2023 CLINICAL DATA:  Bilateral lower extremity edema EXAM: BILATERAL LOWER EXTREMITY VENOUS DOPPLER ULTRASOUND TECHNIQUE: Gray-scale sonography with graded compression, as well as color Doppler and duplex ultrasound were performed to evaluate the lower extremity deep venous systems from the level of the common femoral vein and including the common femoral, femoral, profunda femoral, popliteal and calf veins including the posterior tibial, peroneal and gastrocnemius veins when visible. The superficial great saphenous vein was also interrogated. Spectral Doppler was utilized to evaluate flow at rest and with distal augmentation maneuvers in the common femoral, femoral and popliteal veins. COMPARISON:  None Available. FINDINGS: RIGHT LOWER EXTREMITY Common Femoral Vein: No evidence of thrombus. Normal compressibility, respiratory phasicity and response to augmentation. Saphenofemoral Junction: No evidence of thrombus. Normal compressibility and flow on color Doppler imaging. Profunda Femoral Vein: No evidence of thrombus. Normal compressibility and flow on color Doppler imaging. Femoral Vein: No evidence of thrombus. Normal  compressibility, respiratory phasicity and response to augmentation. Popliteal Vein: No evidence of thrombus. Normal compressibility, respiratory phasicity and response to augmentation. Calf Veins: No evidence of thrombus. Normal compressibility and flow on color Doppler imaging. Superficial Great Saphenous Vein: No evidence of thrombus. Normal compressibility. Venous Reflux:  None. Other Findings:  Extensive subcutaneous edema. LEFT LOWER EXTREMITY Common Femoral Vein: No evidence of thrombus. Normal compressibility, respiratory phasicity and response to augmentation. Saphenofemoral Junction: No evidence of thrombus. Normal compressibility and flow on color Doppler imaging. Profunda Femoral Vein: No evidence of thrombus. Normal compressibility and flow on color Doppler imaging. Femoral Vein: No evidence of thrombus. Normal compressibility, respiratory phasicity and response to augmentation. Popliteal Vein: No evidence of thrombus. Normal compressibility, respiratory phasicity and response to augmentation. Calf Veins: No evidence of thrombus. Normal compressibility and flow on color Doppler imaging. Superficial Great Saphenous Vein: No evidence of thrombus. Normal compressibility. Venous Reflux:  None. Other Findings:  Extensive subcutaneous edema. IMPRESSION: No evidence of deep venous thrombosis in either lower extremity. Extensive bilateral lower extremity edema. Electronically  Signed   By: Malachy Moan M.D.   On: 05/25/2023 13:25   DG Chest Port 1 View Result Date: 05/24/2023 CLINICAL DATA:  Shortness of breath EXAM: PORTABLE CHEST 1 VIEW COMPARISON:  07/02/2022 FINDINGS: Mild left basilar atelectasis. Mild right basilar scarring, although superimposed pneumonia is possible. No pleural effusion or pneumothorax. Heart is top-normal in size. IMPRESSION: Mild right basilar scarring, superimposed pneumonia not excluded. Electronically Signed   By: Charline Bills M.D.   On: 05/24/2023 23:12      Subjective: Patient seen examined bedside, resting calmly.  Lying in bed.  No complaints this morning.  Wishes to discharge home with home health, declining SNF placement.  Discussed extensively that we will need close follow-up with his urologist, Dr. Ronne Binning for a voiding trial.  No other specific questions, concerns or complaints at this time.  Denies headache, no dizziness, no chest pain, no palpitations, no shortness of breath, no abdominal pain, no fever/chills/night sweats, no nausea/vomiting/diarrhea, no focal weakness, no fatigue, no paresthesia.  No acute events overnight per nursing staff.  Discharge Exam: Vitals:   06/10/23 2017 06/11/23 0327  BP: (!) 104/52 101/61  Pulse: 69 73  Resp: 16 18  Temp: 97.8 F (36.6 C) 98.2 F (36.8 C)  SpO2: 92% 90%   Vitals:   06/10/23 0810 06/10/23 1608 06/10/23 2017 06/11/23 0327  BP: (!) 94/54 107/62 (!) 104/52 101/61  Pulse: 83 73 69 73  Resp:  16 16 18   Temp:  (!) 97.5 F (36.4 C) 97.8 F (36.6 C) 98.2 F (36.8 C)  TempSrc:   Oral Oral  SpO2: 92% 94% 92% 90%  Weight:    63.4 kg  Height:        Physical Exam: GEN: NAD, alert and oriented x 3, chronically ill/elderly in appearance HEENT: NCAT, PERRL, EOMI, sclera clear, MMM PULM: CTAB w/o wheezes/crackles, normal respiratory effort, on 2 L nasal cannula which is at his baseline CV: RRR w/o M/G/R GI: abd soft, NTND, NABS, no R/G/M GU: Foley catheter noted with clear yellow urine in collection bag MSK: no peripheral edema, muscle strength preserved bilaterally NEURO: No focal neurological deficit PSYCH: normal mood/affect Integumentary: No concerning rashes/lesions/wounds noted on exposed skin surfaces    The results of significant diagnostics from this hospitalization (including imaging, microbiology, ancillary and laboratory) are listed below for reference.     Microbiology: No results found for this or any previous visit (from the past 240 hours).   Labs: BNP  (last 3 results) Recent Labs    05/27/23 0329 05/29/23 0941 06/09/23 1446  BNP 1,721.0* 1,068.0* 1,485.0*   Basic Metabolic Panel: Recent Labs  Lab 06/09/23 1446 06/10/23 0434 06/11/23 0406  NA 129* 136 140  K 3.7 3.4* 3.3*  CL 94* 101 105  CO2 23 21* 24  GLUCOSE 85 70 103*  BUN 67* 62* 53*  CREATININE 4.31* 3.86* 2.93*  CALCIUM 8.3* 8.4* 8.5*  MG  --  1.9 1.7  PHOS  --   --  3.7   Liver Function Tests: Recent Labs  Lab 06/09/23 1446 06/10/23 0434 06/11/23 0406  AST 10* 7* 11*  ALT 7 5 6   ALKPHOS 61 53 54  BILITOT 1.7* 1.4* 1.7*  PROT 6.1* 5.4* 5.5*  ALBUMIN 3.1* 2.8* 2.8*   No results for input(s): "LIPASE", "AMYLASE" in the last 168 hours. No results for input(s): "AMMONIA" in the last 168 hours. CBC: Recent Labs  Lab 06/09/23 1446 06/10/23 0434 06/11/23 0406  WBC 8.3 5.4 6.1  NEUTROABS 6.8  --   --   HGB 15.6 13.7 14.0  HCT 45.2 39.3 40.6  MCV 97.2 93.3 96.4  PLT 122* 114* 133*   Cardiac Enzymes: Recent Labs  Lab 06/09/23 1446  CKTOTAL 42*   BNP: Invalid input(s): "POCBNP" CBG: No results for input(s): "GLUCAP" in the last 168 hours. D-Dimer No results for input(s): "DDIMER" in the last 72 hours. Hgb A1c No results for input(s): "HGBA1C" in the last 72 hours. Lipid Profile No results for input(s): "CHOL", "HDL", "LDLCALC", "TRIG", "CHOLHDL", "LDLDIRECT" in the last 72 hours. Thyroid function studies No results for input(s): "TSH", "T4TOTAL", "T3FREE", "THYROIDAB" in the last 72 hours.  Invalid input(s): "FREET3" Anemia work up No results for input(s): "VITAMINB12", "FOLATE", "FERRITIN", "TIBC", "IRON", "RETICCTPCT" in the last 72 hours. Urinalysis    Component Value Date/Time   COLORURINE YELLOW 06/09/2023 1742   APPEARANCEUR CLEAR 06/09/2023 1742   APPEARANCEUR Clear 03/05/2023 1439   LABSPEC 1.009 06/09/2023 1742   PHURINE 5.0 06/09/2023 1742   GLUCOSEU NEGATIVE 06/09/2023 1742   HGBUR LARGE (A) 06/09/2023 1742   BILIRUBINUR  NEGATIVE 06/09/2023 1742   BILIRUBINUR Negative 03/05/2023 1439   KETONESUR NEGATIVE 06/09/2023 1742   PROTEINUR NEGATIVE 06/09/2023 1742   UROBILINOGEN 0.2 12/03/2020 1104   UROBILINOGEN 0.2 07/25/2012 0446   NITRITE NEGATIVE 06/09/2023 1742   LEUKOCYTESUR TRACE (A) 06/09/2023 1742   Sepsis Labs Recent Labs  Lab 06/09/23 1446 06/10/23 0434 06/11/23 0406  WBC 8.3 5.4 6.1   Microbiology No results found for this or any previous visit (from the past 240 hours).   Time coordinating discharge: Over 30 minutes  SIGNED:   Alvira Philips [place], DO  [facility] 06/11/2023, 10:16 AM

## 2023-06-12 ENCOUNTER — Encounter: Payer: Self-pay | Admitting: Internal Medicine

## 2023-06-12 ENCOUNTER — Ambulatory Visit: Payer: 59 | Admitting: Internal Medicine
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
## 2023-06-20 ENCOUNTER — Encounter: Payer: Self-pay | Admitting: Internal Medicine

## 2023-06-20 ENCOUNTER — Ambulatory Visit (INDEPENDENT_AMBULATORY_CARE_PROVIDER_SITE_OTHER): Payer: 59 | Admitting: Internal Medicine

## 2023-06-20 VITALS — BP 102/62 | HR 77 | Ht 70.0 in | Wt 133.4 lb

## 2023-06-20 DIAGNOSIS — J9611 Chronic respiratory failure with hypoxia: Secondary | ICD-10-CM
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
## 2023-06-20 NOTE — Patient Instructions (Signed)
Make sure you check your oxygen saturation  AT  your highest level of activity (not after you stop)   to be sure it stays over 90% and adjust  02 flow upward to maintain this level if needed but remember to turn it back to previous settings when you stop (to conserve your supply).   Please schedule a follow up visit in 3 months but call sooner if needed  
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
## 2023-07-07 ENCOUNTER — Ambulatory Visit (INDEPENDENT_AMBULATORY_CARE_PROVIDER_SITE_OTHER): Payer: 59 | Admitting: Urology

## 2023-07-07 ENCOUNTER — Telehealth: Payer: Self-pay

## 2023-07-07 ENCOUNTER — Encounter: Payer: Self-pay | Admitting: Urology

## 2023-07-07 VITALS — BP 121/60 | HR 90

## 2023-07-07 DIAGNOSIS — N401 Enlarged prostate with lower urinary tract symptoms: Secondary | ICD-10-CM | POA: Diagnosis not present

## 2023-07-07 DIAGNOSIS — R339 Retention of urine, unspecified: Secondary | ICD-10-CM

## 2023-07-07 DIAGNOSIS — N138 Other obstructive and reflux uropathy: Secondary | ICD-10-CM | POA: Diagnosis not present

## 2023-07-07 MED ORDER — TAMSULOSIN HCL 0.4 MG PO CAPS: 0.4000 mg | ORAL_CAPSULE | Freq: Every day | ORAL | 11 refills | Status: DC

## 2023-07-07 MED ORDER — CIPROFLOXACIN HCL 500 MG PO TABS
500.0000 mg | ORAL_TABLET | Freq: Once | ORAL | Status: AC
  Administered 2023-07-07: 500 mg via ORAL

## 2023-07-07 NOTE — Patient Instructions (Addendum)
 Benign Prostatic Hyperplasia  Benign prostatic hyperplasia (BPH) is an enlarged prostate gland that is caused by the normal aging process. The prostate may get bigger as a man gets older. The condition is not caused by cancer. The prostate is a walnut-sized gland that is involved in the production of semen. It is located in front of the rectum and below the bladder. The bladder stores urine. The urethra carries stored urine out of the body. An enlarged prostate can press on the urethra. This can make it harder to pass urine. The buildup of urine in the bladder can cause infection. Back pressure and infection may progress to bladder damage and kidney (renal) failure. What are the causes? This condition is part of the normal aging process. However, not all men develop problems from this condition. If the prostate enlarges away from the urethra, urine flow will not be blocked. If it enlarges toward the urethra and compresses it, there will be problems passing urine. What increases the risk? This condition is more likely to develop in men older than 50 years. What are the signs or symptoms? Symptoms of this condition include: Getting up often during the night to urinate. Needing to urinate frequently during the day. Difficulty starting urine flow. Decrease in size and strength of your urine stream. Leaking (dribbling) after urinating. Inability to pass urine. This needs immediate treatment. Inability to completely empty your bladder. Pain when you pass urine. This is more common if there is also an infection. Urinary tract infection (UTI). How is this diagnosed? This condition is diagnosed based on your medical history, a physical exam, and your symptoms. Tests will also be done, such as: A post-void bladder scan. This measures any amount of urine that may remain in your bladder after you finish urinating. A digital rectal exam. In a rectal exam, your health care provider checks your prostate by  putting a lubricated, gloved finger into your rectum to feel the back of your prostate gland. This exam detects the size of your gland and any abnormal lumps or growths. An exam of your urine (urinalysis). A prostate specific antigen (PSA) screening. This is a blood test used to screen for prostate cancer. An ultrasound. This test uses sound waves to electronically produce a picture of your prostate gland. Your health care provider may refer you to a specialist in kidney and prostate diseases (urologist). How is this treated? Once symptoms begin, your health care provider will monitor your condition (active surveillance or watchful waiting). Treatment for this condition will depend on the severity of your condition. Treatment may include: Observation and yearly exams. This may be the only treatment needed if your condition and symptoms are mild. Medicines to relieve your symptoms, including: Medicines to shrink the prostate. Medicines to relax the muscle of the prostate. Surgery in severe cases. Surgery may include: Prostatectomy. In this procedure, the prostate tissue is removed completely through an open incision or with a laparoscope or robotics. Transurethral resection of the prostate (TURP). In this procedure, a tool is inserted through the opening at the tip of the penis (urethra). It is used to cut away tissue of the inner core of the prostate. The pieces are removed through the same opening of the penis. This removes the blockage. Transurethral incision (TUIP). In this procedure, small cuts are made in the prostate. This lessens the prostate's pressure on the urethra. Transurethral microwave thermotherapy (TUMT). This procedure uses microwaves to create heat. The heat destroys and removes a small amount of  prostate tissue. Transurethral needle ablation (TUNA). This procedure uses radio frequencies to destroy and remove a small amount of prostate tissue. Interstitial laser coagulation (ILC).  This procedure uses a laser to destroy and remove a small amount of prostate tissue. Transurethral electrovaporization (TUVP). This procedure uses electrodes to destroy and remove a small amount of prostate tissue. Prostatic urethral lift. This procedure inserts an implant to push the lobes of the prostate away from the urethra. Follow these instructions at home: Take over-the-counter and prescription medicines only as told by your health care provider. Monitor your symptoms for any changes. Contact your health care provider with any changes. Avoid drinking large amounts of liquid before going to bed or out in public. Avoid or reduce how much caffeine or alcohol you drink. Give yourself time when you urinate. Keep all follow-up visits. This is important. Contact a health care provider if: You have unexplained back pain. Your symptoms do not get better with treatment. You develop side effects from the medicine you are taking. Your urine becomes very dark or has a bad smell. Your lower abdomen becomes distended and you have trouble passing urine. Get help right away if: You have a fever or chills. You suddenly cannot urinate. You feel light-headed or very dizzy, or you faint. There are large amounts of blood or clots in your urine. Your urinary problems become hard to manage. You develop moderate to severe low back or flank pain. The flank is the side of your body between the ribs and the hip. These symptoms may be an emergency. Get help right away. Call 911. Do not wait to see if the symptoms will go away. Do not drive yourself to the hospital. Summary Benign prostatic hyperplasia (BPH) is an enlarged prostate that is caused by the normal aging process. It is not caused by cancer. An enlarged prostate can press on the urethra. This can make it hard to pass urine. This condition is more likely to develop in men older than 50 years. Get help right away if you suddenly cannot urinate. This  information is not intended to replace advice given to you by your health care provider. Make sure you discuss any questions you have with your health care provider. Document Revised: 11/15/2020 Document Reviewed: 11/15/2020      Step 1 Get all of your supplies ready and place them near you. Step 2 Wash your hands with warm, soapy water or put on gloves. Step 3 Wash around the tip of your penis with warm, soapy water or a castille soap towelette. Step 4 Take catheter out of package and drain the lubricant over toilet. Step 5 Hold the penis at a 45 degree angle from the stomach in one hand and the catheter in the other hand. Step 6 Insert the catheter slowly into your urethra. If there is resistance when the catheter reaches the sphincter muscle,              take a deep breath and gently apply steady pressure.              DO NOT FORCE THE CATHETER Step 7 When the urine begins to flow, insert another inch and lower penis. Allow the urine to flow into the toilet. Step 8 When the flow of urine stops, slowly remove the catheter.  Elsevier Patient Education  2024 [employer].

## 2023-07-07 NOTE — Progress Notes (Signed)
 Fill and Pull Catheter Removal  Patient is present today for a catheter removal.  of sterile water was instilled into the bladder when the patient felt the urge to urinate. 10ml of water was then drained from the balloon.  A 16FR foley cath was removed from the bladder no complications were noted .  Foley catheter intact and time of removal. Patient as then given some time to void on their own.  Patient cannot void on their own after some time.  Patient tolerated well.  One oral prophylactic antibiotic given per MD orders  Performed by: Guss Bunde, CMA  Follow up/ Additional notes: MD to see after.  Patient to return this afternoon for pvr.

## 2023-07-07 NOTE — Telephone Encounter (Signed)
 Patient called in this morning after having a voiding trial, patient state's he was given an  Antibiotic in office after foley cath was removed. Patient is advised to increase fluids and voided which should help the burning decrease over time. Patient voiced understanding.

## 2023-07-07 NOTE — Progress Notes (Signed)
 07/07/2023 9:08 AM   Ramonita Lab 1940-06-12 [id number]  Referring provider: Tracey Harries, MD [address] [place],  [place] [address]  No chief complaint on file.   HPI: He was hospitalized 1 month ago with CHF and urinary retention. He had a foley placed during his hospitalization. He was restarted on flomax but stopped the medication last week. He wants the foley out today.    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    Surgical History: Past Surgical History:  Procedure Laterality Date   COLONOSCOPY     COLONOSCOPY     CORONARY ANGIOPLASTY  2010   Dr Eden Emms   CYSTOSCOPY WITH LITHOLAPAXY N/A 02/24/2017   Procedure: CYSTOSCOPY WITH LITHOLAPAXY;  Surgeon: Malen Gauze, MD;  Location: [facility];  Service: Urology;  Laterality: N/A;   CYSTOSCOPY WITH URETHRAL DILATATION  02/24/2017   Procedure: CYSTOSCOPY WITH URETHRAL DILATATION;  Surgeon: Malen Gauze, MD;  Location: [facility];  Service: Urology;;   DOPPLER ECHOCARDIOGRAPHY     EAR CYST EXCISION Left 07/23/2012   Procedure: EXCISION LEFT EAR LOBE;  Surgeon: Suzanna Obey, MD;  Location: [facility];  Service: ENT;  Laterality: Left;   HOLMIUM LASER APPLICATION N/A 02/24/2017   Procedure: HOLMIUM LASER APPLICATION OF BLADDER CALCULI;  Surgeon: Malen Gauze, MD;  Location: [facility];  Service: Urology;  Laterality: N/A;   IR RADIOLOGIST EVAL & MGMT  11/28/2016    Home  Medications:  Allergies as of 07/07/2023       Reactions   Ciprofloxacin Other (See Comments)   Crestor [rosuvastatin] Other (See Comments)   Unknown    Penicillins Other (See Comments)   Per patient causes GI upset with high doses. He says he does OK with shots but gets GI upset with PO penicillins Can take small doses with no issues   Tramadol Other (See Comments)   Confusion   Lisinopril Rash, Cough   Patient says it caused bp to increase and caused a rash on his legs    Macrodantin [nitrofurantoin] Rash        Medication List        Accurate as of July 07, 2023  9:08 AM. If you have any questions, ask your nurse or doctor.          aspirin EC 81 MG tablet Take 81 mg by mouth daily.   finasteride 5 MG tablet Commonly known as: PROSCAR Take 1 tablet (5 mg total) by mouth daily.   furosemide 40 MG tablet Commonly known as: LASIX Take 1 tablet (40 mg total) by mouth daily.   ibuprofen 200 MG tablet Commonly known as: ADVIL Take 200 mg by mouth daily as needed for moderate pain (pain score 4-6).  metoprolol tartrate 25 MG tablet Commonly known as: LOPRESSOR Take 0.5 tablets (12.5 mg total) by mouth 2 (two) times daily.   nitroGLYCERIN 0.4 MG SL tablet Commonly known as: NITROSTAT Place 1 tablet (0.4 mg total) under the tongue every 5 (five) minutes x 3 doses as needed for chest pain (if after 3rd dose no relief proceed to ED or call 911).   tamsulosin 0.4 MG Caps capsule Commonly known as: FLOMAX Take 1 capsule (0.4 mg total) by mouth daily after supper.        Allergies:  Allergies  Allergen Reactions   Ciprofloxacin Other (See Comments)   Crestor [Rosuvastatin] Other (See Comments)    Unknown    Penicillins Other (See Comments)    Per patient causes GI upset with high doses. He says he does OK with shots but gets GI upset with PO penicillins Can take small doses with no issues   Tramadol Other (See Comments)    Confusion   Lisinopril Rash  and Cough    Patient says it caused bp to increase and caused a rash on his legs    Macrodantin [Nitrofurantoin] Rash    Family History: No family history on file.  Social History:  reports that he quit smoking about 13 years ago. His smoking use included cigarettes. He started smoking about 58 years ago. He has a 67.5 pack-year smoking history. He has never used smokeless tobacco. He reports that he does not drink alcohol and does not use drugs.  ROS: All other review of systems were reviewed and are negative except what is noted above in HPI  Physical Exam: BP 121/60   Pulse 90   Constitutional:  Alert and oriented, No acute distress. HEENT: [place] AT, moist mucus membranes.  Trachea midline, no masses. Cardiovascular: No clubbing, cyanosis, or edema. Respiratory: Normal respiratory effort, no increased work of breathing. GI: Abdomen is soft, nontender, nondistended, no abdominal masses GU: No CVA tenderness.  Lymph: No cervical or inguinal lymphadenopathy. Skin: No rashes, bruises or suspicious lesions. Neurologic: Grossly intact, no focal deficits, moving all 4 extremities. Psychiatric: Normal mood and affect.  Laboratory Data: Lab Results  Component Value Date   WBC 6.1 06/11/2023   HGB 14.0 06/11/2023   HCT 40.6 06/11/2023   MCV 96.4 06/11/2023   PLT 133 (L) 06/11/2023    Lab Results  Component Value Date   CREATININE 2.93 (H) 06/11/2023    No results found for: "PSA"  No results found for: "TESTOSTERONE"  No results found for: "HGBA1C"  Urinalysis    Component Value Date/Time   COLORURINE YELLOW 06/09/2023 1742   APPEARANCEUR CLEAR 06/09/2023 1742   APPEARANCEUR Clear 03/05/2023 1439   LABSPEC 1.009 06/09/2023 1742   PHURINE 5.0 06/09/2023 1742   GLUCOSEU NEGATIVE 06/09/2023 1742   HGBUR LARGE (A) 06/09/2023 1742   BILIRUBINUR NEGATIVE 06/09/2023 1742   BILIRUBINUR Negative 03/05/2023 1439   KETONESUR NEGATIVE 06/09/2023 1742   PROTEINUR NEGATIVE  06/09/2023 1742   UROBILINOGEN 0.2 12/03/2020 1104   UROBILINOGEN 0.2 07/25/2012 0446   NITRITE NEGATIVE 06/09/2023 1742   LEUKOCYTESUR TRACE (A) 06/09/2023 1742    Lab Results  Component Value Date   LABMICR See below: 03/05/2023   WBCUA 0-5 03/05/2023   LABEPIT 0-10 03/05/2023   MUCUS Present (A) 03/05/2023   BACTERIA NONE SEEN 06/09/2023    Pertinent Imaging:  No results found for this or any previous visit.  Results for orders placed during the hospital encounter of 05/24/23  US Venous  Img Lower Bilateral (DVT)  Narrative CLINICAL DATA:  Bilateral lower extremity edema  EXAM: BILATERAL LOWER EXTREMITY VENOUS DOPPLER ULTRASOUND  TECHNIQUE: Gray-scale sonography with graded compression, as well as color Doppler and duplex ultrasound were performed to evaluate the lower extremity deep venous systems from the level of the common femoral vein and including the common femoral, femoral, profunda femoral, popliteal and calf veins including the posterior tibial, peroneal and gastrocnemius veins when visible. The superficial great saphenous vein was also interrogated. Spectral Doppler was utilized to evaluate flow at rest and with distal augmentation maneuvers in the common femoral, femoral and popliteal veins.  COMPARISON:  None Available.  FINDINGS: RIGHT LOWER EXTREMITY  Common Femoral Vein: No evidence of thrombus. Normal compressibility, respiratory phasicity and response to augmentation.  Saphenofemoral Junction: No evidence of thrombus. Normal compressibility and flow on color Doppler imaging.  Profunda Femoral Vein: No evidence of thrombus. Normal compressibility and flow on color Doppler imaging.  Femoral Vein: No evidence of thrombus. Normal compressibility, respiratory phasicity and response to augmentation.  Popliteal Vein: No evidence of thrombus. Normal compressibility, respiratory phasicity and response to augmentation.  Calf Veins: No evidence of  thrombus. Normal compressibility and flow on color Doppler imaging.  Superficial Great Saphenous Vein: No evidence of thrombus. Normal compressibility.  Venous Reflux:  None.  Other Findings:  Extensive subcutaneous edema.  LEFT LOWER EXTREMITY  Common Femoral Vein: No evidence of thrombus. Normal compressibility, respiratory phasicity and response to augmentation.  Saphenofemoral Junction: No evidence of thrombus. Normal compressibility and flow on color Doppler imaging.  Profunda Femoral Vein: No evidence of thrombus. Normal compressibility and flow on color Doppler imaging.  Femoral Vein: No evidence of thrombus. Normal compressibility, respiratory phasicity and response to augmentation.  Popliteal Vein: No evidence of thrombus. Normal compressibility, respiratory phasicity and response to augmentation.  Calf Veins: No evidence of thrombus. Normal compressibility and flow on color Doppler imaging.  Superficial Great Saphenous Vein: No evidence of thrombus. Normal compressibility.  Venous Reflux:  None.  Other Findings:  Extensive subcutaneous edema.  IMPRESSION: No evidence of deep venous thrombosis in either lower extremity.  Extensive bilateral lower extremity edema.   Electronically Signed By: Malachy Moan M.D. On: 05/25/2023 13:25  No results found for this or any previous visit.  No results found for this or any previous visit.  Results for orders placed during the hospital encounter of 05/24/23  US RENAL  Narrative CLINICAL DATA:  Right renal mass.  EXAM: RENAL / URINARY TRACT ULTRASOUND COMPLETE  COMPARISON:  Renal ultrasound 02/26/2023.  FINDINGS: Right Kidney:  Renal measurements: 9.4 x 5.8 x 5.2 cm = volume: 148 mL. No collecting system dilatation. There is heterogeneous large mass along the lower pole of the right kidney measuring 8.1 cm. Please correlate for any known history. Previously seen at 8.8 cm.  Left Kidney:  Renal  measurements: 9.6 x 4.8 x 3.7 cm = volume: 89 mL. Echogenicity within normal limits. No mass or hydronephrosis visualized.  Bladder:  Contracted with a Foley catheter.  Other:  Scattered ascites.  Right-sided pleural effusion.  IMPRESSION: No collecting system dilatation of either kidney. Large heterogeneous lobular lower pole right-sided renal mass. Please correlate for known history and any history of a malignant process.  Ascites.  Right pleural effusion   Electronically Signed By: Karen Kays M.D. On: 05/27/2023 17:16  No results found for this or any previous visit.  No results found for this or any previous visit.  No results found for  this or any previous visit.   Assessment & Plan:    1. Urinary retention (Primary) We discussed the management of his BPH including continued medical therapy, indwelling foley, CIC, Rezum, Urolift, TURP and simple prostatectomy. After discussing the options the patient has elected to proceed with CIC. Patient is to catheter 3-4 times per day 16 french   - Bladder Voiding Trial - ciprofloxacin (CIPRO) tablet 500 mg  2. Benign prostatic hyperplasia with urinary obstruction -Continue CIC   No follow-ups on file.  Wilkie Aye, MD  [facility] [place]

## 2023-07-07 NOTE — Progress Notes (Signed)
 Bladder Scan completed today.  Patient cannot void prior to the bladder scan. Bladder scan result:  Performed By: Guss Bunde, CMA  Additional notes-  Patient refused to have catheter placed.  Verbal from Dr. Ronne Binning to teach patient CIC.   Continuous Intermittent Catheterization self teaching  Due to urinary retention patient is present today for a teaching of self I & O Catheterization. Patient was given detailed verbal and printed instructions of self catheterization. Patient was cleaned and prepped in a sterile fashion.  With instruction and assistance patient inserted a 16 FR and urine return was noted 450 ml, urine was  Amber in color. Patient tolerated well, no complications were noted Patient was given a sample bag with supplies to take home.  Instructions were given per Dr. Ronne Binning for patient to cath 3 times daily.  An order was placed with [facility] for catheters to be sent to the patient's home. Patient is to follow up in 1 month with MD  Performed by: Guss Bunde, CMA  Additional Notes: patient educated on hygiene before CIC as well as monitoring his symptoms and to call our office if he has symptoms of a UTI or develops a fever.  Patient voiced understanding.

## 2023-07-23 ENCOUNTER — Telehealth: Payer: Self-pay | Admitting: Internal Medicine

## 2023-07-23 NOTE — Telephone Encounter (Signed)
 Patient would like portable oxygen concentrator. Would like to use [employer]. Patient phone number is [phone].

## 2023-07-24 NOTE — Telephone Encounter (Signed)
 Ok to order but may need to qualify him
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
## 2023-07-24 NOTE — Telephone Encounter (Signed)
 Patient is requesting portable oxygen is this okay

## 2023-07-24 NOTE — Telephone Encounter (Signed)
 Called and spoke with patient informed patient of this message, advised patient that he will need to come for an appt. I told patient that  I will call him back once I  spoken withDr Sherene Sires on when to being him in for an appt.

## 2023-07-24 NOTE — Telephone Encounter (Signed)
 Patient is scheduled Friday 3/14 at 9:30am- aware to bring all medications. Nothing further needed.

## 2023-07-25 ENCOUNTER — Ambulatory Visit: Admitting: Internal Medicine

## 2023-07-25 ENCOUNTER — Encounter: Payer: Self-pay | Admitting: Internal Medicine

## 2023-07-25 VITALS — BP 114/69 | HR 88 | Ht 70.0 in | Wt 130.0 lb

## 2023-07-25 DIAGNOSIS — I272 Pulmonary hypertension, unspecified: Secondary | ICD-10-CM | POA: Diagnosis not present

## 2023-07-25 DIAGNOSIS — J9611 Chronic respiratory failure with hypoxia: Secondary | ICD-10-CM

## 2023-07-25 DIAGNOSIS — J84112 Idiopathic pulmonary fibrosis: Secondary | ICD-10-CM

## 2023-07-25 NOTE — Addendum Note (Signed)
 Addended by: Winn Jock on: 07/25/2023 11:35 AM   Modules accepted: Orders

## 2023-07-25 NOTE — Patient Instructions (Addendum)
 Refer to [employer] for Best fit for portable 02   Make sure you check your oxygen saturation  AT  your highest level of activity (not after you stop)   to be sure it stays over 90% and adjust  02 flow upward to maintain this level if needed but remember to turn it back to previous settings when you stop (to conserve your supply).    Please schedule a follow up visit in 3 months but call sooner if needed
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
## 2023-07-25 NOTE — Assessment & Plan Note (Signed)
 HRCT 11/14/20 >>> non diagnotic for UIP  / mpns - repeat HRCT 01/27/23 UIP plus MPN's bigger   Given dx of inoperable renal cell mass and overall geriatric decline don't really feel he would benefit from PF clinic at this point   >>> f/u q 3 m, sooner if needed          Each maintenance medication was reviewed in detail including emphasizing most importantly the difference between maintenance and prns and under what circumstances the prns are to be triggered using an action plan format where appropriate.  Total time for H and P, chart review, counseling, reviewing 02/pulse ox  device(s) and generating customized AVS unique to this office visit / same day charting = 34 min
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
## 2023-07-25 NOTE — Assessment & Plan Note (Signed)
 Echo 08/29/20 1. LVEF  55 to 60%. With Grade 1 diastolic dysfunction   2. Right ventricular systolic function is normal. The right ventricular  size is normal. There is severely elevated PAS  3. The mitral valve is normal in structure. Trivial mitral valve  regurgitation. No evidence of mitral stenosis.   4. Tricuspid valve regurgitation is mild to moderate.   5. The aortic valve is tricuspid. There is mild calcification of the  aortic valve. MILD AI  There is mild thickening of the aortic valve  No AS     6. 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 With nl LA and RA  7. The inferior vena cava is normal in size with greater than 50%  respiratory variability, suggesting right atrial pressure of 3 mmHg.   Advised re WHO 3  PH > rx is maintain adequate 02 sats

## 2023-07-30 ENCOUNTER — Ambulatory Visit (INDEPENDENT_AMBULATORY_CARE_PROVIDER_SITE_OTHER): Payer: 59 | Admitting: Urology

## 2023-07-30 VITALS — BP 126/73 | HR 80

## 2023-07-30 DIAGNOSIS — N401 Enlarged prostate with lower urinary tract symptoms: Secondary | ICD-10-CM

## 2023-07-30 DIAGNOSIS — R339 Retention of urine, unspecified: Secondary | ICD-10-CM | POA: Diagnosis not present

## 2023-07-30 DIAGNOSIS — N138 Other obstructive and reflux uropathy: Secondary | ICD-10-CM

## 2023-07-30 MED ORDER — TAMSULOSIN HCL 0.4 MG PO CAPS
0.4000 mg | ORAL_CAPSULE | Freq: Every day | ORAL | 11 refills | Status: DC
Start: 2023-07-30 — End: 2023-09-16

## 2023-07-30 NOTE — Progress Notes (Signed)
 07/30/2023 11:13 AM   Angel [name] 07-13-1940 [id number]  Referring provider: Tracey Harries, MD [address] [place],  [place] [address]  Followup urinary retention   HPI: Angel [name] is a 82yo here for followup for BPh with urinary retention. He is performing CIC every 4-5 hours and he is getting 150-200cc per CIC. He is on flomax 0.4mg  daily. No dysuria or hematuria. No pelvic pain.   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    Surgical History: Past Surgical History:  Procedure Laterality Date   COLONOSCOPY     COLONOSCOPY     CORONARY ANGIOPLASTY  2010   Dr Eden Emms   CYSTOSCOPY WITH LITHOLAPAXY N/A 02/24/2017   Procedure: CYSTOSCOPY WITH LITHOLAPAXY;  Surgeon: Malen Gauze, MD;  Location: [facility];  Service: Urology;  Laterality: N/A;   CYSTOSCOPY WITH URETHRAL DILATATION  02/24/2017   Procedure: CYSTOSCOPY WITH URETHRAL DILATATION;  Surgeon: Malen Gauze, MD;  Location: [facility];  Service: Urology;;   DOPPLER ECHOCARDIOGRAPHY     EAR CYST EXCISION Left 07/23/2012   Procedure: EXCISION LEFT EAR LOBE;  Surgeon: Suzanna Obey, MD;  Location: [facility];  Service: ENT;  Laterality: Left;   HOLMIUM LASER APPLICATION N/A 02/24/2017   Procedure: HOLMIUM LASER APPLICATION OF BLADDER CALCULI;  Surgeon: Malen Gauze, MD;  Location: [facility];  Service: Urology;  Laterality: N/A;   IR RADIOLOGIST EVAL & MGMT  11/28/2016    Home  Medications:  Allergies as of 07/30/2023       Reactions   Ciprofloxacin Other (See Comments)   Crestor [rosuvastatin] Other (See Comments)   Unknown    Penicillins Other (See Comments)   Per patient causes GI upset with high doses. He says he does OK with shots but gets GI upset with PO penicillins Can take small doses with no issues   Tramadol Other (See Comments)   Confusion   Lisinopril Rash, Cough   Patient says it caused bp to increase and caused a rash on his legs    Macrodantin [nitrofurantoin] Rash        Medication List        Accurate as of July 30, 2023 11:13 AM. If you have any questions, ask your nurse or doctor.          aspirin EC 81 MG tablet Take 81 mg by mouth daily.   finasteride 5 MG tablet Commonly known as: PROSCAR Take 1 tablet (5 mg total) by mouth daily.   furosemide 40 MG tablet Commonly known as: LASIX Take 1 tablet (40 mg total) by mouth daily.   ibuprofen 200 MG tablet Commonly known as: ADVIL Take 200 mg by mouth daily as needed for moderate pain (pain score 4-6).  metoprolol tartrate 25 MG tablet Commonly known as: LOPRESSOR Take 0.5 tablets (12.5 mg total) by mouth 2 (two) times daily.   nitroGLYCERIN 0.4 MG SL tablet Commonly known as: NITROSTAT Place 1 tablet (0.4 mg total) under the tongue every 5 (five) minutes x 3 doses as needed for chest pain (if after 3rd dose no relief proceed to ED or call 911).   tamsulosin 0.4 MG Caps capsule Commonly known as: FLOMAX Take 1 capsule (0.4 mg total) by mouth daily after supper.        Allergies:  Allergies  Allergen Reactions   Ciprofloxacin Other (See Comments)   Crestor [Rosuvastatin] Other (See Comments)    Unknown    Penicillins Other (See Comments)    Per patient causes GI upset with high doses. He says he does OK with shots but gets GI upset with PO penicillins Can take small doses with no issues   Tramadol Other (See Comments)    Confusion   Lisinopril Rash and  Cough    Patient says it caused bp to increase and caused a rash on his legs    Macrodantin [Nitrofurantoin] Rash    Family History: No family history on file.  Social History:  reports that he quit smoking about 13 years ago. His smoking use included cigarettes. He started smoking about 58 years ago. He has a 67.5 pack-year smoking history. He has never used smokeless tobacco. He reports that he does not drink alcohol and does not use drugs.  ROS: All other review of systems were reviewed and are negative except what is noted above in HPI  Physical Exam: BP 126/73   Pulse 80   Constitutional:  Alert and oriented, No acute distress. HEENT: [place] AT, moist mucus membranes.  Trachea midline, no masses. Cardiovascular: No clubbing, cyanosis, or edema. Respiratory: Normal respiratory effort, no increased work of breathing. GI: Abdomen is soft, nontender, nondistended, no abdominal masses GU: No CVA tenderness.  Lymph: No cervical or inguinal lymphadenopathy. Skin: No rashes, bruises or suspicious lesions. Neurologic: Grossly intact, no focal deficits, moving all 4 extremities. Psychiatric: Normal mood and affect.  Laboratory Data: [name] Results  Component Value Date   WBC 6.1 06/11/2023   HGB 14.0 06/11/2023   HCT 40.6 06/11/2023   MCV 96.4 06/11/2023   PLT 133 (L) 06/11/2023    [name] Results  Component Value Date   CREATININE 2.93 (H) 06/11/2023    No results found for: "PSA"  No results found for: "TESTOSTERONE"  No results found for: "HGBA1C"  Urinalysis    Component Value Date/Time   COLORURINE YELLOW 06/09/2023 1742   APPEARANCEUR CLEAR 06/09/2023 1742   APPEARANCEUR Clear 03/05/2023 1439   LABSPEC 1.009 06/09/2023 1742   PHURINE 5.0 06/09/2023 1742   GLUCOSEU NEGATIVE 06/09/2023 1742   HGBUR LARGE (A) 06/09/2023 1742   BILIRUBINUR NEGATIVE 06/09/2023 1742   BILIRUBINUR Negative 03/05/2023 1439   KETONESUR NEGATIVE 06/09/2023 1742   PROTEINUR NEGATIVE 06/09/2023  1742   UROBILINOGEN 0.2 12/03/2020 1104   UROBILINOGEN 0.2 07/25/2012 0446   NITRITE NEGATIVE 06/09/2023 1742   LEUKOCYTESUR TRACE (A) 06/09/2023 1742    [name] Results  Component Value Date   LABMICR See below: 03/05/2023   WBCUA 0-5 03/05/2023   LABEPIT 0-10 03/05/2023   MUCUS Present (A) 03/05/2023   BACTERIA NONE SEEN 06/09/2023    Pertinent Imaging:  No results found for this or any previous visit.  Results for orders placed during the hospital encounter of 05/24/23  US Venous  Img Lower Bilateral (DVT)  Narrative CLINICAL DATA:  Bilateral lower extremity edema  EXAM: BILATERAL LOWER EXTREMITY VENOUS DOPPLER ULTRASOUND  TECHNIQUE: Gray-scale sonography with graded compression, as well as color Doppler and duplex ultrasound were performed to evaluate the lower extremity deep venous systems from the level of the common femoral vein and including the common femoral, femoral, profunda femoral, popliteal and calf veins including the posterior tibial, peroneal and gastrocnemius veins when visible. The superficial great saphenous vein was also interrogated. Spectral Doppler was utilized to evaluate flow at rest and with distal augmentation maneuvers in the common femoral, femoral and popliteal veins.  COMPARISON:  None Available.  FINDINGS: RIGHT LOWER EXTREMITY  Common Femoral Vein: No evidence of thrombus. Normal compressibility, respiratory phasicity and response to augmentation.  Saphenofemoral Junction: No evidence of thrombus. Normal compressibility and flow on color Doppler imaging.  Profunda Femoral Vein: No evidence of thrombus. Normal compressibility and flow on color Doppler imaging.  Femoral Vein: No evidence of thrombus. Normal compressibility, respiratory phasicity and response to augmentation.  Popliteal Vein: No evidence of thrombus. Normal compressibility, respiratory phasicity and response to augmentation.  Calf Veins: No evidence of thrombus.  Normal compressibility and flow on color Doppler imaging.  Superficial Great Saphenous Vein: No evidence of thrombus. Normal compressibility.  Venous Reflux:  None.  Other Findings:  Extensive subcutaneous edema.  LEFT LOWER EXTREMITY  Common Femoral Vein: No evidence of thrombus. Normal compressibility, respiratory phasicity and response to augmentation.  Saphenofemoral Junction: No evidence of thrombus. Normal compressibility and flow on color Doppler imaging.  Profunda Femoral Vein: No evidence of thrombus. Normal compressibility and flow on color Doppler imaging.  Femoral Vein: No evidence of thrombus. Normal compressibility, respiratory phasicity and response to augmentation.  Popliteal Vein: No evidence of thrombus. Normal compressibility, respiratory phasicity and response to augmentation.  Calf Veins: No evidence of thrombus. Normal compressibility and flow on color Doppler imaging.  Superficial Great Saphenous Vein: No evidence of thrombus. Normal compressibility.  Venous Reflux:  None.  Other Findings:  Extensive subcutaneous edema.  IMPRESSION: No evidence of deep venous thrombosis in either lower extremity.  Extensive bilateral lower extremity edema.   Electronically Signed By: Malachy Moan M.D. On: 05/25/2023 13:25  No results found for this or any previous visit.  No results found for this or any previous visit.  Results for orders placed during the hospital encounter of 05/24/23  US RENAL  Narrative CLINICAL DATA:  Right renal mass.  EXAM: RENAL / URINARY TRACT ULTRASOUND COMPLETE  COMPARISON:  Renal ultrasound 02/26/2023.  FINDINGS: Right Kidney:  Renal measurements: 9.4 x 5.8 x 5.2 cm = volume: 148 mL. No collecting system dilatation. There is heterogeneous large mass along the lower pole of the right kidney measuring 8.1 cm. Please correlate for any known history. Previously seen at 8.8 cm.  Left Kidney:  Renal  measurements: 9.6 x 4.8 x 3.7 cm = volume: 89 mL. Echogenicity within normal limits. No mass or hydronephrosis visualized.  Bladder:  Contracted with a Foley catheter.  Other:  Scattered ascites.  Right-sided pleural effusion.  IMPRESSION: No collecting system dilatation of either kidney. Large heterogeneous lobular lower pole right-sided renal mass. Please correlate for known history and any history of a malignant process.  Ascites.  Right pleural effusion   Electronically Signed By: Karen Kays M.D. On: 05/27/2023 17:16  No results found for this or any previous visit.  No results found for this or any previous visit.  No results found for  this or any previous visit.   Assessment & Plan:    1. Urinary retention (Primary) -continue flomax 0.4mg  daily. Patient to increase interval for CIC to BID  2. Benign prostatic hyperplasia with urinary obstruction Continue flomax 0.4mg  daily   No follow-ups on file.  Wilkie Aye, MD  [facility] [place]

## 2023-08-05 ENCOUNTER — Encounter: Payer: Self-pay | Admitting: Urology

## 2023-08-05 NOTE — Patient Instructions (Signed)

## 2023-08-27 ENCOUNTER — Ambulatory Visit (HOSPITAL_COMMUNITY)
Admission: RE | Admit: 2023-08-27 | Discharge: 2023-08-27 | Disposition: A | Discharge status: Home / Self Care | Payer: 59 | Source: Ambulatory Visit | Attending: Urology | Admitting: Urology

## 2023-08-27 DIAGNOSIS — N2889 Other specified disorders of kidney and ureter: Secondary | ICD-10-CM | POA: Insufficient documentation

## 2023-09-03 ENCOUNTER — Ambulatory Visit: Payer: 59 | Admitting: Urology

## 2023-09-09 ENCOUNTER — Ambulatory Visit
Admission: EM | Admit: 2023-09-09 | Discharge: 2023-09-09 | Disposition: A | Discharge status: Home / Self Care | Attending: Family Medicine | Admitting: Family Medicine

## 2023-09-09 DIAGNOSIS — R0902 Hypoxemia: Secondary | ICD-10-CM

## 2023-09-09 DIAGNOSIS — R21 Rash and other nonspecific skin eruption: Secondary | ICD-10-CM

## 2023-09-09 DIAGNOSIS — J438 Other emphysema: Secondary | ICD-10-CM | POA: Diagnosis not present

## 2023-09-09 MED ORDER — VALACYCLOVIR HCL 1 G PO TABS: 1000.0000 mg | ORAL_TABLET | Freq: Three times a day (TID) | ORAL | 0 refills | Status: DC

## 2023-09-09 MED ORDER — DOXYCYCLINE HYCLATE 100 MG PO CAPS: 100.0000 mg | ORAL_CAPSULE | Freq: Two times a day (BID) | ORAL | 0 refills | Status: DC

## 2023-09-09 NOTE — ED Triage Notes (Signed)
 Pt reports he has burning and itching on his back, shoulders, and chest and believes it to be shingles x 2 weeks

## 2023-09-09 NOTE — Discharge Instructions (Addendum)
 Clean the areas at least once a day with an antiseptic wash such as Hibiclens  or Dial soap.  You may cover the open areas with Vaseline or Aquaphor and a patch bandage and take the full course of antibiotics as some of the areas appear to be becoming infected.  This is not classic appearance for shingles but will cover with Valtrex in case this is shingles.  Follow-up with your primary care provider as soon as possible for a recheck.  Your oxygen was significantly and dangerously low when you arrived today.  Make sure you are wearing your oxygen at all times and go to the emergency department if you are still short of breath and having low readings despite wearing oxygen

## 2023-09-09 NOTE — ED Provider Notes (Signed)
 [facility]    CSN: [id number] Arrival date & time: 09/09/23  1359      History   Chief Complaint No chief complaint on file.   HPI Angel [name] is a 83 y.o. male.   Patient presenting today with about 2 weeks of a widespread itching and burning rash to back, shoulders, chest, abdomen that he is concerned may be shingles.  Denies new soaps or products, exposures to plants or insects, new medications or foods.  So far not trying anything over-the-counter for symptoms.    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    Patient Active Problem List   Diagnosis Date Noted   Urine retention 06/10/2023   Hydroureteronephrosis 06/10/2023   Chronic diastolic HF (heart failure) (HCC) 06/10/2023   Acute renal failure superimposed on chronic kidney disease (HCC) 06/09/2023   Acute on chronic diastolic CHF (congestive heart failure) (HCC) 05/25/2023   Elevated brain natriuretic peptide (BNP) level 05/25/2023   History of pulmonary hypertension 05/25/2023   Acute on chronic respiratory failure with hypoxia (HCC) 05/25/2023   Multiple pulmonary nodules determined by computed tomography of lung 02/11/2023   Pulmonary hypertension (HCC) 11/17/2021   Edema of both lower legs 09/25/2021   Centrilobular emphysema (HCC) 07/17/2021   DOE (dyspnea on exertion) 07/05/2021   Chronic kidney disease, stage 3a (HCC) 12/18/2020   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   Chronic respiratory failure with hypoxia (HCC) 10/19/2020   Acute cystitis without hematuria 07/07/2019   Calculus of bladder 07/07/2019   Right kidney mass 04/05/2018   Renal cell cancer, right (HCC) 05/24/2017   BPH (benign prostatic hyperplasia) 02/24/2017   Diverticulosis of colon 10/10/2016   Tubular adenoma of colon 10/10/2016   Hernia of abdominal wall 07/04/2011   Elevated PSA 06/20/2011   GERD (gastroesophageal reflux disease) 07/10/2010   Osteoarthritis, multiple sites 04/17/2010   ABDOMINAL PAIN-GENERALIZED 02/16/2010   ESSENTIAL HYPERTENSION, BENIGN 02/02/2010   ELEVATED BLOOD PRESSURE 01/31/2010   MIXED HYPERLIPIDEMIA 01/18/2010   CORONARY ATHEROSCLEROSIS NATIVE CORONARY ARTERY 01/18/2010   FATIGUE 01/18/2010    Past Surgical History:  Procedure Laterality Date   COLONOSCOPY     COLONOSCOPY     CORONARY ANGIOPLASTY  2010   Dr Stann Earnest   CYSTOSCOPY WITH LITHOLAPAXY N/A 02/24/2017   Procedure: CYSTOSCOPY WITH LITHOLAPAXY;  Surgeon: Marco Severs, MD;  Location: [facility];  Service: Urology;  Laterality: N/A;   CYSTOSCOPY WITH URETHRAL DILATATION  02/24/2017   Procedure: CYSTOSCOPY WITH URETHRAL DILATATION;  Surgeon: Marco Severs, MD;  Location: [facility];  Service: Urology;;   DOPPLER ECHOCARDIOGRAPHY     EAR CYST EXCISION Left 07/23/2012   Procedure: EXCISION LEFT EAR LOBE;  Surgeon: Vernadine Golas, MD;  Location: [facility];  Service:  ENT;  Laterality: Left;   HOLMIUM LASER APPLICATION N/A 02/24/2017   Procedure: HOLMIUM LASER APPLICATION OF BLADDER CALCULI;  Surgeon: Marco Severs, MD;  Location: [facility];  Service: Urology;  Laterality: N/A;   IR RADIOLOGIST EVAL & MGMT  11/28/2016       Home Medications    Prior to Admission medications   Medication Sig Start Date End Date Taking? Authorizing Provider  doxycycline  (VIBRAMYCIN ) 100 MG capsule Take 1 capsule (100 mg total) by mouth 2 (two) times daily. 09/09/23  Yes Corbin Dess, PA-C   valACYclovir (VALTREX) 1000 MG tablet Take 1 tablet (1,000 mg total) by mouth 3 (three) times daily for 7 days. 09/09/23 09/16/23 Yes Corbin Dess, PA-C  aspirin  EC 81 MG tablet Take 81 mg by mouth daily.    [provider]  finasteride  (PROSCAR ) 5 MG tablet Take 1 tablet (5 mg total) by mouth daily. 06/12/23 09/10/23  [place], Eric J, DO  furosemide  (LASIX ) 40 MG tablet Take 1 tablet (40 mg total) by mouth daily. 05/30/23   Macdonald Savoy, MD  ibuprofen  (ADVIL ) 200 MG tablet Take 200 mg by mouth daily as needed for moderate pain (pain score 4-6).    [provider]  metoprolol  tartrate (LOPRESSOR ) 25 MG tablet Take 0.5 tablets (12.5 mg total) by mouth 2 (two) times daily. 05/28/23   Macdonald Savoy, MD  nitroGLYCERIN  (NITROSTAT ) 0.4 MG SL tablet Place 1 tablet (0.4 mg total) under the tongue every 5 (five) minutes x 3 doses as needed for chest pain (if after 3rd dose no relief proceed to ED or call 911). 04/03/23   Laurann Pollock, MD  tamsulosin  (FLOMAX ) 0.4 MG CAPS capsule Take 1 capsule (0.4 mg total) by mouth daily after supper. 07/30/23   McKenzie, Arden Beck, MD    Family History History reviewed. No pertinent family history.  Social History Social History   Tobacco Use   Smoking status: Former    Current packs/day: 0.00    Average packs/day: 1.5 packs/day for 45.0 years (67.5 ttl pk-yrs)    Types: Cigarettes    Start date: 12/11/1964    Quit date: 12/11/2009    Years since quitting: 13.7   Smokeless tobacco: Never  Vaping Use   Vaping status: Never Used  Substance Use Topics   Alcohol use: No   Drug use: No     Allergies   Ciprofloxacin , Crestor [rosuvastatin], Penicillins, Tramadol , Lisinopril , and Macrodantin  [nitrofurantoin ]   Review of Systems Review of Systems PER HPI  Physical Exam Triage Vital Signs ED Triage Vitals  Encounter Vitals Group     BP 09/09/23 1408 101/64     Systolic BP Percentile --      Diastolic BP Percentile  --      Pulse Rate 09/09/23 1408 77     Resp 09/09/23 1408 20     Temp 09/09/23 1429 97.9 F (36.6 C)     Temp Source 09/09/23 1429 Temporal     SpO2 09/09/23 1408 (!) 85 %     Weight --      Height --      Head Circumference --      Peak Flow --      Pain Score 09/09/23 1409 0     Pain Loc --      Pain Education --      Exclude from Growth Chart --    No data found.  Updated Vital Signs BP 101/64 (BP Location: Right Arm)  Pulse 77   Temp 97.9 F (36.6 C) (Temporal)   Resp 20   SpO2 (!) 85%   Visual Acuity Right Eye Distance:   Left Eye Distance:   Bilateral Distance:    Right Eye Near:   Left Eye Near:    Bilateral Near:     Physical Exam Vitals and nursing note reviewed.  Constitutional:      Appearance: Normal appearance.  HENT:     Head: Atraumatic.  Eyes:     Extraocular Movements: Extraocular movements intact.     Conjunctiva/sclera: Conjunctivae normal.  Cardiovascular:     Rate and Rhythm: Normal rate.  Pulmonary:     Effort: Pulmonary effort is normal.  Musculoskeletal:        General: Normal range of motion.     Cervical back: Normal range of motion and neck supple.  Skin:    General: Skin is warm and dry.     Findings: Rash present.     Comments: Some scabbed and some open ulcerated lesions widespread across abdomen, chest, back and 2 scabbed lesions to the anterior face.  Several open areas are surrounded by erythema and edema but no active drainage or fluctuance  Neurological:     General: No focal deficit present.     Mental Status: He is oriented to person, place, and time.  Psychiatric:        Mood and Affect: Mood normal.        Thought Content: Thought content normal.        Judgment: Judgment normal.      UC Treatments / Results  Labs (all labs ordered are listed, but only abnormal results are displayed) Labs Reviewed - No data to display  EKG   Radiology No results found.  Procedures Procedures (including critical care  time)  Medications Ordered in UC Medications - No data to display  Initial Impression / Assessment and Plan / UC Course  I have reviewed the triage vital signs and the nursing notes.  Pertinent labs & imaging results that were available during my care of the patient were reviewed by me and considered in my medical decision making (see chart for details).     Rash is not limited to unilateral side and not limited to 1 dermatome, widespread diffusely across entire chest, abdomen and back and several lesions to the face so not altogether classic for shingles given his concern will cover with Valtrex.  Will also cover with doxycycline , good home wound care in case secondary bacterial infection.  Unclear etiology of rash at this time.  Close PCP follow-up recommended, particularly if worsening   Of note, patient was hypoxic to 82% in triage.  States he typically wears continuous oxygen but left in the car and "feels fine currently".  He does have a history of emphysema, CHF and other chronic conditions that require continuous oxygen.  He was placed on 2 L of oxygen via nasal cannula throughout his time in clinic and oxygen saturation improved to 97% on this.  Discussed importance of compliant use of his home oxygen  Final Clinical Impressions(s) / UC Diagnoses   Final diagnoses:  Rash  Other emphysema (HCC)  Hypoxia     Discharge Instructions      Clean the areas at least once a day with an antiseptic wash such as Hibiclens  or Dial soap.  You may cover the open areas with Vaseline or Aquaphor and a patch bandage and take the full course of antibiotics as  some of the areas appear to be becoming infected.  This is not classic appearance for shingles but will cover with Valtrex in case this is shingles.  Follow-up with your primary care provider as soon as possible for a recheck.  Your oxygen was significantly and dangerously low when you arrived today.  Make sure you are wearing your oxygen  at all times and go to the emergency department if you are still short of breath and having low readings despite wearing oxygen    ED Prescriptions     Medication Sig Dispense Auth. Provider   valACYclovir (VALTREX) 1000 MG tablet Take 1 tablet (1,000 mg total) by mouth 3 (three) times daily for 7 days. 21 tablet Corbin Dess, PA-C   doxycycline  (VIBRAMYCIN ) 100 MG capsule Take 1 capsule (100 mg total) by mouth 2 (two) times daily. 14 capsule Corbin Dess, [place]      PDMP not reviewed this encounter.   Corbin Dess, [place] 09/09/23 1504

## 2023-09-09 NOTE — ED Notes (Signed)
 Hooked pt up to 2 L of O2

## 2023-09-16 ENCOUNTER — Encounter: Payer: Self-pay | Admitting: Cardiology

## 2023-09-16 ENCOUNTER — Ambulatory Visit: Payer: 59 | Attending: Cardiology | Admitting: Cardiology

## 2023-09-16 VITALS — BP 110/58 | HR 67 | Ht 70.0 in | Wt 140.2 lb

## 2023-09-16 DIAGNOSIS — I272 Pulmonary hypertension, unspecified: Secondary | ICD-10-CM | POA: Diagnosis not present

## 2023-09-16 DIAGNOSIS — I2510 Atherosclerotic heart disease of native coronary artery without angina pectoris: Secondary | ICD-10-CM | POA: Diagnosis not present

## 2023-09-16 MED ORDER — FUROSEMIDE 40 MG PO TABS: 40.0000 mg | ORAL_TABLET | ORAL | Status: AC

## 2023-09-16 NOTE — Patient Instructions (Signed)
 Medication Instructions:  Continue all current medications.   Labwork: none  Testing/Procedures: none  Follow-Up: 6 months   Any Other Special Instructions Will Be Listed Below (If Applicable).   If you need a refill on your cardiac medications before your next appointment, please call your pharmacy.

## 2023-09-16 NOTE — Progress Notes (Signed)
 Clinical Summary Mr. Melfi is a 83 y.o.male seen today for follow up of the following medical problems.      1. CAD - history of prior stent  in 9/ 2011 to RCA  He has refused statin. Some renal dysfunction, not on ARB. Has ACE allergy as well    - no recent chest pain - chronic SOB overall stable, typically on home O2 2-3 L - compliant with meds   2. HTN - higher doses of norvasc  caused some leg swelling and dizziness -significant weight loss over the last several years, HTN resolved.        3. SOB/Pulmonary HTN - 08/2020 echo LVEF 55-60%, grade I dd, normal RV function, severe pulm HTN PASP 72 - PA Thornton Flock had referred to CHF clinic for pulmonary HTN - 10/2020 VQ no PE - 11/2020 CT chest: early/mild interstitial lung disease,  10/2020 PFTs sevre diffusion defect    02/2023 echo: LVEF 50-55%, grade I DD, RV pressure and volume overload, mod RV dysfucntion, severe RVE, severe pulmonary HTN 10/2020 VQ negative  - turned down OSA testing.  - from pulmonary note he turned down RHC  Jan 2025 echo: LVEF 45-50%, low noraml RV function, mild RVE, severe pulm HTN PASP 87, dilated fixed IVC   4. Possible ILD - ongoing workup by pulmonary - PFTs with severe diffusion defect, recent abnormal high res CT   - typically on 2-3 L   5. CKD - Cr 1.4 and stable   6. Renal mass - followed by urology - just monitoring at this time  7. 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 He says he does OK with shots but gets GI upset with PO penicillins Can take small doses with no issues   Tramadol  Other (See Comments)    Confusion   Lisinopril  Rash and Cough    Patient says it caused bp to increase and caused a rash on his legs    Macrodantin  [Nitrofurantoin ] Rash     Current Outpatient Medications  Medication Sig Dispense Refill   aspirin  EC 81 MG tablet Take 81 mg by mouth daily.     doxycycline  (VIBRAMYCIN ) 100 MG capsule Take 1 capsule (100 mg total) by mouth 2 (two) times daily. 14 capsule 0   furosemide  (LASIX ) 40 MG tablet Take 1 tablet (40 mg total) by mouth daily. 30 tablet 1   ibuprofen  (ADVIL ) 200 MG  tablet Take 200 mg by mouth daily as needed for moderate pain (pain score 4-6).     metoprolol  tartrate (LOPRESSOR ) 25 MG tablet Take 0.5 tablets (12.5 mg total) by mouth 2 (two) times daily. 60 tablet 1   nitroGLYCERIN  (NITROSTAT ) 0.4 MG SL tablet Place 1 tablet (0.4 mg total) under the tongue every 5 (five) minutes x 3 doses as needed for chest pain (if after 3rd dose no relief proceed to ED or call 911). 25 tablet 3   tamsulosin  (FLOMAX ) 0.4 MG CAPS capsule Take 1 capsule (0.4 mg total) by mouth daily after supper. 30 capsule 11   valACYclovir  (VALTREX ) 1000 MG tablet Take 1 tablet (1,000 mg total) by mouth 3 (three) times daily for 7 days. 21 tablet 0   No current facility-administered medications for this visit.     Past Surgical History:  Procedure Laterality Date   COLONOSCOPY     COLONOSCOPY     CORONARY ANGIOPLASTY  2010   Dr Stann Earnest   CYSTOSCOPY WITH LITHOLAPAXY N/A 02/24/2017   Procedure: CYSTOSCOPY WITH LITHOLAPAXY;  Surgeon:  Marco Severs, MD;  Location: [facility];  Service: Urology;  Laterality: N/A;   CYSTOSCOPY WITH URETHRAL DILATATION  02/24/2017   Procedure: CYSTOSCOPY WITH URETHRAL DILATATION;  Surgeon: Marco Severs, MD;  Location: [facility];  Service: Urology;;   DOPPLER ECHOCARDIOGRAPHY     EAR CYST EXCISION Left 07/23/2012   Procedure: EXCISION LEFT EAR LOBE;  Surgeon: Vernadine Golas, MD;  Location: [facility] OR;  Service: ENT;  Laterality: Left;   HOLMIUM LASER APPLICATION N/A 02/24/2017   Procedure: HOLMIUM LASER APPLICATION OF BLADDER CALCULI;  Surgeon: Marco Severs, MD;  Location: [facility];  Service: Urology;  Laterality: N/A;   IR RADIOLOGIST EVAL & MGMT  11/28/2016     Allergies  Allergen Reactions   Ciprofloxacin  Other (See Comments)   Crestor [Rosuvastatin] Other (See Comments)    Unknown    Penicillins Other (See Comments)    Per patient causes GI upset with high doses. He says he does OK with shots but gets GI upset with PO penicillins Can take small doses with no issues   Tramadol  Other (See Comments)    Confusion   Lisinopril  Rash and Cough    Patient says it caused bp to increase and caused a rash on his legs    Macrodantin  [Nitrofurantoin ] Rash      No family history on file.   Social History Mr. Closs reports that he quit smoking about 13 years ago. His smoking use included cigarettes. He started smoking about 58 years ago. He has a 67.5 pack-year smoking history. He has never used smokeless tobacco. Mr. Berteau reports no history of alcohol use.    Physical Examination Today's Vitals   09/16/23 1416  BP: (!) 110/58  Pulse: 67  SpO2: 93%  Weight: 140 lb 3.2 oz (63.6 kg)  Height: 5\' 10"  (1.778 m)   Body mass index is 20.12 kg/m.  Gen: resting comfortably, no acute distress HEENT: no scleral icterus, pupils equal round and reactive, no palptable cervical adenopathy,  CV: RRR, no m/rg, no jvd Resp: Clear to auscultation bilaterally GI: abdomen is soft, non-tender,  non-distended, normal bowel sounds, no hepatosplenomegaly MSK: extremities are warm, no edema.  Skin: warm, no rash Neuro:  no focal deficits Psych: appropriate affect   Diagnostic Studies  01/2017 echo Study Conclusions   - Left ventricle: The cavity size was normal. Wall thickness was   increased in a pattern  of mild LVH. Systolic function was normal.   The estimated ejection fraction was in the range of 60% to 65%.   Wall motion was normal; there were no regional wall motion   abnormalities. Doppler parameters are consistent with abnormal   left ventricular relaxation (grade 1 diastolic dysfunction). - Aortic valve: There was mild regurgitation. - Mitral valve: There was mild regurgitation. - Tricuspid valve: There was moderate regurgitation. - Pulmonary arteries: PA peak pressure: 38 mm Hg (S).   Assessment and Plan   1. CAD - denies any recent symptoms, continue current meds.      2.Pulmonary HTN - would appear to be group III pulmonary HTN secondary to his chronic hypoxia and lung disease - his VQ was negative. Patient turned down OSA eval, also turned down RHC in the past.  - primary management would be treatment for his chronic lung disease and hypoxia, no clear role for pulmonary vasodilators.    F/u 6 months     Laurann Pollock, M.D.
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
## 2023-09-18 ENCOUNTER — Ambulatory Visit: Payer: 59 | Admitting: Internal Medicine

## 2023-09-18 ENCOUNTER — Encounter: Payer: Self-pay | Admitting: Internal Medicine

## 2023-09-18 VITALS — BP 108/66 | HR 83 | Ht 70.0 in | Wt 140.6 lb

## 2023-09-18 DIAGNOSIS — J9611 Chronic respiratory failure with hypoxia: Secondary | ICD-10-CM

## 2023-09-18 DIAGNOSIS — J84112 Idiopathic pulmonary fibrosis: Secondary | ICD-10-CM
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
## 2023-09-18 NOTE — Patient Instructions (Signed)
Make sure you check your oxygen saturation  AT  your highest level of activity (not after you stop)   to be sure it stays over 90% and adjust  02 flow upward to maintain this level if needed but remember to turn it back to previous settings when you stop (to conserve your supply).    Please schedule a follow up visit in  6 months but call sooner if needed  

## 2023-09-18 NOTE — Assessment & Plan Note (Signed)
 HRCT 11/14/20 >>> non diagnotic for UIP  / mpns - repeat HRCT 01/27/23 UIP plus MPN's bigger   Offered PF clinic/ declined and doubt he would benefit anyway so just rx 02 for now, eventually likely to need hospice

## 2023-10-20 ENCOUNTER — Other Ambulatory Visit: Payer: Self-pay

## 2023-10-20 ENCOUNTER — Telehealth: Payer: Self-pay | Admitting: Internal Medicine

## 2023-10-20 DIAGNOSIS — J849 Interstitial pulmonary disease, unspecified: Secondary | ICD-10-CM

## 2023-10-20 DIAGNOSIS — J9611 Chronic respiratory failure with hypoxia: Secondary | ICD-10-CM

## 2023-10-20 NOTE — Telephone Encounter (Signed)
 [employer] oxygen order documentation paperwork Faxed and waiting conformation.

## 2023-10-20 NOTE — Telephone Encounter (Signed)
 Faxed successfully and sent to scan.

## 2023-10-22 ENCOUNTER — Telehealth: Payer: Self-pay | Admitting: Internal Medicine

## 2023-10-22 DIAGNOSIS — J9611 Chronic respiratory failure with hypoxia: Secondary | ICD-10-CM

## 2023-10-22 NOTE — Telephone Encounter (Signed)
 CMN received for oxygen concentrator  From [employer]

## 2023-10-22 NOTE — Telephone Encounter (Signed)
 Doesn't mattter to me - whatever works best for the patient and keeps his sats over 90%

## 2023-10-22 NOTE — Telephone Encounter (Signed)
 Per Devra Fontana at [facility] Just wanting to confirm this is to DC O2? If so can provider changer supplies to O2' on the sales order? Also if pt is switching for the POC we can either do a POC eval to qualify him or if the doctor puts in an order we can swap his home fill out for the POC. Just let me know.  Please advise

## 2023-10-27 NOTE — Telephone Encounter (Signed)
 Thank you Gurney Lefort! As soon as it is signed I will send it.

## 2023-10-27 NOTE — Telephone Encounter (Signed)
 Thank you, Alvaro Jim!  I placed the POC eval order

## 2023-10-27 NOTE — Telephone Encounter (Signed)
 Per Devra Fontana at [facility]- So i guess it would be up to the patient. If he decides to stay with us  we will just need an order for a POC EVAL entered by the provider for a POC EVAL. If you need the verbiage i am attaching that below.  Please evaluate and titrate for best fit POC or portable O2 system. RT to evaluate and titrate patient for POC or Homefill with OCD maintain sats >/=90%. if patient qualifies, dispense POC or homefill with OCD 1-5 pulse dose.  I spoke with the patient and he wants a POC

## 2023-11-03 ENCOUNTER — Encounter: Payer: Self-pay | Admitting: Urology

## 2023-11-03 ENCOUNTER — Ambulatory Visit (INDEPENDENT_AMBULATORY_CARE_PROVIDER_SITE_OTHER): Admitting: Urology

## 2023-11-03 VITALS — BP 97/57 | HR 97

## 2023-11-03 DIAGNOSIS — N138 Other obstructive and reflux uropathy: Secondary | ICD-10-CM | POA: Diagnosis not present

## 2023-11-03 DIAGNOSIS — N401 Enlarged prostate with lower urinary tract symptoms: Secondary | ICD-10-CM | POA: Diagnosis not present

## 2023-11-03 DIAGNOSIS — N2889 Other specified disorders of kidney and ureter: Secondary | ICD-10-CM

## 2023-11-03 DIAGNOSIS — Z87898 Personal history of other specified conditions: Secondary | ICD-10-CM | POA: Diagnosis not present

## 2023-11-03 DIAGNOSIS — R339 Retention of urine, unspecified: Secondary | ICD-10-CM

## 2023-11-03 LAB — BLADDER SCAN AMB NON-IMAGING: Scan Result: 43

## 2023-11-03 NOTE — Patient Instructions (Signed)
 A Growth in the Kidney (Renal Mass): What to Know  A renal mass is an abnormal growth in the kidney. It may be found during an MRI, CT scan, or ultrasound that's done to check for other problems in the belly. Some renal masses are cancerous, or malignant, and can grow or spread quickly. Others are benign, which means they're not cancer. Renal masses include: Tumors. These may be either cancerous or benign. The most common cancerous tumor in adults is called renal cell carcinoma. In children, the most common type is Wilms tumor. The most common kidney tumors that aren't cancer include renal adenomas, oncocytomas, and angiomyolipomas (AML). Cysts. These are pockets of fluid that form on or in the kidney. What are the causes? Certain types of cancers, infections, or injuries can cause a renal mass. It's not always known what causes a cyst to form in or on the kidney. What are the signs or symptoms? Often, a renal mass or kidney cyst doesn't cause any signs or symptoms. How is this diagnosed? Your health care provider may suggest tests to diagnose the cause of your renal mass. These tests may include: A physical exam. Blood tests. Pee (urine) tests. Imaging tests, such as: CT scan. MRI. Ultrasound. Chest X-ray or bone scan. These may be done if a tumor is cancerous to see if the cancer has spread outside the kidney. Biopsy. This is when a small piece of tissue is removed from the renal mass for testing. How is this treated? Treatment is not always needed for a renal mass. Treatment will depend on the cause of the mass and if it's causing any problems or symptoms. For a cancerous renal mass, treatment options may include: Surgery. This is done to remove the tumor and any affected tissue. Chemotherapy. This uses medicines to kill cancer cells. Radiation. High-energy X-rays or gamma rays are used to kill cancer cells. Ablation. This uses extreme hot or cold temperature to kill the cancer  cells. Immunotherapy. Medicines are used to help the body's defense system (immune system) fight the cancer cells. Taking part in clinical trials. This involves trying new or experimental treatments to see if they're effective. Most kidney cysts don't need to be treated. Follow these instructions at home: What you need to do at home will depend on the cause of the mass. Follow the instructions that your provider gives you. In general: Take medicines only as told. If you were given antibiotics, take them as told. Do not stop taking them even if you start to feel better. Follow any instructions from your provider about what you can and can't do. Do not smoke, vape, or use nicotine or tobacco. Keep all follow-up visits. Your provider will need to check if your renal mass has changed or grown. Contact a health care provider if: You have flank pain, which is pain in your side or back. You have a fever. You have a loss of appetite. You have pain or swelling in your belly. You lose weight. Get help right away if: Your pain gets worse. There's blood in your pee. You can't pee. This information is not intended to replace advice given to you by your health care provider. Make sure you discuss any questions you have with your health care provider. Document Revised: 10/25/2022 Document Reviewed: 10/25/2022 Elsevier Patient Education  2025 [employer].

## 2023-11-03 NOTE — Progress Notes (Signed)
 11/03/2023 3:40 PM   LAM MCCUBBINS 83 [birth year], 1942 [id number]  Referring provider: [name], MD [address] [place],  [place] [address]  Followup BPH and renal mass   HPI: Mr Sobieski is a 83yo here for followup for a right renal mass and BPH with urinary retention. Renal US  shows slight increase in size of his right renal mass. IPSS 4 QOL 1 off all BPH medication. Uirne stream strong. No straining to urinate. Nocturia 0-2x. PVR 43cc. He is on 3L of oxygen. He has lost 20lbs since last visit.    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    Surgical History: Past Surgical History:  Procedure Laterality Date   COLONOSCOPY     COLONOSCOPY     CORONARY ANGIOPLASTY  2010   Dr Delford   CYSTOSCOPY WITH LITHOLAPAXY N/A 02/24/2017   Procedure: CYSTOSCOPY WITH LITHOLAPAXY;  Surgeon: Sherrilee Belvie CROME, MD;  Location: [facility];  Service: Urology;  Laterality: N/A;   CYSTOSCOPY WITH URETHRAL DILATATION  02/24/2017   Procedure: CYSTOSCOPY WITH URETHRAL DILATATION;  Surgeon: Sherrilee Belvie CROME, MD;  Location: [facility];  Service: Urology;;   DOPPLER ECHOCARDIOGRAPHY     EAR CYST EXCISION Left 07/23/2012   Procedure: EXCISION LEFT EAR LOBE;  Surgeon: Norleen Notice, MD;  [facility];  Service: ENT;  Laterality: Left;   HOLMIUM LASER APPLICATION N/A 02/24/2017   Procedure: HOLMIUM LASER APPLICATION OF BLADDER CALCULI;  Surgeon: Sherrilee Belvie CROME, MD;   Location: [facility];  Service: Urology;  Laterality: N/A;   IR RADIOLOGIST EVAL & MGMT  11/28/2016    Home Medications:  Allergies as of 11/03/2023       Reactions   Ciprofloxacin  Other (See Comments)   Crestor [rosuvastatin] Other (See Comments)   Unknown    Penicillins Other (See Comments)   Per patient causes GI upset with high doses. He says he does OK with shots but gets GI upset with PO penicillins Can take small doses with no issues   Tramadol  Other (See Comments)   Confusion   Lisinopril  Rash, Cough   Patient says it caused bp to increase and caused a rash on his legs    Macrodantin  [nitrofurantoin ] Rash        Medication List        Accurate as of November 03, 2023  3:40 PM. If you have any questions, ask your nurse or doctor.          aspirin  EC 81 MG tablet Take 81 mg by mouth daily.   furosemide  40 MG tablet Commonly known as: LASIX  Take 1 tablet (40 mg total) by mouth every other day.   nitroGLYCERIN  0.4 MG SL tablet Commonly known as: NITROSTAT  Place 1 tablet (0.4 mg  total) under the tongue every 5 (five) minutes x 3 doses as needed for chest pain (if after 3rd dose no relief proceed to ED or call 911).        Allergies:  Allergies  Allergen Reactions   Ciprofloxacin  Other (See Comments)   Crestor [Rosuvastatin] Other (See Comments)    Unknown    Penicillins Other (See Comments)    Per patient causes GI upset with high doses. He says he does OK with shots but gets GI upset with PO penicillins Can take small doses with no issues   Tramadol  Other (See Comments)    Confusion   Lisinopril  Rash and Cough    Patient says it caused bp to increase and caused a rash on his legs    Macrodantin  [Nitrofurantoin ] Rash    Family History: No family history on file.  Social History:  reports that he quit smoking about 13 years ago. His smoking use included cigarettes. He started smoking about 58 years ago. He has a 67.5 pack-year smoking history. He has never  used smokeless tobacco. He reports that he does not drink alcohol and does not use drugs.  ROS: All other review of systems were reviewed and are negative except what is noted above in HPI  Physical Exam: BP (!) 97/57   Pulse 97   Constitutional:  Alert and oriented, No acute distress. HEENT: [place] AT, moist mucus membranes.  Trachea midline, no masses. Cardiovascular: No clubbing, cyanosis, or edema. Respiratory: Normal respiratory effort, no increased work of breathing. GI: Abdomen is soft, nontender, nondistended, no abdominal masses GU: No CVA tenderness.  Lymph: No cervical or inguinal lymphadenopathy. Skin: No rashes, bruises or suspicious lesions. Neurologic: Grossly intact, no focal deficits, moving all 4 extremities. Psychiatric: Normal mood and affect.  Laboratory Data: Lab Results  Component Value Date   WBC 6.1 06/11/2023   HGB 14.0 06/11/2023   HCT 40.6 06/11/2023   MCV 96.4 06/11/2023   PLT 133 (L) 06/11/2023    Lab Results  Component Value Date   CREATININE 2.93 (H) 06/11/2023    No results found for: PSA  No results found for: TESTOSTERONE  No results found for: HGBA1C  Urinalysis    Component Value Date/Time   COLORURINE YELLOW 06/09/2023 1742   APPEARANCEUR CLEAR 06/09/2023 1742   APPEARANCEUR Clear 03/05/2023 1439   LABSPEC 1.009 06/09/2023 1742   PHURINE 5.0 06/09/2023 1742   GLUCOSEU NEGATIVE 06/09/2023 1742   HGBUR LARGE (A) 06/09/2023 1742   BILIRUBINUR NEGATIVE 06/09/2023 1742   BILIRUBINUR Negative 03/05/2023 1439   KETONESUR NEGATIVE 06/09/2023 1742   PROTEINUR NEGATIVE 06/09/2023 1742   UROBILINOGEN 0.2 12/03/2020 1104   UROBILINOGEN 0.2 07/25/2012 0446   NITRITE NEGATIVE 06/09/2023 1742   LEUKOCYTESUR TRACE (A) 06/09/2023 1742    Lab Results  Component Value Date   LABMICR See below: 03/05/2023   WBCUA 0-5 03/05/2023   LABEPIT 0-10 03/05/2023   MUCUS Present (A) 03/05/2023   BACTERIA NONE SEEN 06/09/2023    Pertinent  Imaging: Renal US  08/27/2023: Images reviewed and discussed with the patient  No results found for this or any previous visit.  Results for orders placed during the hospital encounter of 05/24/23  US  Venous Img Lower Bilateral (DVT)  Narrative CLINICAL DATA:  Bilateral lower extremity edema  EXAM: BILATERAL LOWER EXTREMITY VENOUS DOPPLER ULTRASOUND  TECHNIQUE: Gray-scale sonography with graded compression, as well as color Doppler and duplex ultrasound were performed to evaluate the lower extremity deep venous systems from the  level of the common femoral vein and including the common femoral, femoral, profunda femoral, popliteal and calf veins including the posterior tibial, peroneal and gastrocnemius veins when visible. The superficial great saphenous vein was also interrogated. Spectral Doppler was utilized to evaluate flow at rest and with distal augmentation maneuvers in the common femoral, femoral and popliteal veins.  COMPARISON:  None Available.  FINDINGS: RIGHT LOWER EXTREMITY  Common Femoral Vein: No evidence of thrombus. Normal compressibility, respiratory phasicity and response to augmentation.  Saphenofemoral Junction: No evidence of thrombus. Normal compressibility and flow on color Doppler imaging.  Profunda Femoral Vein: No evidence of thrombus. Normal compressibility and flow on color Doppler imaging.  Femoral Vein: No evidence of thrombus. Normal compressibility, respiratory phasicity and response to augmentation.  Popliteal Vein: No evidence of thrombus. Normal compressibility, respiratory phasicity and response to augmentation.  Calf Veins: No evidence of thrombus. Normal compressibility and flow on color Doppler imaging.  Superficial Great Saphenous Vein: No evidence of thrombus. Normal compressibility.  Venous Reflux:  None.  Other Findings:  Extensive subcutaneous edema.  LEFT LOWER EXTREMITY  Common Femoral Vein: No evidence of thrombus.  Normal compressibility, respiratory phasicity and response to augmentation.  Saphenofemoral Junction: No evidence of thrombus. Normal compressibility and flow on color Doppler imaging.  Profunda Femoral Vein: No evidence of thrombus. Normal compressibility and flow on color Doppler imaging.  Femoral Vein: No evidence of thrombus. Normal compressibility, respiratory phasicity and response to augmentation.  Popliteal Vein: No evidence of thrombus. Normal compressibility, respiratory phasicity and response to augmentation.  Calf Veins: No evidence of thrombus. Normal compressibility and flow on color Doppler imaging.  Superficial Great Saphenous Vein: No evidence of thrombus. Normal compressibility.  Venous Reflux:  None.  Other Findings:  Extensive subcutaneous edema.  IMPRESSION: No evidence of deep venous thrombosis in either lower extremity.  Extensive bilateral lower extremity edema.   Electronically Signed By: Wilkie Lent M.D. On: 05/25/2023 13:25  No results found for this or any previous visit.  No results found for this or any previous visit.  Results for orders placed during the hospital encounter of 08/27/23  Ultrasound renal complete  Narrative CLINICAL DATA:  renal mass  EXAM: RENAL / URINARY TRACT ULTRASOUND COMPLETE  COMPARISON:  May 27, 2023  FINDINGS: Right Kidney:  Renal measurements: 11.1 x 5.9 x 4.8 cm = volume: 165 mL. Echogenicity within normal limits. No hydronephrosis visualized. There is a heterogeneous mass arising from the inferior pole of the RIGHT kidney. It demonstrates internal cystic areas which may reflect necrosis. It measures at least 9.3 x 7.8 x 8.4 cm, previously 8.1 x 8.1 x 7.0 cm. Exact measurements are difficult to ascertain due to size.  Left Kidney:  Renal measurements: 9.4 x 4.8 x 5.4 cm = volume: 127 mL. Echogenicity within normal limits. No mass or hydronephrosis visualized.  Bladder:  Appears  normal for degree of bladder distention.  Other:  Moderate ascites is noted.  IMPRESSION: 1. Continued mild increase in size of a solid mass of the RIGHT kidney most consistent with renal cell carcinoma, being followed by urology. 2. Moderate ascites.   Electronically Signed By: Corean Salter M.D. On: 09/10/2023 12:53  No results found for this or any previous visit.  No results found for this or any previous visit.  No results found for this or any previous visit.   Assessment & Plan:    1. Renal mass (Primary) Followup 6 months with renal US   2. Urinary retention -resolved -  BLADDER SCAN AMB NON-IMAGING  3. Benign prostatic hyperplasia with urinary obstruction Patient defers therapy at this time.    No follow-ups on file.  Belvie Clara, MD  [facility] [place]

## 2023-11-03 NOTE — Progress Notes (Signed)
post void residual= 43 

## 2024-04-12 ENCOUNTER — Encounter (HOSPITAL_COMMUNITY)
Admission: RE | Admit: 2024-04-12 | Discharge: 2024-04-12 | Disposition: A | Source: Ambulatory Visit | Attending: Ophthalmology

## 2024-04-12 NOTE — H&P (Signed)
 Surgical History & Physical  Patient Name: Angel [name]  DOB: 12-21-1940  Surgery: Cataract extraction with intraocular lens implant phacoemulsification; Right Eye Goniotomy treatment, iAccess; Right Eye Surgeon: Lynwood Hermann MD Surgery Date: 04/16/2024 Pre-Op Date: 03/15/2024  HPI: A 59 Yr. old male patient 1. The patient complains of difficulty when reading fine print, books, newspaper, instructions etc., which began 1 year ago. Both eyes are affected. The episode is constant. The patient describes foggy and hazy symptoms affecting their eyes/vision. The condition's severity is worsening. Symptoms occur when the patient is reading. This is negatively affecting the patient's quality of life and the patient is unable to function adequately in life with the current level of vision. HPI Completed by Dr. Lynwood Hermann  Medical History:  Heart Problem  Review of Systems Cardiovascular Congestive Heart Failure All recorded systems are negative except as noted above.  Social Former smoker   Medication Prednisolone-moxiflox-bromfen, Furosemide   Sx/Procedures None  Drug Allergies  tramadol    History & Physical: Heent: cataracts NECK: supple without bruits LUNGS: lungs clear to auscultation CV: regular rate and rhythm Abdomen: soft and non-tender  Impression & Plan: Assessment: 1. NUCLEAR SCLEROSIS AGE RELATED; Both Eyes (H25.13) 2. PRIMARY OPEN ANGLE GLAUCOMA; Both Eyes Indeterminate (H40.1134) 3. BLEPHARITIS; Right Upper Lid, Right Lower Lid, Left Upper Lid, Left Lower Lid (H01.001, H01.002,H01.004,H01.005) 4. DERMATOCHALASIS, no surgery; Right Upper Lid, Left Upper Lid (H02.831, Y97.165)  Plan: 1. Cataract accounts for the patient's decreased vision. This visual impairment is not correctable with a tolerable change in glasses or contact lenses. Cataract surgery with an implantation of a new lens should significantly improve the visual and functional status of the patient.  Recommend phacoemulsification with intraocular lens. Discussed all risks, benefits, alternatives, and potential complications. Discussed the procedures and recovery. The patient desires to have surgery. A-scan/Biometry ordered and will be performed for intraocular lens calculations. The surgery will be performed in order to improve vision for driving, reading, and for eye examinations. Recommend Dextenza  for post-operative pain and inflammation. Educational materials provided: Cataract. History of corneal refractive Surgery: None History of Previous Ocular Surgery (PPV, other): None History of ocular trauma: Right eye - positive. Use of Eye Pressure Lowering Drops: None No current contact lens use. Pupil Status: Dilates poorly - shugarcaine or Lidocaine +Omidira by protocol, Malyugin Ring Multifocal Candidate: None Right Eye first, Left Eye second. Refractive Goal: [place] Recommend Limbal Relaxing Incision to treat astigmatism. Glaucoma Treatment: Recommend goniotomy to control pressure in the setting of glaucoma/ocular hypertension.  2. Findings, prognosis and treatment options reviewed. new diagnosis. OCT rNFL shows thinning OU. Detailed discussion about glaucoma today including importance of maintaining good follow up and following treatment plan, and the possibility of irreversible blindness as part of this disease process. Recommend goniotomy (iAccess) both eyes at the time of cataract surgery to improve compliance and help lower eye pressure. Longitudinal care considerations were addressed as part of this visit related to the patient's complex and ongoing/chronic medical condition. Extra time was spent with patient discussing the importance of following medical care instructions and recommendations to help achieve positive long-term outcomes.  3. Blepharitis is present - recommend regular lid cleaning.  4. Asymptomatic, recommend observation for now. Findings, prognosis and treatment  options reviewed.

## 2024-04-12 NOTE — Pre-Procedure Instructions (Signed)
 Attempted pre-op phone call. Left VM for him to call us back.

## 2024-04-19 ENCOUNTER — Ambulatory Visit (HOSPITAL_COMMUNITY): Admission: RE | Admit: 2024-04-19 | Source: Ambulatory Visit

## 2024-04-26 ENCOUNTER — Encounter (HOSPITAL_COMMUNITY)

## 2024-04-27 ENCOUNTER — Inpatient Hospital Stay (HOSPITAL_COMMUNITY): Admission: RE | Admit: 2024-04-27 | Discharge: 2024-04-27 | Attending: Ophthalmology

## 2024-04-28 NOTE — Progress Notes (Shared)
 Attempted pre op call but no answer.  Left a voicemail to call us  back.

## 2024-04-29 NOTE — Pre-Procedure Instructions (Signed)
 Attempted pre-op phone call. Left VM for him to call us back.

## 2024-04-29 NOTE — Pre-Procedure Instructions (Signed)
 [facility] notified that we have been unable to contact patient.

## 2024-04-30 ENCOUNTER — Encounter (HOSPITAL_COMMUNITY): Payer: Self-pay | Admitting: Anesthesiology

## 2024-04-30 ENCOUNTER — Encounter (HOSPITAL_COMMUNITY): Admission: RE | Payer: Self-pay | Source: Home / Self Care

## 2024-04-30 ENCOUNTER — Ambulatory Visit (HOSPITAL_COMMUNITY): Admission: RE | Admit: 2024-04-30 | Admitting: Ophthalmology

## 2024-04-30 SURGERY — PHACOEMULSIFICATION, CATARACT, WITH IOL INSERTION
Anesthesia: Monitor Anesthesia Care | Laterality: Right

## 2024-05-10 ENCOUNTER — Encounter: Payer: Self-pay | Admitting: *Deleted

## 2024-05-12 ENCOUNTER — Ambulatory Visit: Admitting: Urology

## 2024-05-13 DEATH — deceased

## 2024-05-26 ENCOUNTER — Other Ambulatory Visit (HOSPITAL_COMMUNITY)

## 2024-05-28 ENCOUNTER — Ambulatory Visit (HOSPITAL_COMMUNITY): Admit: 2024-05-28 | Admitting: Ophthalmology

## 2024-05-28 SURGERY — PHACOEMULSIFICATION, CATARACT, WITH IOL INSERTION
Anesthesia: Monitor Anesthesia Care | Laterality: Left
